# Patient Record
Sex: Female | Born: 1995 | Race: White | Hispanic: No | Marital: Married | Smoking: Never smoker
Health system: Southern US, Community
[De-identification: ages and names within clinical notes are randomized; demographics above are authoritative.]

## PROBLEM LIST (undated history)

## (undated) DIAGNOSIS — N189 Chronic kidney disease, unspecified: Secondary | ICD-10-CM

## (undated) DIAGNOSIS — D649 Anemia, unspecified: Secondary | ICD-10-CM

## (undated) HISTORY — DX: Chronic kidney disease, unspecified: N18.9

## (undated) HISTORY — PX: NO PAST SURGERIES: SHX2092

## (undated) HISTORY — DX: Anemia, unspecified: D64.9

---

## 2019-11-19 NOTE — L&D Delivery Note (Addendum)
OB/GYN Faculty Practice Delivery Note  Pamela Doyle is a 24 y.o. G1P1 s/p SVD @0045  at [redacted]w[redacted]d. She was admitted for PROM.   PROM: @2000  8/25 at patient home.  GBS Status: Negative Maximum Maternal Temperature: 98.65F  Labor Progress: Patient received labor augmentation with Pitocin. Progressed well. FHT with recurrent decels with pushing during contractions, but reassuringly rapid recovery between contractions with good variability.  Delivery Date/Time: 07/14/2020 at 0045 Delivery: Called to room and patient was complete and pushing. Head delivered ROA. No nuchal cord present. Shoulder and body delivered in usual fashion. Infant with spontaneous cry, placed on mother's abdomen, dried and stimulated. Cord clamped x 2 after 1-minute delay, and cut by FOB under my direct supervision. Cord blood drawn. Placenta delivered spontaneously with gentle cord traction. Fundus firm with massage and Pitocin. Labia, perineum, vagina, and cervix were inspected, no lacerations noted.   Placenta: Intact, 3-vessel cord Complications: None Lacerations: None EBL: 9/25 Analgesia: Epidural  Infant: boy  APGARs 7, 9  weight pending  07/16/2020 PGY-1 Family Medicine   Attestation of Supervision of Student:  I confirm that I have verified the information documented in the  resident's note and that I have also personally reperformed the history, physical exam and all medical decision making activities.  I have verified that all services and findings are accurately documented in this student's note; and I agree with management and plan as outlined in the documentation. I have also made any necessary editorial changes.  , MD Center for [facility], [facility] 07/14/2020 1:32 AM

## 2019-12-22 ENCOUNTER — Encounter: Payer: Self-pay | Admitting: Advanced Practice Midwife

## 2019-12-22 ENCOUNTER — Ambulatory Visit (INDEPENDENT_AMBULATORY_CARE_PROVIDER_SITE_OTHER): Payer: Medicaid Other | Admitting: Advanced Practice Midwife

## 2019-12-22 ENCOUNTER — Other Ambulatory Visit (HOSPITAL_COMMUNITY)
Admission: RE | Admit: 2019-12-22 | Discharge: 2019-12-22 | Disposition: A | Discharge status: Home / Self Care | Payer: Medicaid Other | Source: Ambulatory Visit | Attending: Advanced Practice Midwife | Admitting: Advanced Practice Midwife

## 2019-12-22 ENCOUNTER — Other Ambulatory Visit: Payer: Self-pay

## 2019-12-22 VITALS — BP 122/81 | HR 80 | Ht 68.0 in | Wt 195.0 lb

## 2019-12-22 DIAGNOSIS — Z3400 Encounter for supervision of normal first pregnancy, unspecified trimester: Secondary | ICD-10-CM

## 2019-12-22 DIAGNOSIS — Z3481 Encounter for supervision of other normal pregnancy, first trimester: Secondary | ICD-10-CM | POA: Diagnosis present

## 2019-12-22 DIAGNOSIS — Z0282 Encounter for adoption services: Secondary | ICD-10-CM

## 2019-12-22 DIAGNOSIS — Z3A11 11 weeks gestation of pregnancy: Secondary | ICD-10-CM

## 2019-12-22 DIAGNOSIS — Z3401 Encounter for supervision of normal first pregnancy, first trimester: Secondary | ICD-10-CM | POA: Diagnosis not present

## 2019-12-22 MED ORDER — BLOOD PRESSURE KIT: 1.0000 | PACK | 0 refills | Status: DC

## 2019-12-22 NOTE — Progress Notes (Signed)
History:   Allysen Lazo is a 24 y.o. G1P0 at [redacted]w[redacted]d by LMP c/w ultrasound in clinic today. She is being seen today for her first obstetrical visit.  No significant obstetric or gyn history. Patient does intend to breast feed. Pregnancy history fully reviewed.  Patient reports fatigue. 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 She is a non-smoker, husband is supportive, first pregnancy for both of them. Patient is adopted. Denies SI, HI, IPV   HISTORY: OB History  Gravida Para Term Preterm AB Living  1 0 0 0 0 0  SAB TAB Ectopic Multiple Live Births  0 0 0 0 0    # Outcome Date GA Lbr Len/2nd Weight Sex Delivery Anes PTL Lv  1 Current             Last pap smear was done 3 years ago and was normal  Past Medical History:  Diagnosis Date  . Chronic kidney disease    mal rotated kidneys   Past Surgical History:  Procedure Laterality Date  . NO PAST SURGERIES     Family History  Adopted: Yes   Social History   Tobacco Use  . Smoking status: Never Smoker  . Smokeless tobacco: Never Used  Substance Use Topics  . Alcohol use: Not Currently  . Drug use: Not Currently   No Known Allergies Current Outpatient Medications on File Prior to Visit  Medication Sig Dispense Refill  . Multiple Vitamins-Minerals (MULTIVITAMIN WITH MINERALS) tablet Take 1 tablet by mouth daily.    . Prenatal Vit-Fe Fumarate-FA (MULTIVITAMIN-PRENATAL) 27-0.8 MG TABS tablet Take 1 tablet by mouth daily at 12 noon.     No current facility-administered medications on file prior to visit.    Review of Systems Pertinent items noted in HPI and remainder of comprehensive ROS otherwise negative. Physical Exam:   Vitals:   12/22/19 1040 12/22/19 1041  BP: 122/81   Pulse: 80   Weight: 195 lb (88.5 kg)   Height:  5\' 8"  (1.727 m)   Fetal Heart Rate (bpm): 162 Uterus:     Pelvic Exam: Perineum: no hemorrhoids, normal perineum   Vulva: normal external genitalia, no lesions    Vagina:  normal mucosa, normal discharge   Cervix: no lesions and normal, pap smear done.    Adnexa: normal adnexa and no mass, fullness, tenderness   Bony Pelvis: average  System: General: well-developed, well-nourished female in no acute distress   Breasts:  normal appearance, no masses or tenderness bilaterally   Skin: normal coloration and turgor, no rashes   Neurologic: oriented, normal, negative, normal mood   Extremities: normal strength, tone, and muscle mass, ROM of all joints is normal   HEENT PERRLA, extraocular movement intact and sclera clear, anicteric   Mouth/Teeth mucous membranes moist, pharynx normal without lesions and dental hygiene good   Neck supple and no masses   Cardiovascular: regular rate and rhythm   Respiratory:  no respiratory distress, normal breath sounds   Abdomen: soft, non-tender; bowel sounds normal; no masses,  no organomegaly  Bedside Ultrasound for FHR check: Patient informed that the ultrasound is considered a limited obstetric ultrasound and is not intended to be a complete ultrasound exam.  Patient also informed that the ultrasound is not being completed with the intent of assessing for fetal or placental anomalies or any pelvic abnormalities.  Explained that the purpose of today's ultrasound is to assess for fetal heart rate.  Patient acknowledges the purpose  of the exam and the limitations of the study.     Assessment:    Pregnancy: G1P0 There are no problems to display for this patient.    Plan:    1. Encounter for supervision of other normal pregnancy in first trimester - Routine care - Obstetric Panel, Including HIV - Culture, OB Urine - Cytology - PAP - [place] MFM OB COMP + 14 WK; Future - Enroll Patient in Babyscripts - Babyscripts Schedule Optimization  2. Adopted - Genetic Screening  Initial labs drawn. Continue prenatal vitamins. Genetic Screening discussed, First trimester screen, Quad screen and NIPS: ordered. Ultrasound  discussed; fetal anatomic survey: ordered. Problem list reviewed and updated. The nature of [place] with multiple MDs and other Advanced Practice Providers was explained to patient; also emphasized that residents, students are part of our team. Routine obstetric precautions reviewed. Return in about 4 weeks (around 01/19/2020).     Mallie Snooks, MSN, CNM Certified Nurse Midwife, [facility] for [employer], [place] [facility] 12/22/19 12:33 PM

## 2019-12-22 NOTE — Progress Notes (Signed)
DATING AND VIABILITY SONOGRAM   Karolynn Infantino is a 24 y.o. year old G1P0 with LMP Patient's last menstrual period was 10/05/2019. which would correlate to  [redacted]w[redacted]d weeks gestation.  She has regular menstrual cycles.   She is here today for a confirmatory initial sonogram.    GESTATION: SINGLETON yes     FETAL ACTIVITY:          Heart rate   162          The fetus is active.     GESTATIONAL AGE AND  BIOMETRICS:  Gestational criteria: Estimated Date of Delivery: 07/11/20 by LMP now at [redacted]w[redacted]d  Previous Scans:0  GESTATIONAL SAC            mm          weeks  CROWN RUMP LENGTH           4.66 mm         11.3 weeks                                                   AVERAGE EGA(BY THIS SCAN): 11.3 weeks  WORKING EDD( LMP ):  07/11/2020     TECHNICIAN COMMENTS:  Patient informed that the ultrasound is considered a limited obstetric ultrasound and is not intended to be a complete ultrasound exam. Patient also informed that the ultrasound is not being completed with the intent of assessing for fetal or placental anomalies or any pelvic abnormalities. Explained that the purpose of today's ultrasound is to assess for fetal heart rate. Patient acknowledges the purpose of the exam and the limitations of the study.       A copy of this report including all images has been saved and backed up to a second source for retrieval if needed. All measures and details of the anatomical scan, placentation, fluid volume and pelvic anatomy are contained in that report.  Scheryl Marten 12/22/2019 10:49 AM

## 2019-12-22 NOTE — Progress Notes (Signed)
Last Pap 3 years ago

## 2019-12-22 NOTE — Patient Instructions (Signed)
First Trimester of Pregnancy  The first trimester of pregnancy is from week 1 until the end of week 13 (months 1 through 3). During this time, your baby will begin to develop inside you. At 6-8 weeks, the eyes and face are formed, and the heartbeat can be seen on ultrasound. At the end of 12 weeks, all the baby's organs are formed. Prenatal care is all the medical care you receive before the birth of your baby. Make sure you get good prenatal care and follow all of your doctor's instructions. Follow these instructions at home: Medicines  Take over-the-counter and prescription medicines only as told by your doctor. Some medicines are safe and some medicines are not safe during pregnancy.  Take a prenatal vitamin that contains at least 600 micrograms (mcg) of folic acid.  If you have trouble pooping (constipation), take medicine that will make your stool soft (stool softener) if your doctor approves. Eating and drinking   Eat regular, healthy meals.  Your doctor will tell you the amount of weight gain that is right for you.  Avoid raw meat and uncooked cheese.  If you feel sick to your stomach (nauseous) or throw up (vomit): ? Eat 4 or 5 small meals a day instead of 3 large meals. ? Try eating a few soda crackers. ? Drink liquids between meals instead of during meals.  To prevent constipation: ? Eat foods that are high in fiber, like fresh fruits and vegetables, whole grains, and beans. ? Drink enough fluids to keep your pee (urine) clear or pale yellow. Activity  Exercise only as told by your doctor. Stop exercising if you have cramps or pain in your lower belly (abdomen) or low back.  Do not exercise if it is too hot, too humid, or if you are in a place of great height (high altitude).  Try to avoid standing for long periods of time. Move your legs often if you must stand in one place for a long time.  Avoid heavy lifting.  Wear low-heeled shoes. Sit and stand up  straight.  You can have sex unless your doctor tells you not to. Relieving pain and discomfort  Wear a good support bra if your breasts are sore.  Take warm water baths (sitz baths) to soothe pain or discomfort caused by hemorrhoids. Use hemorrhoid cream if your doctor says it is okay.  Rest with your legs raised if you have leg cramps or low back pain.  If you have puffy, bulging veins (varicose veins) in your legs: ? Wear support hose or compression stockings as told by your doctor. ? Raise (elevate) your feet for 15 minutes, 3-4 times a day. ? Limit salt in your food. Prenatal care  Schedule your prenatal visits by the twelfth week of pregnancy.  Write down your questions. Take them to your prenatal visits.  Keep all your prenatal visits as told by your doctor. This is important. Safety  Wear your seat belt at all times when driving.  Make a list of emergency phone numbers. The list should include numbers for family, friends, the hospital, and police and fire departments. General instructions  Ask your doctor for a referral to a local prenatal class. Begin classes no later than at the start of month 6 of your pregnancy.  Ask for help if you need counseling or if you need help with nutrition. Your doctor can give you advice or tell you where to go for help.  Do not use hot tubs, steam   rooms, or saunas.  Do not douche or use tampons or scented sanitary pads.  Do not cross your legs for long periods of time.  Avoid all herbs and alcohol. Avoid drugs that are not approved by your doctor.  Do not use any tobacco products, including cigarettes, chewing tobacco, and electronic cigarettes. If you need help quitting, ask your doctor. You may get counseling or other support to help you quit.  Avoid cat litter boxes and soil used by cats. These carry germs that can cause birth defects in the baby and can cause a loss of your baby (miscarriage) or stillbirth.  Visit your dentist.  At home, brush your teeth with a soft toothbrush. Be gentle when you floss. Contact a doctor if:  You are dizzy.  You have mild cramps or pressure in your lower belly.  You have a nagging pain in your belly area.  You continue to feel sick to your stomach, you throw up, or you have watery poop (diarrhea).  You have a bad smelling fluid coming from your vagina.  You have pain when you pee (urinate).  You have increased puffiness (swelling) in your face, hands, legs, or ankles. Get help right away if:  You have a fever.  You are leaking fluid from your vagina.  You have spotting or bleeding from your vagina.  You have very bad belly cramping or pain.  You gain or lose weight rapidly.  You throw up blood. It may look like coffee grounds.  You are around people who have [place] measles, fifth disease, or chickenpox.  You have a very bad headache.  You have shortness of breath.  You have any kind of trauma, such as from a fall or a car accident. Summary  The first trimester of pregnancy is from week 1 until the end of week 13 (months 1 through 3).  To take care of yourself and your unborn baby, you will need to eat healthy meals, take medicines only if your doctor tells you to do so, and do activities that are safe for you and your baby.  Keep all follow-up visits as told by your doctor. This is important as your doctor will have to ensure that your baby is healthy and growing well. This information is not intended to replace advice given to you by your health care provider. Make sure you discuss any questions you have with your health care provider. Document Revised: 02/25/2019 Document Reviewed: 11/12/2016 Elsevier Patient Education  2020 [employer].   Second Trimester of Pregnancy  The second trimester is from week 14 through week 27 (month 4 through 6). This is often the time in pregnancy that you feel your best. Often times, morning sickness has lessened or quit.  You may have more energy, and you may get hungry more often. Your unborn baby is growing rapidly. At the end of the sixth month, he or she is about 9 inches long and weighs about 1 pounds. You will likely feel the baby move between 18 and 20 weeks of pregnancy. Follow these instructions at home: Medicines  Take over-the-counter and prescription medicines only as told by your doctor. Some medicines are safe and some medicines are not safe during pregnancy.  Take a prenatal vitamin that contains at least 600 micrograms (mcg) of folic acid.  If you have trouble pooping (constipation), take medicine that will make your stool soft (stool softener) if your doctor approves. Eating and drinking   Eat regular, healthy meals.  Avoid  raw meat and uncooked cheese.  If you get low calcium from the food you eat, talk to your doctor about taking a daily calcium supplement.  Avoid foods that are high in fat and sugars, such as fried and sweet foods.  If you feel sick to your stomach (nauseous) or throw up (vomit): ? Eat 4 or 5 small meals a day instead of 3 large meals. ? Try eating a few soda crackers. ? Drink liquids between meals instead of during meals.  To prevent constipation: ? Eat foods that are high in fiber, like fresh fruits and vegetables, whole grains, and beans. ? Drink enough fluids to keep your pee (urine) clear or pale yellow. Activity  Exercise only as told by your doctor. Stop exercising if you start to have cramps.  Do not exercise if it is too hot, too humid, or if you are in a place of great height (high altitude).  Avoid heavy lifting.  Wear low-heeled shoes. Sit and stand up straight.  You can continue to have sex unless your doctor tells you not to. Relieving pain and discomfort  Wear a good support bra if your breasts are tender.  Take warm water baths (sitz baths) to soothe pain or discomfort caused by hemorrhoids. Use hemorrhoid cream if your doctor  approves.  Rest with your legs raised if you have leg cramps or low back pain.  If you develop puffy, bulging veins (varicose veins) in your legs: ? Wear support hose or compression stockings as told by your doctor. ? Raise (elevate) your feet for 15 minutes, 3-4 times a day. ? Limit salt in your food. Prenatal care  Write down your questions. Take them to your prenatal visits.  Keep all your prenatal visits as told by your doctor. This is important. Safety  Wear your seat belt when driving.  Make a list of emergency phone numbers, including numbers for family, friends, the hospital, and police and fire departments. General instructions  Ask your doctor about the right foods to eat or for help finding a counselor, if you need these services.  Ask your doctor about local prenatal classes. Begin classes before month 6 of your pregnancy.  Do not use hot tubs, steam rooms, or saunas.  Do not douche or use tampons or scented sanitary pads.  Do not cross your legs for long periods of time.  Visit your dentist if you have not done so. Use a soft toothbrush to brush your teeth. Floss gently.  Avoid all smoking, herbs, and alcohol. Avoid drugs that are not approved by your doctor.  Do not use any products that contain nicotine or tobacco, such as cigarettes and e-cigarettes. If you need help quitting, ask your doctor.  Avoid cat litter boxes and soil used by cats. These carry germs that can cause birth defects in the baby and can cause a loss of your baby (miscarriage) or stillbirth. Contact a doctor if:  You have mild cramps or pressure in your lower belly.  You have pain when you pee (urinate).  You have bad smelling fluid coming from your vagina.  You continue to feel sick to your stomach (nauseous), throw up (vomit), or have watery poop (diarrhea).  You have a nagging pain in your belly area.  You feel dizzy. Get help right away if:  You have a fever.  You are leaking  fluid from your vagina.  You have spotting or bleeding from your vagina.  You have severe belly cramping or pain.  You lose or gain weight rapidly.  You have trouble catching your breath and have chest pain.  You notice sudden or extreme puffiness (swelling) of your face, hands, ankles, feet, or legs.  You have not felt the baby move in over an hour.  You have severe headaches that do not go away when you take medicine.  You have trouble seeing. Summary  The second trimester is from week 14 through week 27 (months 4 through 6). This is often the time in pregnancy that you feel your best.  To take care of yourself and your unborn baby, you will need to eat healthy meals, take medicines only if your doctor tells you to do so, and do activities that are safe for you and your baby.  Call your doctor if you get sick or if you notice anything unusual about your pregnancy. Also, call your doctor if you need help with the right food to eat, or if you want to know what activities are safe for you. This information is not intended to replace advice given to you by your health care provider. Make sure you discuss any questions you have with your health care provider. Document Revised: 02/26/2019 Document Reviewed: 12/10/2016 Elsevier Patient Education  [place] Medications in Pregnancy   Acne: Benzoyl Peroxide Salicylic Acid  Backache/Headache: Tylenol: 2 regular strength every 4 hours OR              2 Extra strength every 6 hours  Colds/Coughs/Allergies: Benadryl (alcohol free) 25 mg every 6 hours as needed Breath right strips Claritin Cepacol throat lozenges Chloraseptic throat spray Cold-Eeze- up to three times per day Cough drops, alcohol free Flonase (by prescription only) Guaifenesin Mucinex Robitussin DM (plain only, alcohol free) Saline nasal spray/drops Sudafed (pseudoephedrine) & Actifed ** use only after [redacted] weeks gestation and if you do not have  high blood pressure Tylenol Vicks Vaporub Zinc lozenges Zyrtec   Constipation: Colace Ducolax suppositories Fleet enema Glycerin suppositories Metamucil Milk of magnesia Miralax Senokot Smooth move tea  Diarrhea: Kaopectate Imodium A-D  *NO pepto Bismol  Hemorrhoids: Anusol Anusol HC Preparation H Tucks  Indigestion: Tums Maalox Mylanta Zantac  Pepcid  Insomnia: Benadryl (alcohol free) 25mg  every 6 hours as needed Tylenol PM Unisom, no Gelcaps  Leg Cramps: Tums MagGel  Nausea/Vomiting:  Bonine Dramamine Emetrol Ginger extract Sea bands Meclizine  Nausea medication to take during pregnancy:  Unisom (doxylamine succinate 25 mg tablets) Take one tablet daily at bedtime. If symptoms are not adequately controlled, the dose can be increased to a maximum recommended dose of two tablets daily (1/2 tablet in the morning, 1/2 tablet mid-afternoon and one at bedtime). Vitamin B6 100mg  tablets. Take one tablet twice a day (up to 200 mg per day).  Skin Rashes: Aveeno products Benadryl cream or 25mg  every 6 hours as needed Calamine Lotion 1% cortisone cream  Yeast infection: Gyne-lotrimin 7 Monistat 7   **If taking multiple medications, please check labels to avoid duplicating the same active ingredients **take medication as directed on the label ** Do not exceed 4000 mg of tylenol in 24 hours **Do not take medications that contain aspirin or ibuprofen

## 2019-12-23 ENCOUNTER — Encounter: Payer: Self-pay | Admitting: Advanced Practice Midwife

## 2019-12-23 DIAGNOSIS — Z3400 Encounter for supervision of normal first pregnancy, unspecified trimester: Secondary | ICD-10-CM | POA: Insufficient documentation

## 2019-12-23 DIAGNOSIS — R768 Other specified abnormal immunological findings in serum: Secondary | ICD-10-CM

## 2019-12-23 HISTORY — DX: Other specified abnormal immunological findings in serum: R76.8

## 2019-12-23 LAB — OBSTETRIC PANEL, INCLUDING HIV
Antibody Screen: NEGATIVE
Basophils Absolute: 0 10*3/uL (ref 0.0–0.2)
Basos: 1 %
EOS (ABSOLUTE): 0.1 10*3/uL (ref 0.0–0.4)
Eos: 1 %
HIV Screen 4th Generation wRfx: NONREACTIVE
Hematocrit: 37.9 % (ref 34.0–46.6)
Hemoglobin: 13.4 g/dL (ref 11.1–15.9)
Hepatitis B Surface Ag: NEGATIVE
Immature Grans (Abs): 0 10*3/uL (ref 0.0–0.1)
Immature Granulocytes: 0 %
Lymphocytes Absolute: 1.3 10*3/uL (ref 0.7–3.1)
Lymphs: 17 %
MCH: 31.8 pg (ref 26.6–33.0)
MCHC: 35.4 g/dL (ref 31.5–35.7)
MCV: 90 fL (ref 79–97)
Monocytes Absolute: 0.5 10*3/uL (ref 0.1–0.9)
Monocytes: 6 %
Neutrophils Absolute: 5.8 10*3/uL (ref 1.4–7.0)
Neutrophils: 75 %
Platelets: 148 10*3/uL — ABNORMAL LOW (ref 150–450)
RBC: 4.22 x10E6/uL (ref 3.77–5.28)
RDW: 12.1 % (ref 11.7–15.4)
RPR Ser Ql: REACTIVE — AB
Rh Factor: POSITIVE
Rubella Antibodies, IGG: 1.04 index (ref >0.99)
WBC: 7.7 10*3/uL (ref 3.4–10.8)

## 2019-12-23 LAB — CYTOLOGY - PAP
Chlamydia: NEGATIVE
Comment: NEGATIVE
Comment: NORMAL
Diagnosis: NEGATIVE
Neisseria Gonorrhea: NEGATIVE

## 2019-12-23 LAB — RPR, QUANT+TP ABS (REFLEX)
Rapid Plasma Reagin, Quant: 1:1 {titer} — ABNORMAL HIGH
T Pallidum Abs: NONREACTIVE

## 2019-12-26 LAB — URINE CULTURE, OB REFLEX

## 2019-12-26 LAB — CULTURE, OB URINE

## 2020-01-03 ENCOUNTER — Telehealth: Payer: Self-pay | Admitting: Radiology

## 2020-01-03 ENCOUNTER — Encounter: Payer: Self-pay | Admitting: Radiology

## 2020-01-03 NOTE — Telephone Encounter (Signed)
Patient was informed of panorama results 

## 2020-01-17 ENCOUNTER — Telehealth: Payer: Self-pay

## 2020-01-17 ENCOUNTER — Telehealth: Payer: Self-pay | Admitting: *Deleted

## 2020-01-17 ENCOUNTER — Encounter: Payer: Self-pay | Admitting: *Deleted

## 2020-01-17 DIAGNOSIS — Z3481 Encounter for supervision of other normal pregnancy, first trimester: Secondary | ICD-10-CM

## 2020-01-17 NOTE — Telephone Encounter (Signed)
Pt informed of carrier screen results, order placed for partner to get tested and pt okay with referral to genetic counselor.

## 2020-01-17 NOTE — Telephone Encounter (Signed)
Left message to call back concerning horizon

## 2020-01-19 ENCOUNTER — Ambulatory Visit (INDEPENDENT_AMBULATORY_CARE_PROVIDER_SITE_OTHER): Payer: Medicaid Other | Admitting: Advanced Practice Midwife

## 2020-01-19 ENCOUNTER — Other Ambulatory Visit: Payer: Self-pay

## 2020-01-19 VITALS — BP 121/78 | HR 72 | Wt 191.2 lb

## 2020-01-19 DIAGNOSIS — O285 Abnormal chromosomal and genetic finding on antenatal screening of mother: Secondary | ICD-10-CM

## 2020-01-19 DIAGNOSIS — Z3400 Encounter for supervision of normal first pregnancy, unspecified trimester: Secondary | ICD-10-CM

## 2020-01-19 NOTE — Progress Notes (Signed)
   PRENATAL VISIT NOTE  Subjective:  Pamela Doyle is a 24 y.o. G1P0 at [redacted]w[redacted]d being seen today for ongoing prenatal care.  She is currently monitored for the following issues for this low-risk pregnancy and has Supervision of normal first pregnancy, antepartum; Biological false positive RPR test; and Abnormal genetic test during pregnancy on their problem list.  Patient reports no complaints.  Contractions: Not present.  .  Movement: Absent. Denies leaking of fluid. Patient has questions about her weight fluctuations. She is monitoring her weight from multiple scales and one of those scales indicates thirteen pound weight loss. She denies nausea, vomiting, food intolerance, change in PO intake.  The following portions of the patient's history were reviewed and updated as appropriate: allergies, current medications, past family history, past medical history, past social history, past surgical history and problem list. Problem list updated.  Objective:   Vitals:   01/19/20 0915  BP: 121/78  Pulse: 72  Weight: 86.7 kg    Fetal Status: Fetal Heart Rate (bpm): 154   Movement: Absent     General:  Alert, oriented and cooperative. Patient is in no acute distress.  Skin: Skin is warm and dry. No rash noted.   Cardiovascular: Normal heart rate noted  Respiratory: Normal respiratory effort, no problems with respiration noted  Abdomen: Soft, gravid, appropriate for gestational age.  Pain/Pressure: Absent     Pelvic: Cervical exam deferred        Extremities: Normal range of motion.     Mental Status: Normal mood and affect. Normal behavior. Normal judgment and thought content.   Assessment and Plan:  Pregnancy: G1P0 at [redacted]w[redacted]d  1. Supervision of normal first pregnancy, antepartum - Routine care - 2kg loss from previous visit  2. Abnormal genetic test during pregnancy - Referred to genetic counselor - Reassured Pamela Doyle will assist with concern about out of pocket cost for partner carrier  screening  Preterm labor symptoms and general obstetric precautions including but not limited to vaginal bleeding, contractions, leaking of fluid and fetal movement were reviewed in detail with the patient. Please refer to After Visit Summary for other counseling recommendations.  Return in about 4 weeks (around 02/16/2020) for Virtual.  Future Appointments  Date Time Provider Department Center  02/15/2020  9:30 AM [facility] [place] 1 [facility]  02/16/2020  9:30 AM Calvert Cantor, CNM [facility]    Calvert Cantor, [place]

## 2020-01-19 NOTE — Patient Instructions (Signed)

## 2020-02-15 ENCOUNTER — Other Ambulatory Visit: Payer: Self-pay

## 2020-02-15 ENCOUNTER — Other Ambulatory Visit: Payer: Self-pay | Admitting: Advanced Practice Midwife

## 2020-02-15 ENCOUNTER — Ambulatory Visit (HOSPITAL_COMMUNITY)
Admission: RE | Admit: 2020-02-15 | Discharge: 2020-02-15 | Disposition: A | Discharge status: Home / Self Care | Payer: Medicaid Other | Source: Ambulatory Visit | Attending: Obstetrics and Gynecology | Admitting: Obstetrics and Gynecology

## 2020-02-15 ENCOUNTER — Other Ambulatory Visit (HOSPITAL_COMMUNITY): Payer: Self-pay | Admitting: *Deleted

## 2020-02-15 DIAGNOSIS — Z3481 Encounter for supervision of other normal pregnancy, first trimester: Secondary | ICD-10-CM

## 2020-02-15 DIAGNOSIS — Z141 Cystic fibrosis carrier: Secondary | ICD-10-CM

## 2020-02-15 DIAGNOSIS — Z3A19 19 weeks gestation of pregnancy: Secondary | ICD-10-CM

## 2020-02-15 DIAGNOSIS — Z363 Encounter for antenatal screening for malformations: Secondary | ICD-10-CM

## 2020-02-16 ENCOUNTER — Telehealth (INDEPENDENT_AMBULATORY_CARE_PROVIDER_SITE_OTHER): Payer: Medicaid Other | Admitting: Advanced Practice Midwife

## 2020-02-16 VITALS — BP 123/72

## 2020-02-16 DIAGNOSIS — Z3A19 19 weeks gestation of pregnancy: Secondary | ICD-10-CM

## 2020-02-16 DIAGNOSIS — O285 Abnormal chromosomal and genetic finding on antenatal screening of mother: Secondary | ICD-10-CM

## 2020-02-16 DIAGNOSIS — O99612 Diseases of the digestive system complicating pregnancy, second trimester: Secondary | ICD-10-CM

## 2020-02-16 DIAGNOSIS — Z3400 Encounter for supervision of normal first pregnancy, unspecified trimester: Secondary | ICD-10-CM

## 2020-02-16 DIAGNOSIS — K649 Unspecified hemorrhoids: Secondary | ICD-10-CM

## 2020-02-16 MED ORDER — WITCH HAZEL-GLYCERIN EX PADS: 1.0000 "application " | MEDICATED_PAD | CUTANEOUS | 12 refills | Status: DC | PRN

## 2020-02-16 MED ORDER — POLYETHYLENE GLYCOL 3350 17 G PO PACK: 17.0000 g | PACK | Freq: Every day | ORAL | 0 refills | Status: DC

## 2020-02-16 MED ORDER — DOCUSATE SODIUM 100 MG PO CAPS: 100.0000 mg | ORAL_CAPSULE | Freq: Two times a day (BID) | ORAL | 2 refills | Status: DC | PRN

## 2020-02-16 NOTE — Progress Notes (Signed)
I connected with  Pamela Doyle on 02/16/20 at  9:30 AM EDT by telephone and verified that I am speaking with the correct person using two identifiers.   I discussed the limitations, risks, security and privacy concerns of performing an evaluation and management service by telephone and the availability of in person appointments. I also discussed with the patient that there may be a patient responsible charge related to this service. The patient expressed understanding and agreed to proceed.  Scheryl Marten, RN 02/16/2020  9:28 AM

## 2020-02-16 NOTE — Progress Notes (Signed)
OBSTETRICS PRENATAL VIRTUAL VISIT ENCOUNTER NOTE  Provider location: [facility]   I connected with Pamela Doyle on 02/16/20 at  9:30 AM EDT by MyChart Video Encounter at home and verified that I am speaking with the correct person using two identifiers.   I discussed the limitations, risks, security and privacy concerns of performing an evaluation and management service virtually and the availability of in person appointments. I also discussed with the patient that there may be a patient responsible charge related to this service. The patient expressed understanding and agreed to proceed. Subjective:  Pamela Doyle is a 24 y.o. G1P0 at [redacted]w[redacted]d being seen today for ongoing prenatal care.  She is currently monitored for the following issues for this low-risk pregnancy and has Supervision of normal first pregnancy, antepartum; Biological false positive RPR test; and Abnormal genetic test during pregnancy on their problem list.  Patient reports new onset hemorrhoids with one episode of bleeding while wiping after a bowel movement..  Contractions: Not present. Vag. Bleeding: None. Denies any leaking of fluid.   The following portions of the patient's history were reviewed and updated as appropriate: allergies, current medications, past family history, past medical history, past social history, past surgical history and problem list.   Objective:   Vitals:   02/16/20 0924  BP: 123/72    General:  Alert, oriented and cooperative. Patient is in no acute distress.  Respiratory: Normal respiratory effort, no problems with respiration noted  Mental Status: Normal mood and affect. Normal behavior. Normal judgment and thought content.  Rest of physical exam deferred due to type of encounter  Imaging: [place] MFM OB DETAIL +14 WK  Result Date: 02/15/2020 ----------------------------------------------------------------------  OBSTETRICS REPORT                        (Signed Final 02/15/2020 01:00 pm) ---------------------------------------------------------------------- Patient Info  ID #:       [id number]                          D.O.B.:  [birth year] (24 yrs)  Name:       Pamela Doyle                 Visit Date: 02/15/2020 09:37 am ---------------------------------------------------------------------- Performed By  Performed By:     Tommie Raymond BS,       Ref. Address:     [address]                    [address]                                                             [address]  Attending:        Noralee Space MD        [facility]  Referred By:      [facility] ---------------------------------------------------------------------- Orders   #  Description                          Code         Ordered By   1  US MFM OB DETAIL +14 WK              [id number]     Clayton BiblesSAMANTHA                                                        Nickolette Espinola  ----------------------------------------------------------------------   #  Order #                    Accession #                 Episode #   1  [id number]                  [id number]                  [id number]  ---------------------------------------------------------------------- Indications   [redacted] weeks gestation of pregnancy                Z3A.19   Encounter for antenatal screening for          Z36.3   malformations   Cystic Fibrosis (CF) Carrier, second trimester O09.892  ---------------------------------------------------------------------- Fetal Evaluation  Num Of Fetuses:         1  Fetal Heart Rate(bpm):  151  Cardiac Activity:       Observed  Presentation:           Cephalic  Placenta:               Anterior  P. Cord Insertion:      Visualized, central  Amniotic Fluid  AFI FV:      Within normal limits                              Largest Pocket(cm)                              7.3  ---------------------------------------------------------------------- Biometry  BPD:      44.8  mm     G. Age:  19w 4d         74  %    CI:        78.27   %    70 - 86                                                          FL/HC:      17.4   %    16.1 - 18.3  HC:      160.2  mm     G. Age:  18w 6d         35  %    HC/AC:      1.06  1.09 - 1.39  AC:      150.9  mm     G. Age:  20w 2d         85  %    FL/BPD:     62.1   %  FL:       27.8  mm     G. Age:  18w 4d         26  %    FL/AC:      18.4   %    20 - 24  HUM:      28.3  mm     G. Age:  19w 1d         53  %  CER:      19.5  mm     G. Age:  18w 5d         43  %  NFT:       3.7  mm  LV:        7.8  mm  CM:        5.8  mm  Est. FW:     292  gm    0 lb 10 oz      71  % ---------------------------------------------------------------------- OB History  Blood Type:    B+  Gravidity:    1         Term:   0        Prem:   0        SAB:   0  TOP:          0       Ectopic:  0        Living: 0 ---------------------------------------------------------------------- Gestational Age  LMP:           19w 0d        Date:  10/05/19                 EDD:   07/11/20  U/S Today:     19w 2d                                        EDD:   07/09/20  Best:          19w 0d     Det. By:  LMP  (10/05/19)          EDD:   07/11/20 ---------------------------------------------------------------------- Anatomy  Cranium:               Appears normal         LVOT:                   Appears normal  Cavum:                 Appears normal         Aortic Arch:            Not well visualized  Ventricles:            Appears normal         Ductal Arch:            Not well visualized  Choroid Plexus:        Appears normal         Diaphragm:              Appears normal  Cerebellum:  Appears normal         Stomach:                Appears normal, left                                                                        sided  Posterior Fossa:       Appears normal         Abdomen:                 Appears normal  Nuchal Fold:           Appears normal         Abdominal Wall:         Appears nml (cord                                                                        insert, abd wall)  Face:                  Appears normal         Cord Vessels:           Appears normal (3                         (orbits and profile)                           vessel cord)  Lips:                  Appears normal         Kidneys:                Appear normal  Palate:                Not well visualized    Bladder:                Appears normal  Thoracic:              Appears normal         Spine:                  Not well visualized  Heart:                 Appears normal         Upper Extremities:      Appears normal                         (4CH, axis, and                         situs)  RVOT:                  Not well visualized    Lower Extremities:  Appears normal  Other:  Female gender. Heels and Left 5th digit visualized. Technically difficult          due to fetal position. ---------------------------------------------------------------------- Cervix Uterus Adnexa  Cervix  Length:           3.44  cm.  Normal appearance by transabdominal scan.  Uterus  No abnormality visualized.  Left Ovary  Within normal limits. No adnexal mass visualized.  Right Ovary  Within normal limits. No adnexal mass visualized.  Cul De Sac  No free fluid seen.  Adnexa  No abnormality visualized. ---------------------------------------------------------------------- Impression  Ms. Blackwell, G1, P0, is here for fetal anatomy scan.  On cell  free fetal DNA screening, the risks of fetal aneuploidies are  not increased.  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  Her partner has not  been screened yet.  Patient had spoken with the thyroid  genetic counselor and is aware of the cost involved for  partner screening.  We performed fetal anatomy scan. No makers of  aneuploidies or fetal structural defects  are seen. Fetal  biometry is consistent with her previously-established dates.  Amniotic fluid is normal and good fetal activity is seen.  Patient understands the limitations of ultrasound in detecting  fetal anomalies.  We reassured the patient of ultrasound findings.  I briefly discussed the significance of carrier screening results  and encouraged her to meet with our genetic counselor. ---------------------------------------------------------------------- Recommendations  -Genetic counseling appointment next week.  -An appointment was made for her to return in 4 weeks for  completion of fetal anatomy. ----------------------------------------------------------------------                  Noralee Space, MD Electronically Signed Final Report   02/15/2020 01:00 pm ----------------------------------------------------------------------   Assessment and Plan:  Pregnancy: G1P0 at [redacted]w[redacted]d 1. Supervision of normal first pregnancy, antepartum - Routine care  2. Hemorrhoids, unspecified hemorrhoid type - docusate sodium (COLACE) 100 MG capsule; Take 1 capsule (100 mg total) by mouth 2 (two) times daily as needed.  Dispense: 30 capsule; Refill: 2 - polyethylene glycol (MIRALAX) 17 g packet; Take 17 g by mouth daily.  Dispense: 14 each; Refill: 0 - witch hazel-glycerin (TUCKS) pad; Apply 1 application topically as needed for itching or hemorrhoids.  Dispense: 40 each; Refill: 12  3. Abnormal genetic test during pregnancy - MFM appointment for genetic counseling 02/23/2020 - FOB has found lower cost option for genetic screening and is pursuing  Preterm labor symptoms and general obstetric precautions including but not limited to vaginal bleeding, contractions, leaking of fluid and fetal movement were reviewed in detail with the patient. I discussed the assessment and treatment plan with the patient. The patient was provided an opportunity to ask questions and all were answered. The patient agreed with the plan  and demonstrated an understanding of the instructions. The patient was advised to call back or seek an in-person office evaluation/go to [facility] at [facility] for any urgent or concerning symptoms. Please refer to After Visit Summary for other counseling recommendations.   I provided ten minutes of face-to-face time during this encounter.   Future Appointments  Date Time Provider Department Center  02/23/2020  1:00 PM [facility] GENETIC COUNSELING RM [facility]-US  03/14/2020 10:00 AM [facility]FC [place] 3 [facility]-MFCUS MFC-US  03/14/2020  2:00 PM Reva Bores, MD [facility]    Calvert Cantor, CNM [facility] for [employer], [facility]

## 2020-02-23 ENCOUNTER — Other Ambulatory Visit: Payer: Self-pay

## 2020-02-23 ENCOUNTER — Encounter: Payer: Self-pay | Admitting: *Deleted

## 2020-02-23 ENCOUNTER — Ambulatory Visit (HOSPITAL_COMMUNITY): Payer: Medicaid Other | Attending: Obstetrics and Gynecology | Admitting: Genetic Counselor

## 2020-02-23 ENCOUNTER — Ambulatory Visit (HOSPITAL_COMMUNITY): Payer: Self-pay | Admitting: Advanced Practice Midwife

## 2020-02-23 DIAGNOSIS — Z315 Encounter for genetic counseling: Secondary | ICD-10-CM | POA: Diagnosis not present

## 2020-02-23 DIAGNOSIS — Z3A20 20 weeks gestation of pregnancy: Secondary | ICD-10-CM | POA: Diagnosis not present

## 2020-02-23 DIAGNOSIS — Z141 Cystic fibrosis carrier: Secondary | ICD-10-CM | POA: Diagnosis not present

## 2020-02-23 NOTE — Progress Notes (Signed)
02/23/2020  Pamela Doyle November 04, 1996 MRN: [id number] DOV: 02/23/2020  Pamela Doyle presented to the [facility] for a genetics consultation regarding her carrier status for cystic fibrosis. Pamela Doyle came to her appointment alone due to COVID-19 visitor restrictions.   Indication for genetic counseling - Carrier for cystic fibrosis  Prenatal history  Pamela Doyle is a G43P0, 24 y.o. female. Her current pregnancy has completed [redacted]w[redacted]d(Estimated Date of Delivery: 07/11/20).  Ms. BPhilipp Ovensdenied exposure to environmental toxins or chemical agents. She denied the use of alcohol, tobacco or street drugs. She reported taking prenatal vitamins. She denied significant viral illnesses, fevers, and bleeding during the course of her pregnancy. Her medical history was noncontributory.  Family History  A three generation pedigree was drafted and reviewed. The family history is remarkable for the following:  - Ms. BPhilipp Ovenswas adopted by her biological maternal uncle. She has several biological maternal half siblings who are all healthy. She has limited information about her biological father; thus, risk assessment is limited.  - Ms. BPhilipp Ovensreported that her partner's mother has "respiratory problems". Ms. BPhilipp Ovenshad limited information about this history, and records are not available. For this reason, risk assessment is limited.  The remaining family histories were reviewed and found to be noncontributory for birth defects, intellectual disability, recurrent pregnancy loss, and known genetic conditions.    The patient's [place] is [place] The father of the pregnancy's ethnicity is [place] Ashkenazi Jewish ancestry and consanguinity were denied. Pedigree will be scanned under Media.  Discussion  Ms. 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   Cystic fibrosis (CF) is a condition typically characterized by the buildup of thick, sticky mucus that can damage the body's organs. Mucus is a slippery substance that lubricates and protects the linings of the airways, digestive system, reproductive system, and other organs and tissues. Individuals with CF have abnormally sticky mucus that can clog the airways and digestive system, leading to progressive damage to the respiratory system and chronic digestive system problems. The most common features of CF include respiratory difficulties, bacterial infections in the lungs, the formation of scar tissue (fibrosis) and cysts in the lungs, pancreatic insufficiency, CF-related diabetes mellitus, diarrhea, malnutrition, poor growth, and weight loss. Most men with CF have congenital bilateral absence of the vas deferens (CBAVD) which causes female infertility. CF is variably expressed, meaning features of the condition and their severity vary among affected individuals. Expression and severity of CF depends upon the specific mutations present in an affected individual.   CF-related disorders are caused by mutations in the CFTR gene. This gene provides instructions for a channel that transports chloride ions into and out of cells. The flow of chloride ions helps control the movement of water in the body's tissues, which is necessary for the production of thin, freely flowing mucus. Pathogenic variants in the CFTR gene disrupt the function of the chloride channels, preventing them from regulating the flow of chloride ions and water across cell membranes. CF-related disorders are inherited in an autosomal recessive fashion. This means that the current fetus is only at risk for CF-related disorders if Pamela Doyle's partner is also a carrier for the condition. Based on the carrier frequency for CF in the Caucasian population, Pamela Doyle has a 1 in 25 chance of  being a carrier for CF. Thus,  the couple currently has a 1 in 100 (1%) chance of having a child with CF. If Pamela Doyle's partner is also identified to be a carrier, the risk for CF or a CF-related disorder in the current fetus would be 1 in 4 (25%).   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 R117H is a specific variant in the CFTR gene that is known to produce a CFTR protein with reduced chloride transport. It is generally considered to be a mild CF variant. When combined with another CF-causing variant, it is associated with a broad phenotype, ranging from CF with suppurative lung disease to no clinical disease. A polythymidine (poly T) tract, comprising 5 (5T), 7 (7T), or 9 (9T) repeats in intron 8 of the CFTR gene influences the amount of aberrant CFTR protein that is produced. Fewer poly T repeats generally result in the production of a greater amount of aberrant CFTR protein. For this reason, sequencing the number of poly T repeats is recommended when an R117H variant is detected in an individual.   Generally, in order for the R117H variant to function as a classic CF-causing variant, it must occur in cis (on the same chromosome) with a 5T variant, which produces the highest proportion of non-functional CFTR protein. When R117H-5T occurs on one chromosome and is coupled with a classic CF mutation such as ?F508 on the other chromosome, a child may have an elevated or borderline sweat test, mild to moderate lung disease, pancreatitis, and female infertility (i.e., pancreatic sufficient CF). By contrast, when R117H is in trans (on the opposite chromosome) with 5T, or in cis or trans with 7T or 9T, it acts as a mild CF variant. Pamela Doyle carries a 7T variant in both cis and trans with her R117H variant (7T/7T). 7T/7T is the most frequent genotype associated with the R117H variant in the [employer] population.   If Pamela Doyle's partner were to carry a  classic CF-causing variant, a variable phenotype would be possible in their children who inherited both parental CFTR variants, including normal or borderline sweat tests, late-onset/pancreatic sufficient CF, CBAVD alone, or no disease at all. If her partner were to carry a mild CFTR variant, a variable phenotype ranging from mild CF symptoms to no disease would be possible for their children who inherited both parental CFTR variants. To clarify possible risks to the current fetus, carrier testing for CF is recommended for Pamela Doyle's partner. Pamela Doyle indicated that she is interested in pursuing partner carrier screening.   We also discussed that newborn screening performed for all infants in [place] assesses for CF after birth. CFTR-related metabolic syndrome (CRMS) describes an inconclusive CF diagnosis following newborn screening, and may be possible based on Pamela Doyle's carrier screening result. Children with CRMS may later be conclusively diagnosed with CF or conclusively diagnosed as not having CF. If a diagnosis cannot be resolved, a child may be considered to have a CF-related disorder. Individuals with a CF-related disorder may have certain features of CF, but do not fulfill diagnostic criteria for CF.  Pamela Doyle carrier screening was negative for the other 13 conditions screened. Thus, her risk to be a carrier for these additional conditions (listed separately in the laboratory report) has been reduced but not eliminated. This also significantly reduces her risk of having a child affected by one of these conditions.   We discussed the recommendationthatMs. Blackwellinform herbiological siblings about her CF carrier status. If Pamela Doyle  inherited her CFTR variant from her biological mother, each of Pamela Doyle's maternal half siblings would have a 50% chance of being a carrier for CF themselves. Although they are still young, Pamela Doyle siblings and their  reproductive partners may consider carrier screening for CF either during the preconception period or during pregnanciesin the future to refine their chance of having a child affected by CF.  We also reviewed that Pamela Doyle had Panorama NIPS through the laboratory [facility] that was low-risk for fetal aneuploidies. We reviewed that these results showed a less than 1 in 10,000 risk for trisomies 21, 18 and 13, and monosomy X (Turner syndrome).  In addition, the risk for triploidy and sex chromosome trisomies (47,XXX and 47,XXY) was also low. Pamela Doyle elected to have cfDNA analysis for 22q11.2 deletion syndrome, which was also low risk (1 in 2900). We reviewed that while this testing identifies 94-99% of pregnancies with trisomy 35, trisomy 53, trisomy 49, sex chromosome aneuploidies, and triploidy, it is NOT diagnostic. A positive test result requires confirmation by CVS or amniocentesis, and a negative test result does not rule out a fetal chromosome abnormality. She also understands that this testing does not identify all genetic conditions.  Pamela Doyle was also counseled regarding diagnostic testing via amniocentesis. We discussed the technical aspects of the procedure and quoted up to a 1 in 500 (0.2%) risk for spontaneous pregnancy loss or other adverse pregnancy outcomes as a result of amniocentesis. Cultured cells from an amniocentesis sample allow for the visualization of a fetal karyotype, which can detect >99% of chromosomal aberrations. Chromosomal microarray can also be performed to identify smaller deletions or duplications of fetal chromosomal material. Amniocentesis could also be performed to assess whether the baby is affected by CF. After careful consideration, Pamela Doyle declined amniocentesis at this time. She understands that amniocentesis is available at any point after 16 weeks of pregnancy and that she may opt to undergo the procedure at a later date should she change her  mind.  Lastly, screening for open neural tube defects (ONTDs) via MS-AFP in the second trimester in addition to level II ultrasound examination is recommended. Pamela Doyle level II ultrasound did not detect any ONTDs. Level II ultrasound is able to detect ONTDs with 90-95% sensitivity. However, normal results from level II ultrasound and MS-AFP screening do not guarantee a normal baby, as 3-5% of newborns have some type of birth defect, many of which are not prenatally diagnosable.  Pamela Doyle desired cystic fibrosis carrier screening for her partner, Pamela Doyle; however, he does not have health insurance. Mr.Brownqualifies for free testing through [employer]'s Patient Assistance Program. IprovidedMs. Blackwellwitha saliva kit and a partially completed Patient Assistance Program application form for Mr.Giammona. We made a plan for her to have him sign the form, thenemail me aphoto of the signed form and a copy of their tax returnsso that I can provide the required documentation for free testingto the laboratory. Testingresults will take 2-3 weeks to become available from the time thelaboratoryreceives Mr.Weiland's sample. I will call the couple when results become available.  I counseled Pamela Doyle regarding the above risks and available options. The approximate face-to-face time with the genetic counselor was 40 minutes.  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 It is also possible that they may have no features of  CF at all  Desires partner carrier screening. Gave patient saliva kit. She will email me photos of signed Patient Assistance Application and tax forms. We will follow results  Reviewed low-risk NIPS results  Reduction in risk for Down syndrome,trisomy  18,trisomy 7, sex chromosome aneuploidies, and 22q11.2deletionsyndrome  Offered additional testing and screening  Declined amniocentesis  Recommend MS-AFP screening  Reviewed family history concerns   Buelah Manis, MS, Counselling psychologist

## 2020-03-03 ENCOUNTER — Telehealth (HOSPITAL_COMMUNITY): Payer: Self-pay | Admitting: Genetic Counselor

## 2020-03-03 NOTE — Telephone Encounter (Signed)
I attempted to call Pamela Doyle to follow-up regarding her partner [name]'s cystic fibrosis carrier screening. [name] submitted a saliva sample as Pamela Doyle and I had discussed during her Genetic Counseling appointment on 02/23/20. However, the laboratory [facility]) informed me that [name] sample tube was not labeled with any unique identifiers such as his name and date of birth, so the sample could not be processed. I left a voicemail for Pamela Doyle indicating that there was a problem with her partner's sample that did not allow for analysis to be completed and requested a call back to discuss the option of collecting a new sample to complete testing.   Gershon Crane, MS, [facility] Genetic Counselor

## 2020-03-11 NOTE — Progress Notes (Signed)
Opened in error

## 2020-03-12 ENCOUNTER — Encounter: Payer: Self-pay | Admitting: Advanced Practice Midwife

## 2020-03-12 DIAGNOSIS — Z141 Cystic fibrosis carrier: Secondary | ICD-10-CM

## 2020-03-12 HISTORY — DX: Cystic fibrosis carrier: Z14.1

## 2020-03-14 ENCOUNTER — Ambulatory Visit (HOSPITAL_COMMUNITY)
Admission: RE | Admit: 2020-03-14 | Discharge: 2020-03-14 | Disposition: A | Discharge status: Home / Self Care | Payer: Medicaid Other | Source: Ambulatory Visit | Attending: Obstetrics and Gynecology | Admitting: Obstetrics and Gynecology

## 2020-03-14 ENCOUNTER — Encounter (HOSPITAL_COMMUNITY): Payer: Self-pay

## 2020-03-14 ENCOUNTER — Other Ambulatory Visit: Payer: Self-pay

## 2020-03-14 ENCOUNTER — Telehealth (INDEPENDENT_AMBULATORY_CARE_PROVIDER_SITE_OTHER): Payer: Medicaid Other | Admitting: Family Medicine

## 2020-03-14 ENCOUNTER — Ambulatory Visit (HOSPITAL_COMMUNITY): Payer: Medicaid Other | Admitting: *Deleted

## 2020-03-14 VITALS — BP 116/84

## 2020-03-14 VITALS — BP 116/68 | HR 68 | Temp 97.5°F

## 2020-03-14 DIAGNOSIS — Z3A23 23 weeks gestation of pregnancy: Secondary | ICD-10-CM | POA: Diagnosis not present

## 2020-03-14 DIAGNOSIS — O09892 Supervision of other high risk pregnancies, second trimester: Secondary | ICD-10-CM | POA: Diagnosis not present

## 2020-03-14 DIAGNOSIS — Z362 Encounter for other antenatal screening follow-up: Secondary | ICD-10-CM | POA: Diagnosis not present

## 2020-03-14 DIAGNOSIS — Z363 Encounter for antenatal screening for malformations: Secondary | ICD-10-CM

## 2020-03-14 DIAGNOSIS — O99830 Other infection carrier state complicating pregnancy: Secondary | ICD-10-CM

## 2020-03-14 DIAGNOSIS — Z141 Cystic fibrosis carrier: Secondary | ICD-10-CM | POA: Diagnosis not present

## 2020-03-14 DIAGNOSIS — Z3400 Encounter for supervision of normal first pregnancy, unspecified trimester: Secondary | ICD-10-CM

## 2020-03-14 DIAGNOSIS — O0990 Supervision of high risk pregnancy, unspecified, unspecified trimester: Secondary | ICD-10-CM | POA: Diagnosis present

## 2020-03-14 NOTE — Patient Instructions (Signed)

## 2020-03-14 NOTE — Progress Notes (Signed)
116/84  

## 2020-03-15 ENCOUNTER — Encounter: Payer: Self-pay | Admitting: Family Medicine

## 2020-03-15 NOTE — Progress Notes (Signed)
OBSTETRICS PRENATAL VIRTUAL VISIT ENCOUNTER NOTE  Provider location: Center for [facility] at [facility]   I connected with Pamela Doyle on 03/15/20 at  2:00 PM EDT by MyChart Video Encounter at home and verified that I am speaking with the correct person using two identifiers.   I discussed the limitations, risks, security and privacy concerns of performing an evaluation and management service virtually and the availability of in person appointments. I also discussed with the patient that there may be a patient responsible charge related to this service. The patient expressed understanding and agreed to proceed. Subjective:  Pamela Doyle is a 24 y.o. G1P0 at [redacted]w[redacted]d being seen today for ongoing prenatal care.  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  Patient reports no complaints.   .  .   . Denies any leaking of fluid.   The following portions of the patient's history were reviewed and updated as appropriate: allergies, current medications, past family history, past medical history, past social history, past surgical history and problem list.   Objective:   Vitals:   03/14/20 1357  BP: 116/84    Fetal Status:           General:  Alert, oriented and cooperative. Patient is in no acute distress.  Respiratory: Normal respiratory effort, no problems with respiration noted  Mental Status: Normal mood and affect. Normal behavior. Normal judgment and thought content.  Rest of physical exam deferred due to type of encounter  Imaging: [place] MFM OB DETAIL +14 WK  Result Date: 02/15/2020 ----------------------------------------------------------------------  OBSTETRICS REPORT                       (Signed Final 02/15/2020 01:00 pm)  ---------------------------------------------------------------------- Patient Info  ID #:       [id number]                          D.O.B.:  07-02-1996 (24 yrs)  Name:       Pamela Doyle                 Visit Date: 02/15/2020 09:37 am ---------------------------------------------------------------------- Performed By  Performed By:     Tommie Raymond BS,       Ref. Address:     [address]                    [address]                                                             [address]  Attending:        Noralee Space MD        [facility]  Referred By:      [facility] ---------------------------------------------------------------------- Orders   #  Description                          Code         Ordered By   1  [place] MFM OB DETAIL +14 [place]              65465.03     Mallie Snooks  ----------------------------------------------------------------------   #  Order #                    Accession #                 Episode #   1  [id number]                  [id number]                  [id number]  ---------------------------------------------------------------------- Indications   [redacted] weeks gestation of pregnancy                Z3A.19   Encounter for antenatal screening for          Z36.3   malformations   Cystic Fibrosis (CF) Carrier, second trimester O09.892  ---------------------------------------------------------------------- Fetal Evaluation  Num Of Fetuses:         1  Fetal Heart Rate(bpm):  151  Cardiac Activity:       Observed  Presentation:           Cephalic  Placenta:               Anterior  P. Cord Insertion:      Visualized, central  Amniotic Fluid  AFI FV:      Within normal limits                              Largest Pocket(cm)                              7.3 ---------------------------------------------------------------------- Biometry  BPD:      44.8   mm     G. Age:  19w 4d         74  %    CI:        78.27   %    70 - 86                                                          FL/HC:      17.4   %    16.1 - 18.3  HC:      160.2  mm     G. Age:  18w 6d         35  %    HC/AC:      1.06  1.09 - 1.39  AC:      150.9  mm     G. Age:  20w 2d         85  %    FL/BPD:     62.1   %  FL:       27.8  mm     G. Age:  18w 4d         26  %    FL/AC:      18.4   %    20 - 24  HUM:      28.3  mm     G. Age:  19w 1d         53  %  CER:      19.5  mm     G. Age:  18w 5d         43  %  NFT:       3.7  mm  LV:        7.8  mm  CM:        5.8  mm  Est. FW:     292  gm    0 lb 10 oz      71  % ---------------------------------------------------------------------- OB History  Blood Type:    B+  Gravidity:    1         Term:   0        Prem:   0        SAB:   0  TOP:          0       Ectopic:  0        Living: 0 ---------------------------------------------------------------------- Gestational Age  LMP:           19w 0d        Date:  10/05/19                 EDD:   07/11/20  U/S Today:     19w 2d                                        EDD:   07/09/20  Best:          19w 0d     Det. By:  LMP  (10/05/19)          EDD:   07/11/20 ---------------------------------------------------------------------- Anatomy  Cranium:               Appears normal         LVOT:                   Appears normal  Cavum:                 Appears normal         Aortic Arch:            Not well visualized  Ventricles:            Appears normal         Ductal Arch:            Not well visualized  Choroid Plexus:        Appears normal         Diaphragm:              Appears normal  Cerebellum:  Appears normal         Stomach:                Appears normal, left                                                                        sided  Posterior Fossa:       Appears normal         Abdomen:                Appears normal  Nuchal Fold:           Appears normal         Abdominal Wall:         Appears  nml (cord                                                                        insert, abd wall)  Face:                  Appears normal         Cord Vessels:           Appears normal (3                         (orbits and profile)                           vessel cord)  Lips:                  Appears normal         Kidneys:                Appear normal  Palate:                Not well visualized    Bladder:                Appears normal  Thoracic:              Appears normal         Spine:                  Not well visualized  Heart:                 Appears normal         Upper Extremities:      Appears normal                         (4CH, axis, and                         situs)  RVOT:                  Not well visualized    Lower Extremities:  Appears normal  Other:  Female gender. Heels and Left 5th digit visualized. Technically difficult          due to fetal position. ---------------------------------------------------------------------- Cervix Uterus Adnexa  Cervix  Length:           3.44  cm.  Normal appearance by transabdominal scan.  Uterus  No abnormality visualized.  Left Ovary  Within normal limits. No adnexal mass visualized.  Right Ovary  Within normal limits. No adnexal mass visualized.  Cul De Sac  No free fluid seen.  Adnexa  No abnormality visualized. ---------------------------------------------------------------------- Impression  Ms. Blackwell, G1, P0, is here for fetal anatomy scan.  On cell  free fetal DNA screening, the risks of fetal aneuploidies are  not increased.  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  Her partner has not  been screened yet.  Patient had spoken with the thyroid  genetic counselor and is aware of the cost involved for  partner screening.  We performed fetal anatomy scan. No makers of  aneuploidies or fetal structural defects are seen. Fetal  biometry is consistent with her previously-established dates.  Amniotic fluid is  normal and good fetal activity is seen.  Patient understands the limitations of ultrasound in detecting  fetal anomalies.  We reassured the patient of ultrasound findings.  I briefly discussed the significance of carrier screening results  and encouraged her to meet with our genetic counselor. ---------------------------------------------------------------------- Recommendations  -Genetic counseling appointment next week.  -An appointment was made for her to return in 4 weeks for  completion of fetal anatomy. ----------------------------------------------------------------------                  Noralee Space, MD Electronically Signed Final Report   02/15/2020 01:00 pm ----------------------------------------------------------------------  [place] MFM OB FOLLOW UP  Result Date: 03/14/2020 ----------------------------------------------------------------------  OBSTETRICS REPORT                       (Signed Final 03/14/2020 10:08 pm) ---------------------------------------------------------------------- Patient Info  ID #:       [id number]                          D.O.B.:  December 16, 1995 (24 yrs)  Name:       Pamela Doyle                 Visit Date: 03/14/2020 10:35 am ---------------------------------------------------------------------- Performed By  Performed By:     Birdena Crandall        Ref. Address:      [address]                    [address]                                                              [address]  Attending:        Lin Landsman      [facility]  Referred By:      [facility] ---------------------------------------------------------------------- Orders   #  Description                          Code         Ordered By   1  [place] MFM OB FOLLOW UP                  [phone]     Noralee Space  ----------------------------------------------------------------------   #  Order #                     Accession #                 Episode #   1  [id number]                  [id number]                  [id number]  ---------------------------------------------------------------------- Indications   Antenatal follow-up for nonvisualized fetal    Z36.2   anatomy   Encounter for antenatal screening for          Z36.3   malformations   Cystic Fibrosis (CF) Carrier, second trimester O09.892   [redacted] weeks gestation of pregnancy                Z3A.23  ---------------------------------------------------------------------- Fetal Evaluation  Num Of Fetuses:          1  Fetal Heart Rate(bpm):   146  Cardiac Activity:        Observed  Presentation:            Variable  Placenta:                Anterior  P. Cord Insertion:       Visualized, central  Amniotic Fluid  AFI FV:      Within normal limits                              Largest Pocket(cm)                              7.7 ---------------------------------------------------------------------- Biometry  BPD:      58.8  mm     G. Age:  24w 0d         82  %    CI:        73.51   %    70 - 86                                                          FL/HC:       18.2  %    19.2 - 20.8  HC:      217.9  mm     G. Age:  23w 6d         69  %    HC/AC:       1.10       1.05 - 1.21  AC:      198.8  mm     G. Age:  24w 4d  86  %    FL/BPD:      67.5  %    71 - 87  FL:       39.7  mm     G. Age:  22w 6d         32  %    FL/AC:       20.0  %    20 - 24  HUM:      38.3  mm     G. Age:  23w 4d         53  %  Est. FW:     630   gm     1 lb 6 oz     81  % ---------------------------------------------------------------------- OB History  Blood Type:    B+  Gravidity:    1         Term:   0        Prem:   0        SAB:   0  TOP:          0       Ectopic:  0        Living: 0 ---------------------------------------------------------------------- Gestational Age  LMP:           23w 0d        Date:  10/05/19                 EDD:   07/11/20  U/S Today:     23w 6d                                         EDD:   07/05/20  Best:          23w 0d     Det. By:  LMP  (10/05/19)          EDD:   07/11/20 ---------------------------------------------------------------------- Anatomy  Cranium:               Appears normal         LVOT:                   Previously seen  Cavum:                 Appears normal         Aortic Arch:            Appears normal  Ventricles:            Appears normal         Ductal Arch:            Appears normal  Choroid Plexus:        Appears normal         Diaphragm:              Appears normal  Cerebellum:            Appears normal         Stomach:                Appears normal, left  sided  Posterior Fossa:       Appears normal         Abdomen:                Appears normal  Nuchal Fold:           Previously seen        Abdominal Wall:         Appears nml (cord                                                                        insert, abd wall)  Face:                  Orbits and profile     Cord Vessels:           Appears normal (3                         previously seen                                vessel cord)  Lips:                  Previously seen        Kidneys:                Appear normal  Palate:                Not well visualized    Bladder:                Appears normal  Thoracic:              Appears normal         Spine:                  Appears normal  Heart:                 Appears normal         Upper Extremities:      Previously seen                         (4CH, axis, and                         situs)  RVOT:                  Appears normal         Lower Extremities:      Previously seen  Other:  Female gender. Heels and Left 5th digit previously visualized.          Technically difficult due to fetal position. ---------------------------------------------------------------------- Cervix Uterus Adnexa  Cervix  Normal appearance by transabdominal scan.  Uterus  No abnormality visualized.  Cul De Sac  No  free fluid seen.  Adnexa  No abnormality visualized. ---------------------------------------------------------------------- Impression  Follow up anatomy performed  Normal interval growth, anatomy completed today  Good fetal movement and amniotic fluid ---------------------------------------------------------------------- Recommendations  Follow up as clinically indicated. ----------------------------------------------------------------------  Lin Landsman, MD Electronically Signed Final Report   03/14/2020 10:08 pm ----------------------------------------------------------------------   Assessment and Plan:  Pregnancy: G1P0 at [redacted]w[redacted]d 1. Cystic fibrosis carrier Has sent off her partner's test this week  2. Supervision of normal first pregnancy, antepartum Continue routine prenatal care.   Preterm labor symptoms and general obstetric precautions including but not limited to vaginal bleeding, contractions, leaking of fluid and fetal movement were reviewed in detail with the patient. I discussed the assessment and treatment plan with the patient. The patient was provided an opportunity to ask questions and all were answered. The patient agreed with the plan and demonstrated an understanding of the instructions. The patient was advised to call back or seek an in-person office evaluation/go to MAU at [facility] for any urgent or concerning symptoms. Please refer to After Visit Summary for other counseling recommendations.   I provided 11 minutes of face-to-face time during this encounter.  Return in 4 weeks (on 04/11/2020) for in person, 28 wk labs--needs to be fasting.  Future Appointments  Date Time Provider Department Center  04/12/2020  8:10 AM Calvert Cantor, CNM [facility]    Reva Bores, MD Center for [facility], [facility]

## 2020-04-08 ENCOUNTER — Ambulatory Visit (INDEPENDENT_AMBULATORY_CARE_PROVIDER_SITE_OTHER)
Admission: RE | Admit: 2020-04-08 | Discharge: 2020-04-08 | Disposition: A | Discharge status: Home / Self Care | Payer: Medicaid Other | Source: Ambulatory Visit

## 2020-04-08 DIAGNOSIS — R109 Unspecified abdominal pain: Secondary | ICD-10-CM

## 2020-04-08 DIAGNOSIS — Z3A26 26 weeks gestation of pregnancy: Secondary | ICD-10-CM

## 2020-04-08 DIAGNOSIS — W19XXXA Unspecified fall, initial encounter: Secondary | ICD-10-CM

## 2020-04-08 NOTE — Discharge Instructions (Addendum)
Recommend you go to [facility] hospital for further evaluation.

## 2020-04-08 NOTE — ED Provider Notes (Signed)
Virtual Visit via Video Note:  Pamela Doyle  initiated request for Telemedicine visit with [facility] Urgent Care team. I connected with Pamela Doyle  on 04/08/2020 at 2:39 PM  for a synchronized telemedicine visit using a video enabled HIPPA compliant telemedicine application. I verified that I am speaking with Pamela Doyle  using two identifiers. [name], PA-C  was physically located in a [facility] Urgent care site and Pamela Doyle was located at a different location.   The limitations of evaluation and management by telemedicine as well as the availability of in-person appointments were discussed. Patient was informed that she  may incur a bill ( including co-pay) for this virtual visit encounter. Pamela Doyle  expressed understanding and gave verbal consent to proceed with virtual visit.     History of Present Illness:Pamela Doyle  is a 24 y.o. female [redacted] weeks gestation presents with right-sided abdominal tightness/pain.  States this occurred after fall 1.5 hours ago.  States she fell down 4-5 stone steps.  Denies head trauma, LOC.  Does endorse left knee abrasion, right wrist pain.  No swelling, deformity.  Patient states she feels baby moving: Unsure if as active as baseline.  Denies nausea, vomiting, headache, chest pain, difficulty breathing.  No pelvic pain, vaginal discharge or bleeding.  Unsure if she should seek further evaluation: Has not taken anything for pain yet.    ROI as per HPI  Past Medical History:  Diagnosis Date  . Chronic kidney disease    mal rotated kidneys    No Known Allergies      Observations/Objective: 24 y.o. female Sitting in no acute distress.  Patient is able to speak in full sentences without coughing, sneezing, wheezing.  Assessment and Plan: Discussed limited utility of virtual visit, urgent care visit: Recommended patient go to [facility] for further evaluation +/-fetal monitoring.  Follow Up  Instructions: Patient to go to [facility] for further evaluation.   I discussed the assessment and treatment plan with the patient. The patient was provided an opportunity to ask questions and all were answered. The patient agreed with the plan and demonstrated an understanding of the instructions.   The patient was advised to call back or seek an in-person evaluation if the symptoms worsen or if the condition fails to improve as anticipated.  I provided 10 minutes of non-face-to-face time during this encounter.    [name], PA-C  04/08/2020 2:39 PM        Hall-Potvin, [place], [place] 04/08/20 1439

## 2020-04-12 ENCOUNTER — Other Ambulatory Visit: Payer: Self-pay

## 2020-04-12 ENCOUNTER — Ambulatory Visit (INDEPENDENT_AMBULATORY_CARE_PROVIDER_SITE_OTHER): Payer: Medicaid Other | Admitting: Advanced Practice Midwife

## 2020-04-12 VITALS — BP 108/76 | HR 84 | Wt 212.2 lb

## 2020-04-12 DIAGNOSIS — O99210 Obesity complicating pregnancy, unspecified trimester: Secondary | ICD-10-CM | POA: Insufficient documentation

## 2020-04-12 DIAGNOSIS — E669 Obesity, unspecified: Secondary | ICD-10-CM | POA: Diagnosis not present

## 2020-04-12 DIAGNOSIS — O99212 Obesity complicating pregnancy, second trimester: Secondary | ICD-10-CM

## 2020-04-12 DIAGNOSIS — Z23 Encounter for immunization: Secondary | ICD-10-CM | POA: Diagnosis not present

## 2020-04-12 DIAGNOSIS — Z3400 Encounter for supervision of normal first pregnancy, unspecified trimester: Secondary | ICD-10-CM

## 2020-04-12 DIAGNOSIS — Z3A27 27 weeks gestation of pregnancy: Secondary | ICD-10-CM

## 2020-04-12 LAB — CBC
Hematocrit: 34.7 % (ref 34.0–46.6)
Hemoglobin: 11.8 g/dL (ref 11.1–15.9)
MCH: 30.9 pg (ref 26.6–33.0)
MCHC: 34 g/dL (ref 31.5–35.7)
MCV: 91 fL (ref 79–97)
Platelets: 135 10*3/uL — ABNORMAL LOW (ref 150–450)
RBC: 3.82 x10E6/uL (ref 3.77–5.28)
RDW: 12.4 % (ref 11.7–15.4)
WBC: 9.4 10*3/uL (ref 3.4–10.8)

## 2020-04-12 NOTE — Progress Notes (Signed)
   PRENATAL VISIT NOTE  Subjective:  Pamela Doyle is a 24 y.o. G1P0 at [redacted]w[redacted]d being seen today for ongoing prenatal care.  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  Patient reports no complaints. She fell 05/22, was not evaluated in MAU. Denies abdominal trauma or concerns.  Patient originally planned to transition out of [facility] and into separate midwifery care for a waterbirth at home. She has since changed her mind and now would like to continue with [facility] and have a waterbirth at [facility].  Contractions: Not present. Vag. Bleeding: None.  Movement: Present. Denies leaking of fluid.   Patient states her MyChart app consistently malfunctions and she is unable to send the blood pressures she obtains at home.   The following portions of the patient's history were reviewed and updated as appropriate: allergies, current medications, past family history, past medical history, past social history, past surgical history and problem list. Problem list updated.  Objective:   Vitals:   04/12/20 0824  BP: 108/76  Pulse: 84  Weight: 212 lb 3.2 oz (96.3 kg)    Fetal Status: Fetal Heart Rate (bpm): 153 Fundal Height: 29 cm Movement: Present     General:  Alert, oriented and cooperative. Patient is in no acute distress.  Skin: Skin is warm and dry. No rash noted.   Cardiovascular: Normal heart rate noted  Respiratory: Normal respiratory effort, no problems with respiration noted  Abdomen: Soft, gravid, appropriate for gestational age.  Pain/Pressure: Absent     Pelvic: Cervical exam performed        Extremities: Normal range of motion.  Edema: None  Mental Status: Normal mood and affect. Normal behavior. Normal judgment and thought content.   Assessment and Plan:    Pregnancy: G1P0 at [redacted]w[redacted]d  1. Supervision of normal first pregnancy, antepartum - Please report to MAU for evaluation in event of fall, even if no abdominal trauma sustained - Reviewed availability, requirements and contraindications for waterbirth at [facility] - Encouraged patient to consider doula - Referred to [facility] Healthy Baby website - Glucose Tolerance, 2 Hours w/1 Hour - RPR - CBC - HIV Antibody (routine testing w rflx)  2. Obesity in pregnancy - Pregravid BMI 29.7 - TWG 17 lbs  Preterm labor symptoms and general obstetric precautions including but not limited to vaginal bleeding, contractions, leaking of fluid and fetal movement were reviewed in detail with the patient. Please refer to After Visit Summary for other counseling recommendations.  Return in about 2 weeks (around 04/26/2020) for Virtual.  Future Appointments  Date Time Provider Department Center  04/25/2020  1:30 PM Reva Bores, MD [facility]    Calvert Cantor, CNM

## 2020-04-12 NOTE — Patient Instructions (Signed)

## 2020-04-13 ENCOUNTER — Ambulatory Visit (INDEPENDENT_AMBULATORY_CARE_PROVIDER_SITE_OTHER): Payer: Medicaid Other

## 2020-04-13 ENCOUNTER — Other Ambulatory Visit: Payer: Self-pay

## 2020-04-13 ENCOUNTER — Other Ambulatory Visit (HOSPITAL_COMMUNITY)
Admission: RE | Admit: 2020-04-13 | Discharge: 2020-04-13 | Disposition: A | Discharge status: Home / Self Care | Payer: Medicaid Other | Source: Ambulatory Visit | Attending: Obstetrics and Gynecology | Admitting: Obstetrics and Gynecology

## 2020-04-13 VITALS — BP 121/77 | HR 82

## 2020-04-13 DIAGNOSIS — O26892 Other specified pregnancy related conditions, second trimester: Secondary | ICD-10-CM | POA: Insufficient documentation

## 2020-04-13 DIAGNOSIS — Z3A27 27 weeks gestation of pregnancy: Secondary | ICD-10-CM

## 2020-04-13 DIAGNOSIS — N898 Other specified noninflammatory disorders of vagina: Secondary | ICD-10-CM | POA: Diagnosis present

## 2020-04-13 LAB — GLUCOSE TOLERANCE, 2 HOURS W/ 1HR
Glucose, 1 hour: 109 mg/dL (ref 65–179)
Glucose, 2 hour: 73 mg/dL (ref 65–152)
Glucose, Fasting: 88 mg/dL (ref 65–91)

## 2020-04-13 LAB — HIV ANTIBODY (ROUTINE TESTING W REFLEX): HIV Screen 4th Generation wRfx: NONREACTIVE

## 2020-04-13 LAB — RPR: RPR Ser Ql: NONREACTIVE

## 2020-04-13 NOTE — Progress Notes (Signed)
SUBJECTIVE:  24 y.o. female complains of vag discharge for a couple of days. Denies abnormal vaginal bleeding or significant pelvic pain or fever. No UTI symptoms. Denies history of known exposure to STD.  Patient's last menstrual period was 10/05/2019 (exact date).  OBJECTIVE:  She appears well, afebrile. Urine dipstick: not done   ASSESSMENT:  Vaginal Discharge: small amount  Vaginal Odor: none at this time   PLAN:  GC, chlamydia, trichomonas, BVAG, CVAG probe sent to lab. Treatment: To be determined once lab results are received ROV prn if symptoms persist or worsen.

## 2020-04-15 ENCOUNTER — Encounter: Payer: Self-pay | Admitting: Advanced Practice Midwife

## 2020-04-15 DIAGNOSIS — D696 Thrombocytopenia, unspecified: Secondary | ICD-10-CM | POA: Insufficient documentation

## 2020-04-19 LAB — CERVICOVAGINAL ANCILLARY ONLY
Bacterial Vaginitis (gardnerella): POSITIVE — AB
Candida Glabrata: NEGATIVE
Candida Vaginitis: POSITIVE — AB
Chlamydia: NEGATIVE
Comment: NEGATIVE
Comment: NEGATIVE
Comment: NEGATIVE
Comment: NEGATIVE
Comment: NEGATIVE
Comment: NORMAL
Neisseria Gonorrhea: NEGATIVE
Trichomonas: NEGATIVE

## 2020-04-20 ENCOUNTER — Other Ambulatory Visit: Payer: Self-pay | Admitting: Obstetrics and Gynecology

## 2020-04-20 MED ORDER — METRONIDAZOLE 500 MG PO TABS: 500.0000 mg | ORAL_TABLET | Freq: Two times a day (BID) | ORAL | 0 refills | Status: DC

## 2020-04-20 MED ORDER — TERCONAZOLE 0.8 % VA CREA
1.0000 | TOPICAL_CREAM | Freq: Every day | VAGINAL | 0 refills | Status: DC
Start: 2020-04-20 — End: 2020-04-21

## 2020-04-21 ENCOUNTER — Other Ambulatory Visit: Payer: Self-pay

## 2020-04-21 ENCOUNTER — Telehealth: Payer: Self-pay

## 2020-04-21 MED ORDER — METRONIDAZOLE 500 MG PO TABS
500.0000 mg | ORAL_TABLET | Freq: Two times a day (BID) | ORAL | 0 refills | Status: DC
Start: 2020-04-21 — End: 2020-05-11

## 2020-04-21 MED ORDER — TERCONAZOLE 0.8 % VA CREA
1.0000 | TOPICAL_CREAM | Freq: Every day | VAGINAL | 0 refills | Status: DC
Start: 2020-04-21 — End: 2020-05-22

## 2020-04-21 NOTE — Telephone Encounter (Signed)
-----   Message from Heather B Walker, NT sent at 04/21/2020  8:04 AM EDT ----- °Regarding: change pharmacy °Patient has 2 rx that was [facility] [place] and needs rx sent [facility] [place] where she lives.  ° ° °Thanks ° °

## 2020-04-21 NOTE — Telephone Encounter (Signed)
-----   Message from Rozann Lesches, NT sent at 04/21/2020  8:04 AM EDT ----- Regarding: change pharmacy Patient has 2 rx that was [facility] [place] and needs rx sent [facility] where she lives.    Thanks

## 2020-04-21 NOTE — Telephone Encounter (Signed)
[facility] [place]

## 2020-04-22 ENCOUNTER — Ambulatory Visit: Payer: Self-pay

## 2020-04-24 ENCOUNTER — Telehealth: Payer: Self-pay | Admitting: Genetic Counselor

## 2020-04-24 NOTE — Telephone Encounter (Signed)
I called Pamela Doyle to discuss her partner's negative carrier screening results for cystic fibrosis (CF). Pamela Doyle partner, Pamela Doyle, had submitted another sample to the laboratory on 5/14. These test results were negative, or normal. Based on this negative result and his ethnic background, Pamela Doyle has a 1 in 2700 residual risk of being a carrier for CF. Thus, the couple's chances of having a child with CF has significantly decreased to 1 in 10,800. I asked Pamela Doyle if she would like me to call her partner to discuss these results directly with him, but she indicated that he was right next to her and was listening in on the phone call.  Pamela Doyle informed me that she is still looking for their tax forms from last year as they recently moved and the forms were packed away. She will email me a copy of these as soon as she finds them to ensure that Pamela Doyle testing will be free through [employer]'s Patient Assistance Program. The couple confirmed that they had no further questions at this time.   Pamela Crane, MS, [facility] Genetic Counselor

## 2020-04-25 ENCOUNTER — Ambulatory Visit (INDEPENDENT_AMBULATORY_CARE_PROVIDER_SITE_OTHER): Payer: Medicaid Other | Admitting: Family Medicine

## 2020-04-25 ENCOUNTER — Other Ambulatory Visit: Payer: Self-pay

## 2020-04-25 DIAGNOSIS — Z3400 Encounter for supervision of normal first pregnancy, unspecified trimester: Secondary | ICD-10-CM

## 2020-04-25 DIAGNOSIS — Z3403 Encounter for supervision of normal first pregnancy, third trimester: Secondary | ICD-10-CM

## 2020-04-25 DIAGNOSIS — Z3A29 29 weeks gestation of pregnancy: Secondary | ICD-10-CM

## 2020-04-25 NOTE — Patient Instructions (Signed)

## 2020-04-26 NOTE — Progress Notes (Signed)
   PRENATAL VISIT NOTE  Subjective:  Pamela Doyle is a 24 y.o. G1P0 at [redacted]w[redacted]d being seen today for ongoing prenatal care.  She is currently monitored for the following issues for this low-risk pregnancy and has Supervision of normal first pregnancy, antepartum; Biological false positive RPR test; Abnormal genetic test during pregnancy; Cystic fibrosis carrier; Obesity in pregnancy; and Gestational thrombocytopenia, second trimester (HCC) on their problem list.  Patient reports no complaints.  Contractions: Not present. Vag. Bleeding: None.  Movement: Present. Denies leaking of fluid.   The following portions of the patient's history were reviewed and updated as appropriate: allergies, current medications, past family history, past medical history, past social history, past surgical history and problem list.   Objective:   Vitals:   04/25/20 1333  BP: 120/74  Pulse: 80  Weight: 219 lb (99.3 kg)    Fetal Status: Fetal Heart Rate (bpm): 153 Fundal Height: 29 cm Movement: Present     General:  Alert, oriented and cooperative. Patient is in no acute distress.  Skin: Skin is warm and dry. No rash noted.   Cardiovascular: Normal heart rate noted  Respiratory: Normal respiratory effort, no problems with respiration noted  Abdomen: Soft, gravid, appropriate for gestational age.  Pain/Pressure: Present     Pelvic: Cervical exam deferred        Extremities: Normal range of motion.  Edema: Trace  Mental Status: Normal mood and affect. Normal behavior. Normal judgment and thought content.   Assessment and Plan:  Pregnancy: G1P0 at [redacted]w[redacted]d 1. Supervision of normal first pregnancy, antepartum Continue routine prenatal care.   Preterm labor symptoms and general obstetric precautions including but not limited to vaginal bleeding, contractions, leaking of fluid and fetal movement were reviewed in detail with the patient. Please refer to After Visit Summary for other counseling recommendations.    Return in 2 weeks (on 05/09/2020).  Future Appointments  Date Time Provider Department Center  05/11/2020  9:45 AM Victoria Vera Bing, MD [facility]    Reva Bores, MD

## 2020-05-11 ENCOUNTER — Telehealth (INDEPENDENT_AMBULATORY_CARE_PROVIDER_SITE_OTHER): Payer: Medicaid Other | Admitting: Obstetrics and Gynecology

## 2020-05-11 ENCOUNTER — Other Ambulatory Visit: Payer: Self-pay

## 2020-05-11 VITALS — BP 123/77 | HR 72 | Wt 219.0 lb

## 2020-05-11 DIAGNOSIS — E669 Obesity, unspecified: Secondary | ICD-10-CM

## 2020-05-11 DIAGNOSIS — O99213 Obesity complicating pregnancy, third trimester: Secondary | ICD-10-CM

## 2020-05-11 DIAGNOSIS — O99112 Other diseases of the blood and blood-forming organs and certain disorders involving the immune mechanism complicating pregnancy, second trimester: Secondary | ICD-10-CM

## 2020-05-11 DIAGNOSIS — Z3A31 31 weeks gestation of pregnancy: Secondary | ICD-10-CM

## 2020-05-11 DIAGNOSIS — O99113 Other diseases of the blood and blood-forming organs and certain disorders involving the immune mechanism complicating pregnancy, third trimester: Secondary | ICD-10-CM

## 2020-05-11 DIAGNOSIS — Z3400 Encounter for supervision of normal first pregnancy, unspecified trimester: Secondary | ICD-10-CM

## 2020-05-11 DIAGNOSIS — D696 Thrombocytopenia, unspecified: Secondary | ICD-10-CM

## 2020-05-11 DIAGNOSIS — R109 Unspecified abdominal pain: Secondary | ICD-10-CM

## 2020-05-11 DIAGNOSIS — O26893 Other specified pregnancy related conditions, third trimester: Secondary | ICD-10-CM

## 2020-05-11 LAB — POCT URINALYSIS DIPSTICK
Blood, UA: NEGATIVE
Glucose, UA: NEGATIVE
Leukocytes, UA: NEGATIVE
Spec Grav, UA: 1.015 (ref 1.010–1.025)
Urobilinogen, UA: 0.2 E.U./dL
pH, UA: 5 (ref 5.0–8.0)

## 2020-05-11 LAB — CBC
Hematocrit: 34.8 % (ref 34.0–46.6)
Hemoglobin: 11.9 g/dL (ref 11.1–15.9)
MCH: 30.3 pg (ref 26.6–33.0)
MCHC: 34.2 g/dL (ref 31.5–35.7)
MCV: 89 fL (ref 79–97)
Platelets: 132 x10E3/uL — ABNORMAL LOW (ref 150–450)
RBC: 3.93 x10E6/uL (ref 3.77–5.28)
RDW: 12.2 % (ref 11.7–15.4)
WBC: 12.1 x10E3/uL — ABNORMAL HIGH (ref 3.4–10.8)

## 2020-05-11 NOTE — Progress Notes (Signed)
° °  TELEHEALTH VIRTUAL OBSTETRICS VISIT ENCOUNTER NOTE  Clinic: [facility]  I connected with Pamela Doyle on 05/11/20 at  9:45 AM EDT by telephone at home and verified that I am speaking with the correct person using two identifiers.   I discussed the limitations, risks, security and privacy concerns of performing an evaluation and management service by telephone and the availability of in person appointments. I also discussed with the patient that there may be a patient responsible charge related to this service. The patient expressed understanding and agreed to proceed.  Subjective:  Pamela Doyle is a 24 y.o. G1P0 at [redacted]w[redacted]d being followed for ongoing prenatal care.  She is currently monitored for the following issues for this high-risk pregnancy and has Supervision of normal first pregnancy, antepartum; Biological false positive RPR test; Abnormal genetic test during pregnancy; Cystic fibrosis carrier; Obesity in pregnancy; and Gestational thrombocytopenia, second trimester (HCC) on their problem list.  Patient reports see below. Reports fetal movement. Denies any contractions, bleeding or leaking of fluid.   The following portions of the patient's history were reviewed and updated as appropriate: allergies, current medications, past family history, past medical history, past social history, past surgical history and problem list.   Objective:  There were no vitals filed for this visit.  Babyscripts Data Reviewed: not applicable  General:  Alert, oriented and cooperative.   Mental Status: Normal mood and affect perceived. Normal judgment and thought content.  Rest of physical exam deferred due to type of encounter  Assessment and Plan:  Pregnancy: G1P0 at [redacted]w[redacted]d 1. Gestational thrombocytopenia, second trimester [facility]) Will have pt come in for visit today for exam  2. Supervision of normal first pregnancy, antepartum  3. Abdominal discomfort  Preterm labor  symptoms and general obstetric precautions including but not limited to vaginal bleeding, contractions, leaking of fluid and fetal movement were reviewed in detail with the patient.  I discussed the assessment and treatment plan with the patient. The patient was provided an opportunity to ask questions and all were answered. The patient agreed with the plan and demonstrated an understanding of the instructions. The patient was advised to call back or seek an in-person office evaluation/go to [facility] at [facility] for any urgent or concerning symptoms. Please refer to After Visit Summary for other counseling recommendations.   I provided 10 minutes of non-face-to-face time during this encounter. The visit was conducted via MyChart-medicine  Return in about 1 week (around 05/18/2020) for 7-10d rob, in person.  Future Appointments  Date Time Provider Department Center  05/25/2020  3:30 PM Daggett Bing, MD [facility]    Braman Bing, MD [facility]   ADDENDUM NOTE FHR and FH normal EGBUS, vagina and cervix all normal. cx check cl/long/high  Routine care. CBC today Pamela Copa MD Attending [facility] [employer] (Faculty Practice) 05/11/2020 Time: [phone]

## 2020-05-11 NOTE — Progress Notes (Signed)
Increased pelvic pressure and discharge   I connected with  [name] on 05/11/20 at  9:45 AM EDT by telephone and verified that I am speaking with the correct person using two identifiers.   I discussed the limitations, risks, security and privacy concerns of performing an evaluation and management service by telephone and the availability of in person appointments. I also discussed with the patient that there may be a patient responsible charge related to this service. The patient expressed understanding and agreed to proceed.  Scheryl Marten, RN 05/11/2020  10:01 AM

## 2020-05-22 ENCOUNTER — Telehealth: Payer: Medicaid Other

## 2020-05-22 ENCOUNTER — Other Ambulatory Visit: Payer: Self-pay

## 2020-05-22 ENCOUNTER — Inpatient Hospital Stay (HOSPITAL_COMMUNITY)
Admission: AD | Admit: 2020-05-22 | Discharge: 2020-05-22 | Disposition: A | Discharge status: Home / Self Care | Payer: Medicaid Other | Attending: Obstetrics & Gynecology | Admitting: Obstetrics & Gynecology

## 2020-05-22 ENCOUNTER — Emergency Department (INDEPENDENT_AMBULATORY_CARE_PROVIDER_SITE_OTHER)
Admission: EM | Admit: 2020-05-22 | Discharge: 2020-05-22 | Disposition: A | Discharge status: Home / Self Care | Payer: Medicaid Other | Source: Home / Self Care

## 2020-05-22 ENCOUNTER — Encounter (HOSPITAL_COMMUNITY): Payer: Self-pay | Admitting: Obstetrics & Gynecology

## 2020-05-22 DIAGNOSIS — Z79899 Other long term (current) drug therapy: Secondary | ICD-10-CM | POA: Diagnosis not present

## 2020-05-22 DIAGNOSIS — R030 Elevated blood-pressure reading, without diagnosis of hypertension: Secondary | ICD-10-CM | POA: Diagnosis not present

## 2020-05-22 DIAGNOSIS — O26893 Other specified pregnancy related conditions, third trimester: Secondary | ICD-10-CM | POA: Diagnosis present

## 2020-05-22 DIAGNOSIS — O99113 Other diseases of the blood and blood-forming organs and certain disorders involving the immune mechanism complicating pregnancy, third trimester: Secondary | ICD-10-CM | POA: Diagnosis not present

## 2020-05-22 DIAGNOSIS — O1203 Gestational edema, third trimester: Secondary | ICD-10-CM | POA: Diagnosis not present

## 2020-05-22 DIAGNOSIS — N189 Chronic kidney disease, unspecified: Secondary | ICD-10-CM | POA: Diagnosis not present

## 2020-05-22 DIAGNOSIS — Z3A32 32 weeks gestation of pregnancy: Secondary | ICD-10-CM | POA: Diagnosis not present

## 2020-05-22 DIAGNOSIS — R519 Headache, unspecified: Secondary | ICD-10-CM | POA: Diagnosis not present

## 2020-05-22 DIAGNOSIS — D6959 Other secondary thrombocytopenia: Secondary | ICD-10-CM | POA: Diagnosis not present

## 2020-05-22 DIAGNOSIS — Z3689 Encounter for other specified antenatal screening: Secondary | ICD-10-CM

## 2020-05-22 DIAGNOSIS — M7989 Other specified soft tissue disorders: Secondary | ICD-10-CM | POA: Diagnosis not present

## 2020-05-22 DIAGNOSIS — O26833 Pregnancy related renal disease, third trimester: Secondary | ICD-10-CM | POA: Insufficient documentation

## 2020-05-22 LAB — COMPREHENSIVE METABOLIC PANEL
ALT: 10 U/L (ref 0–44)
AST: 13 U/L — ABNORMAL LOW (ref 15–41)
Albumin: 2.5 g/dL — ABNORMAL LOW (ref 3.5–5.0)
Alkaline Phosphatase: 103 U/L (ref 38–126)
Anion gap: 8 (ref 5–15)
BUN: 6 mg/dL (ref 6–20)
CO2: 20 mmol/L — ABNORMAL LOW (ref 22–32)
Calcium: 8.7 mg/dL — ABNORMAL LOW (ref 8.9–10.3)
Chloride: 108 mmol/L (ref 98–111)
Creatinine, Ser: 0.58 mg/dL (ref 0.44–1.00)
GFR calc Af Amer: >60 mL/min (ref >60)
GFR calc non Af Amer: >60 mL/min (ref >60)
Glucose, Bld: 93 mg/dL (ref 70–99)
Potassium: 3.6 mmol/L (ref 3.5–5.1)
Sodium: 136 mmol/L (ref 135–145)
Total Bilirubin: 0.4 mg/dL (ref 0.3–1.2)
Total Protein: 5.8 g/dL — ABNORMAL LOW (ref 6.5–8.1)

## 2020-05-22 LAB — PROTEIN / CREATININE RATIO, URINE
Creatinine, Urine: 145.31 mg/dL
Protein Creatinine Ratio: 0.09 mg/mg{Cre} (ref 0.00–0.15)
Total Protein, Urine: 13 mg/dL

## 2020-05-22 LAB — URINALYSIS, ROUTINE W REFLEX MICROSCOPIC
Bilirubin Urine: NEGATIVE
Glucose, UA: NEGATIVE mg/dL
Hgb urine dipstick: NEGATIVE
Ketones, ur: NEGATIVE mg/dL
Leukocytes,Ua: NEGATIVE
Nitrite: NEGATIVE
Protein, ur: NEGATIVE mg/dL
Specific Gravity, Urine: 1.018 (ref 1.005–1.030)
pH: 7 (ref 5.0–8.0)

## 2020-05-22 LAB — CBC
HCT: 34.5 % — ABNORMAL LOW (ref 36.0–46.0)
Hemoglobin: 11.4 g/dL — ABNORMAL LOW (ref 12.0–15.0)
MCH: 29.8 pg (ref 26.0–34.0)
MCHC: 33 g/dL (ref 30.0–36.0)
MCV: 90.1 fL (ref 80.0–100.0)
Platelets: 136 10*3/uL — ABNORMAL LOW (ref 150–400)
RBC: 3.83 MIL/uL — ABNORMAL LOW (ref 3.87–5.11)
RDW: 12.9 % (ref 11.5–15.5)
WBC: 11.8 10*3/uL — ABNORMAL HIGH (ref 4.0–10.5)
nRBC: 0 % (ref 0.0–0.2)

## 2020-05-22 NOTE — ED Triage Notes (Signed)
Patient presents to Urgent Care with complaints of bilateral feet swelling since the past few days. Patient reports she was seen by her doctor at the end of last month and was told her platelet count was low, unsure if that is related. Pt is [redacted] weeks pregnant.

## 2020-05-22 NOTE — ED Provider Notes (Signed)
[name]    CSN: [id number] Arrival date & time: 05/22/20  1343      History   Chief Complaint Chief Complaint  Patient presents with  . Foot Swelling    HPI Pamela Doyle is a 25 y.o. female.   HPI  Pamela Doyle is a 24 y.o. female presenting to UC with c/o of bilateral foot swelling. Pt is [redacted] weeks gestation, hx of thrombocytopenia dx in second trimester of pregnancy.  Mild intermittent HA. Feet swelling has improved since yesterday but pt reports it being severe while at work standing on her feet last night.  Denies chest pain or SOB. Denies abdominal pain. Pt able to feel good fetal movement. No vaginal bleeding or abnormal discharge. Pt called the on-call OB, had to leave a voice message but when she was called back, she was advised to go to "any" urgent care.    Past Medical History:  Diagnosis Date  . Chronic kidney disease    mal rotated kidneys    Patient Active Problem List   Diagnosis Date Noted  . Gestational thrombocytopenia, second trimester ([place]) 04/15/2020  . Obesity in pregnancy 04/12/2020  . 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 Abnormal genetic test during pregnancy 01/19/2020  . Supervision of normal first pregnancy, antepartum 12/23/2019  . Biological false positive RPR test 12/23/2019    Past Surgical History:  Procedure Laterality Date  . NO PAST SURGERIES      OB History    Gravida  1   Para      Term      Preterm      AB      Living        SAB      TAB      Ectopic      Multiple      Live Births               Home Medications    Prior to Admission medications   Medication Sig Start Date End Date Taking? Authorizing Provider  Blood Pressure KIT 1 Device by Does not apply route once a week. To be monitored weekly from home 12/22/19  Yes Mallie Snooks C, CNM  docusate sodium (COLACE) 100 MG capsule Take 1 capsule (100 mg total) by mouth 2 (two) times daily as needed. 02/16/20  Yes Weinhold,  Aldona Bar C, CNM  Multiple Vitamins-Minerals (MULTIVITAMIN WITH MINERALS) tablet Take 1 tablet by mouth daily.   Yes [provider]  polyethylene glycol (MIRALAX) 17 g packet Take 17 g by mouth daily. 02/16/20  Yes Weinhold, Aldona Bar C, CNM  Prenatal Vit-Fe Fumarate-FA (MULTIVITAMIN-PRENATAL) 27-0.8 MG TABS tablet Take 1 tablet by mouth daily at 12 noon.   Yes [provider]  witch hazel-glycerin (TUCKS) pad Apply 1 application topically as needed for itching or hemorrhoids. 02/16/20  Yes Mallie Snooks C, CNM  terconazole (TERAZOL 3) 0.8 % vaginal cream Place 1 applicator vaginally at bedtime. Apply nightly for three nights. Patient not taking: Reported on 04/25/2020 04/21/20   Constant, Peggy, MD    Family History Family History  Adopted: Yes  Family history unknown: Yes    Social History Social History   Tobacco Use  . Smoking status: Never Smoker  . Smokeless tobacco: Never Used  Vaping Use  . Vaping Use: Never used  Substance Use Topics  . Alcohol use: Not Currently  . Drug use: Not Currently     Allergies   Patient has no known allergies.  Review of Systems Review of Systems  Cardiovascular: Positive for leg swelling. Negative for chest pain and palpitations.  Gastrointestinal: Negative for abdominal pain, diarrhea, nausea and vomiting.  Neurological: Positive for headaches. Negative for dizziness and light-headedness.     Physical Exam Triage Vital Signs ED Triage Vitals  Enc Vitals Group     BP 05/22/20 1357 135/84     Pulse Rate 05/22/20 1357 93     Resp 05/22/20 1357 18     Temp 05/22/20 1357 98.8 F (37.1 C)     Temp Source 05/22/20 1357 Oral     SpO2 05/22/20 1357 98 %     Weight --      Height --      Head Circumference --      Peak Flow --      Pain Score 05/22/20 1354 7     Pain Loc --      Pain Edu? --      Excl. in GC? --    No data found.  Updated Vital Signs BP 135/84 (BP Location: Left Arm)   Pulse 93   Temp 98.8  F (37.1 C) (Oral)   Resp 18   LMP 10/05/2019 (Exact Date)   SpO2 98%   Visual Acuity Right Eye Distance:   Left Eye Distance:   Bilateral Distance:    Right Eye Near:   Left Eye Near:    Bilateral Near:     Physical Exam Vitals and nursing note reviewed.  Constitutional:      General: She is not in acute distress.    Appearance: Normal appearance. She is well-developed. She is not ill-appearing, toxic-appearing or diaphoretic.  HENT:     Head: Normocephalic and atraumatic.     Nose: Nose normal.     Mouth/Throat:     Mouth: Mucous membranes are moist.     Pharynx: Oropharynx is clear.  Eyes:     Extraocular Movements: Extraocular movements intact.  Cardiovascular:     Rate and Rhythm: Normal rate and regular rhythm.  Pulmonary:     Effort: Pulmonary effort is normal. No respiratory distress.     Breath sounds: Normal breath sounds.  Abdominal:     Palpations: Abdomen is soft.     Tenderness: There is no abdominal tenderness.     Comments: Pregnant abdomen c/w gestational age [redacted] weeks  Musculoskeletal:        General: No swelling. Normal range of motion.     Cervical back: Normal range of motion.  Skin:    General: Skin is warm and dry.  Neurological:     General: No focal deficit present.     Mental Status: She is alert and oriented to person, place, and time.  Psychiatric:        Behavior: Behavior normal.      UC Treatments / Results  Labs (all labs ordered are listed, but only abnormal results are displayed) Labs Reviewed - No data to display  EKG   Radiology No results found.  Procedures Procedures (including critical care time)  Medications Ordered in UC Medications - No data to display  Initial Impression / Assessment and Plan / UC Course  I have reviewed the triage vital signs and the nursing notes.  Pertinent labs & imaging results that were available during my care of the patient were reviewed by me and considered in my medical decision  making (see chart for details).     Pt appears well, NAD Vitals: BP   135/84, otherwise, WNL Consulted with Dr. Beese, agrees pt is high risk this late in her pregnancy, and with hx of thrombocytopenia, [facility] Pt understanding and agreeable with plan. Pt feels safe driving herself to the hospital. Pt discharged in good condition.   Final Clinical Impressions(s) / UC Diagnoses   Final diagnoses:  Leg swelling in pregnancy in third trimester     Discharge Instructions      Because you are in your last trimester of pregnancy, close evaluation by an OB/GYN today is recommended.   Please proceed to the [facility] [place].      ED Prescriptions    None     PDMP not reviewed this encounter.   Phelps, Erin O, PA-C 05/22/20 1418  

## 2020-05-22 NOTE — Discharge Instructions (Signed)
How to Take Your Blood Pressure Blood pressure is a measurement of how strongly your blood is pressing against the walls of your arteries. Arteries are blood vessels that carry blood from your heart throughout your body. Your health care provider takes your blood pressure at each office visit. You can also take your own blood pressure at home with a blood pressure machine. You may need to take your own blood pressure:  To confirm a diagnosis of high blood pressure (hypertension).  To monitor your blood pressure over time.  To make sure your blood pressure medicine is working. Supplies needed: To take your blood pressure, you will need a blood pressure machine. You can buy a blood pressure machine, or blood pressure monitor, at most drugstores or online. There are several types of home blood pressure monitors. When choosing one, consider the following:  Choose a monitor that has an arm cuff.  Choose a cuff that wraps snugly around your upper arm. You should be able to fit only one finger between your arm and the cuff.  Do not choose a monitor that measures your blood pressure from your wrist or finger. Your health care provider can suggest a reliable monitor that will meet your needs. How to prepare To get the most accurate reading, avoid the following for 30 minutes before you check your blood pressure:  Drinking caffeine.  Drinking alcohol.  Eating.  Smoking.  Exercising. Five minutes before you check your blood pressure:  Empty your bladder.  Sit quietly without talking in a dining chair, rather than in a soft couch or armchair. How to take your blood pressure To check your blood pressure, follow the instructions in the manual that came with your blood pressure monitor. If you have a digital blood pressure monitor, the instructions may be as follows: 1. Sit up straight. 2. Place your feet on the floor. Do not cross your ankles or legs. 3. Rest your left arm at the level of  your heart on a table or desk or on the arm of a chair. 4. Pull up your shirt sleeve. 5. Wrap the blood pressure cuff around the upper part of your left arm, 1 inch (2.5 cm) above your elbow. It is best to wrap the cuff around bare skin. 6. Fit the cuff snugly around your arm. You should be able to place only one finger between the cuff and your arm. 7. Position the cord inside the groove of your elbow. 8. Press the power button. 9. Sit quietly while the cuff inflates and deflates. 10. Read the digital reading on the monitor screen and write it down (record it). 11. Wait 2-3 minutes, then repeat the steps, starting at step 1. What does my blood pressure reading mean? A blood pressure reading consists of a higher number over a lower number. Ideally, your blood pressure should be below 120/80. The first ("top") number is called the systolic pressure. It is a measure of the pressure in your arteries as your heart beats. The second ("bottom") number is called the diastolic pressure. It is a measure of the pressure in your arteries as the heart relaxes. Blood pressure is classified into four stages. The following are the stages for adults who do not have a short-term serious illness or a chronic condition. Systolic pressure and diastolic pressure are measured in a unit called mm Hg. Normal  Systolic pressure: below 120.  Diastolic pressure: below 80. Elevated  Systolic pressure: 120-129.  Diastolic pressure: below 80. Hypertension stage   1  Systolic pressure: 130-139.  Diastolic pressure: 80-89. Hypertension stage 2  Systolic pressure: 140 or above.  Diastolic pressure: 90 or above. You can have prehypertension or hypertension even if only the systolic or only the diastolic number in your reading is higher than normal. Follow these instructions at home:  Check your blood pressure as often as recommended by your health care provider.  Take your monitor to the next appointment with your  health care provider to make sure: ? That you are using it correctly. ? That it provides accurate readings.  Be sure you understand what your goal blood pressure numbers are.  Tell your health care provider if you are having any side effects from blood pressure medicine. Contact a health care provider if:  Your blood pressure is consistently high. Get help right away if:  Your systolic blood pressure is higher than 180.  Your diastolic blood pressure is higher than 110. This information is not intended to replace advice given to you by your health care provider. Make sure you discuss any questions you have with your health care provider. Document Revised: 10/17/2017 Document Reviewed: 04/12/2016 Elsevier Patient Education  2020 [employer]  

## 2020-05-22 NOTE — Discharge Instructions (Addendum)
  Because you are in your last trimester of pregnancy, close evaluation by an OB/GYN today is recommended.   Please proceed to the [facility].

## 2020-05-22 NOTE — MAU Note (Signed)
Presents with c/o H/A & swelling.  Reports H/A's are relieved with Tylenol, but H/A return.  Also reports increased swelling in feet, feet are throbbing secondary swelling. Took BP @ 0200 145/88 & @ 0700 140/83.  Endorses +FM.  Denies VB or LOF.

## 2020-05-22 NOTE — MAU Provider Note (Signed)
History     CSN: [id number]  Arrival date and time: 05/22/20 1448   First Provider Initiated Contact with Patient 05/22/20 1554      Chief Complaint  Patient presents with  . Headache  . Swelling   Pamela Doyle is a 24 y.o. G1P0 at 23w6dwho receives care at [facility]  She presents today for Elevated BP.  Patient reports that she took her blood pressure at 0200 after waking up with her feet "throbbing and swollen."  Patient states that it was 140/89 or 88.  She denies HA or visual disturbances at that time.  Patient reports that she retook it at 0700 and it was 145/83 with no new symptoms.  Patient endorses fetal movement and denies vaginal concerns including abnormal discharge, bleeding, or leaking.  Patient reports that she has had a headache "on and off for the past couple of days" that has been relieved with tylenol.  However, patient has no current HA.  Patient SO reports swelling has improved significantly since yesterday and patient agreeable.     OB History    Gravida  1   Para      Term      Preterm      AB      Living        SAB      TAB      Ectopic      Multiple      Live Births              Past Medical History:  Diagnosis Date  . Chronic kidney disease    mal rotated kidneys    Past Surgical History:  Procedure Laterality Date  . NO PAST SURGERIES      Family History  Adopted: Yes  Family history unknown: Yes    Social History   Tobacco Use  . Smoking status: Never Smoker  . Smokeless tobacco: Never Used  Vaping Use  . Vaping Use: Never used  Substance Use Topics  . Alcohol use: Not Currently    Comment: Socially  . Drug use: Not Currently    Allergies: No Known Allergies  Medications Prior to Admission  Medication Sig Dispense Refill Last Dose  . acetaminophen (TYLENOL) 325 MG tablet Take 650 mg by mouth every 6 (six) hours as needed for mild pain or headache.   05/21/2020 at Unknown time  . Multiple Vitamins-Minerals  (MULTIVITAMIN WITH MINERALS) tablet Take 1 tablet by mouth daily.   05/22/2020 at Unknown time  . Prenatal Vit-Fe Fumarate-FA (MULTIVITAMIN-PRENATAL) 27-0.8 MG TABS tablet Take 1 tablet by mouth daily at 12 noon.   05/22/2020 at Unknown time  . Blood Pressure KIT 1 Device by Does not apply route once a week. To be monitored weekly from home 1 kit 0   . docusate sodium (COLACE) 100 MG capsule Take 1 capsule (100 mg total) by mouth 2 (two) times daily as needed. 30 capsule 2   . polyethylene glycol (MIRALAX) 17 g packet Take 17 g by mouth daily. (Patient not taking: Reported on 05/22/2020) 14 each 0 Not Taking at Unknown time  . terconazole (TERAZOL 3) 0.8 % vaginal cream Place 1 applicator vaginally at bedtime. Apply nightly for three nights. (Patient not taking: Reported on 04/25/2020) 20 g 0 Not Taking at Unknown time  . witch hazel-glycerin (TUCKS) pad Apply 1 application topically as needed for itching or hemorrhoids. (Patient not taking: Reported on 05/22/2020) 40 each 12 Not Taking at Unknown time  Review of Systems  Constitutional: Negative for chills and fever.  Eyes: Negative for visual disturbance.  Respiratory: Negative for cough and shortness of breath.   Gastrointestinal: Negative for abdominal pain, nausea and vomiting.  Genitourinary: Negative for difficulty urinating, dysuria, pelvic pain, vaginal bleeding and vaginal discharge.  Musculoskeletal: Negative for back pain.  Neurological: Negative for dizziness, light-headedness and headaches.   Physical Exam   Blood pressure 118/72, pulse 89, temperature 98.2 F (36.8 C), temperature source Oral, resp. rate 18, height 5' 8" (1.727 m), weight 110.9 kg, last menstrual period 10/05/2019, SpO2 99 %.   Vitals:   05/22/20 1630 05/22/20 1645 05/22/20 1700 05/22/20 1705  BP: (!) 146/88 124/80 126/70   Pulse: 99 96 86   Resp:   20   Temp:   98.2 F (36.8 C)   TempSrc:   Oral   SpO2: 99% 99% 99% 99%  Weight:      Height:          Physical Exam Cardiovascular:     Rate and Rhythm: Regular rhythm.     Heart sounds: Normal heart sounds.  Pulmonary:     Breath sounds: Normal breath sounds.  Neurological:     Mental Status: She is oriented to person, place, and time.     Fetal Assessment 150 bpm, Mod Var, -Decels, +Accels Toco: No ctx graphed  MAU Course   Results for orders placed or performed during the hospital encounter of 05/22/20 (from the past 24 hour(s))  Urinalysis, Routine w reflex microscopic     Status: None   Collection Time: 05/22/20  3:30 PM  Result Value Ref Range   Color, Urine YELLOW YELLOW   APPearance CLEAR CLEAR   Specific Gravity, Urine 1.018 1.005 - 1.030   pH 7.0 5.0 - 8.0   Glucose, UA NEGATIVE NEGATIVE mg/dL   Hgb urine dipstick NEGATIVE NEGATIVE   Bilirubin Urine NEGATIVE NEGATIVE   Ketones, ur NEGATIVE NEGATIVE mg/dL   Protein, ur NEGATIVE NEGATIVE mg/dL   Nitrite NEGATIVE NEGATIVE   Leukocytes,Ua NEGATIVE NEGATIVE  Protein / creatinine ratio, urine     Status: None   Collection Time: 05/22/20  3:30 PM  Result Value Ref Range   Creatinine, Urine 145.31 mg/dL   Total Protein, Urine 13 mg/dL   Protein Creatinine Ratio 0.09 0.00 - 0.15 mg/mg[Cre]  CBC     Status: Abnormal   Collection Time: 05/22/20  4:11 PM  Result Value Ref Range   WBC 11.8 (H) 4.0 - 10.5 K/uL   RBC 3.83 (L) 3.87 - 5.11 MIL/uL   Hemoglobin 11.4 (L) 12.0 - 15.0 g/dL   HCT 34.5 (L) 36 - 46 %   MCV 90.1 80.0 - 100.0 fL   MCH 29.8 26.0 - 34.0 pg   MCHC 33.0 30.0 - 36.0 g/dL   RDW 12.9 11.5 - 15.5 %   Platelets 136 (L) 150 - 400 K/uL   nRBC 0.0 0.0 - 0.2 %  Comprehensive metabolic panel     Status: Abnormal   Collection Time: 05/22/20  4:11 PM  Result Value Ref Range   Sodium 136 135 - 145 mmol/L   Potassium 3.6 3.5 - 5.1 mmol/L   Chloride 108 98 - 111 mmol/L   CO2 20 (L) 22 - 32 mmol/L   Glucose, Bld 93 70 - 99 mg/dL   BUN 6 6 - 20 mg/dL   Creatinine, Ser 0.58 0.44 - 1.00 mg/dL   Calcium  8.7 (L) 8.9 - 10.3 mg/dL  Total Protein 5.8 (L) 6.5 - 8.1 g/dL   Albumin 2.5 (L) 3.5 - 5.0 g/dL   AST 13 (L) 15 - 41 U/L   ALT 10 0 - 44 U/L   Alkaline Phosphatase 103 38 - 126 U/L   Total Bilirubin 0.4 0.3 - 1.2 mg/dL   GFR calc non Af Amer >60 >60 mL/min   GFR calc Af Amer >60 >60 mL/min   Anion gap 8 5 - 15   No results found.  MDM Physical Exam Labs: CBC, CMP, PC Ratio Measure BPQ15 min EFM Assessment and Plan  24 year old G1P0  SIUP at 32.6weeks Cat I FT Edema Elevated BP at home  -POC reviewed. -Exam performed and findings discussed. -Questions addressed regarding risk/concern for PreEclampsia. -Reassured that current blood pressures remain normotensive. -Informed that review of labs shows gestational thrombocytopenia and labs repeated for this concern more than concern for current PreE.  -Patient and SO verbalizes understanding and without further questions or concerns.  -Labs ordered. -NST reactive. -Will monitor and await results.   Maryann Conners MSN, CNM 05/22/2020, 3:54 PM   Reassessment (4:59 PM)  -Labs return-Plts stable at [place]. -Provider to bedside and results discussed. -Patient with one elevated blood pressure, but otherwise normotensive. -Precautions given. -Instructed to keep appt as scheduled for Thursday. -Encouraged to call or return to MAU if symptoms worsen or with the onset of new symptoms. -Discharged to home in stable condition.  Maryann Conners MSN, CNM Advanced Practice Provider, [facility]

## 2020-05-25 ENCOUNTER — Ambulatory Visit (INDEPENDENT_AMBULATORY_CARE_PROVIDER_SITE_OTHER): Payer: Medicaid Other | Admitting: Obstetrics and Gynecology

## 2020-05-25 ENCOUNTER — Other Ambulatory Visit: Payer: Self-pay

## 2020-05-25 VITALS — BP 130/83 | HR 97 | Wt 226.0 lb

## 2020-05-25 DIAGNOSIS — R030 Elevated blood-pressure reading, without diagnosis of hypertension: Secondary | ICD-10-CM

## 2020-05-25 DIAGNOSIS — Z3A33 33 weeks gestation of pregnancy: Secondary | ICD-10-CM

## 2020-05-25 DIAGNOSIS — E669 Obesity, unspecified: Secondary | ICD-10-CM

## 2020-05-25 DIAGNOSIS — O99213 Obesity complicating pregnancy, third trimester: Secondary | ICD-10-CM

## 2020-05-25 NOTE — Progress Notes (Signed)
Prenatal Visit Note Date: 7//2021 Clinic: [facility]  Subjective:  Pamela Doyle is a 24 y.o. G1P0 at [redacted]w[redacted]d being seen today for ongoing prenatal care.  She is currently monitored for the following issues for this high-risk pregnancy and has Supervision of normal first pregnancy, antepartum; Biological false positive RPR test; Abnormal genetic test during pregnancy; Cystic fibrosis carrier; Obesity in pregnancy; and Gestational thrombocytopenia, second trimester (HCC) on their problem list.  Patient seen in MAU for HA and swelling and had mild range BP at home. Had one mild range BP in MAU but rest were negative and negative blood work except for stable gestational thrombocytopenia.  Patient states she's doing well and is w/o complaint today  Contractions: Irritability. Vag. Bleeding: None.  Movement: Present. Denies leaking of fluid.   The following portions of the patient's history were reviewed and updated as appropriate: allergies, current medications, past family history, past medical history, past social history, past surgical history and problem list. Problem list updated.  Objective:   Vitals:   05/25/20 1531  BP: 130/83  Pulse: 97  Weight: 226 lb (102.5 kg)    Fetal Status: Fetal Heart Rate (bpm): 144 Fundal Height: 33 cm Movement: Present  Presentation: Vertex  General:  Alert, oriented and cooperative. Patient is in no acute distress.  Skin: Skin is warm and dry. No rash noted.   Cardiovascular: Normal heart rate noted  Respiratory: Normal respiratory effort, no problems with respiration noted  Abdomen: Soft, gravid, appropriate for gestational age. Pain/Pressure: Present     Pelvic:  Cervical exam deferred        Extremities: Normal range of motion.  Edema: Trace  Mental Status: Normal mood and affect. Normal behavior. Normal judgment and thought content.   Urinalysis:      Assessment and Plan:  Pregnancy: G1P0 at [redacted]w[redacted]d  *Pregnancy: routine  care. Pt lives in [place] and would like to be seen there so will set up future visits there *Heme: rpt platetes q2wks *Transient hypertension: I recommend qwk BP visits (RN and then ROB) as pt feels more comfortable with this than home BP monitoring. Will set up at new site  CBC Latest Ref Rng & Units 05/22/2020 05/11/2020 04/12/2020  WBC 4.0 - 10.5 K/uL 11.8(H) 12.1(H) 9.4  Hemoglobin 12.0 - 15.0 g/dL 11.4(L) 11.9 11.8  Hematocrit 36 - 46 % 34.5(L) 34.8 34.7  Platelets 150 - 400 K/uL 136(L) 132(L) 135(L)    Preterm labor symptoms and general obstetric precautions including but not limited to vaginal bleeding, contractions, leaking of fluid and fetal movement were reviewed in detail with the patient. Please refer to After Visit Summary for other counseling recommendations.  Return in about 1 week (around 06/01/2020) for rn bp check. 2wk rob in person.   [place] Bing, MD

## 2020-05-26 DIAGNOSIS — R030 Elevated blood-pressure reading, without diagnosis of hypertension: Secondary | ICD-10-CM | POA: Insufficient documentation

## 2020-06-01 ENCOUNTER — Ambulatory Visit (INDEPENDENT_AMBULATORY_CARE_PROVIDER_SITE_OTHER): Payer: Medicaid Other

## 2020-06-01 ENCOUNTER — Other Ambulatory Visit: Payer: Self-pay

## 2020-06-01 VITALS — BP 118/71 | HR 98 | Wt 221.0 lb

## 2020-06-01 DIAGNOSIS — Z0130 Encounter for examination of blood pressure without abnormal findings: Secondary | ICD-10-CM

## 2020-06-01 NOTE — Progress Notes (Signed)
Pt here for BP check. BP 118/71. Pt has no complaints. Pt has ROB appt scheduled for 06/08/20.

## 2020-06-08 ENCOUNTER — Other Ambulatory Visit: Payer: Self-pay

## 2020-06-08 ENCOUNTER — Encounter: Payer: Self-pay | Admitting: Obstetrics and Gynecology

## 2020-06-08 ENCOUNTER — Ambulatory Visit (INDEPENDENT_AMBULATORY_CARE_PROVIDER_SITE_OTHER): Payer: Medicaid Other | Admitting: Obstetrics and Gynecology

## 2020-06-08 VITALS — BP 123/73 | HR 83 | Wt 227.0 lb

## 2020-06-08 DIAGNOSIS — D696 Thrombocytopenia, unspecified: Secondary | ICD-10-CM | POA: Diagnosis not present

## 2020-06-08 DIAGNOSIS — Z148 Genetic carrier of other disease: Secondary | ICD-10-CM

## 2020-06-08 DIAGNOSIS — Z3400 Encounter for supervision of normal first pregnancy, unspecified trimester: Secondary | ICD-10-CM

## 2020-06-08 DIAGNOSIS — Z3A35 35 weeks gestation of pregnancy: Secondary | ICD-10-CM

## 2020-06-08 DIAGNOSIS — E669 Obesity, unspecified: Secondary | ICD-10-CM

## 2020-06-08 DIAGNOSIS — R768 Other specified abnormal immunological findings in serum: Secondary | ICD-10-CM

## 2020-06-08 DIAGNOSIS — R030 Elevated blood-pressure reading, without diagnosis of hypertension: Secondary | ICD-10-CM

## 2020-06-08 DIAGNOSIS — O99213 Obesity complicating pregnancy, third trimester: Secondary | ICD-10-CM | POA: Diagnosis not present

## 2020-06-08 DIAGNOSIS — O99113 Other diseases of the blood and blood-forming organs and certain disorders involving the immune mechanism complicating pregnancy, third trimester: Secondary | ICD-10-CM | POA: Diagnosis not present

## 2020-06-08 NOTE — Progress Notes (Signed)
   PRENATAL VISIT NOTE  Subjective:  Pamela Doyle is a 24 y.o. G1P0 at [redacted]w[redacted]d being seen today for ongoing prenatal care.  She is currently monitored for the following issues for this high-risk pregnancy and has Supervision of normal first pregnancy, antepartum; Biological false positive RPR test; Abnormal genetic test during pregnancy; Cystic fibrosis carrier; Obesity in pregnancy; Gestational thrombocytopenia, second trimester (HCC); and Transient hypertension on their problem list.  Patient reports pelvic pressure.  Contractions: Irritability. Vag. Bleeding: None.  Movement: Present. Denies leaking of fluid.   The following portions of the patient's history were reviewed and updated as appropriate: allergies, current medications, past family history, past medical history, past social history, past surgical history and problem list.   Objective:   Vitals:   06/08/20 1512  BP: 123/73  Pulse: 83  Weight: (!) 227 lb (103 kg)    Fetal Status: Fetal Heart Rate (bpm): 138   Movement: Present     General:  Alert, oriented and cooperative. Patient is in no acute distress.  Skin: Skin is warm and dry. No rash noted.   Cardiovascular: Normal heart rate noted  Respiratory: Normal respiratory effort, no problems with respiration noted  Abdomen: Soft, gravid, appropriate for gestational age.  Pain/Pressure: Present     Pelvic: Cervical exam deferred        Extremities: Normal range of motion.  Edema: Trace  Mental Status: Normal mood and affect. Normal behavior. Normal judgment and thought content.   Assessment and Plan:  Pregnancy: G1P0 at [redacted]w[redacted]d  1. Supervision of normal first pregnancy, antepartum  2. Biological false positive RPR test Neg T pal  3. Transient hypertension Normotensive today  4. Gestational thrombocytopenia, second trimester (HCC) Stable, last 136  Preterm labor symptoms and general obstetric precautions including but not limited to vaginal bleeding, contractions,  leaking of fluid and fetal movement were reviewed in detail with the patient. Please refer to After Visit Summary for other counseling recommendations.   Return in about 1 week (around 06/15/2020) for in person, 36 week swabs, low OB.  Future Appointments  Date Time Provider Department Center  06/15/2020  2:30 PM Sharyon Cable, CNM [facility]    Conan Bowens, MD

## 2020-06-09 LAB — CBC
HCT: 38.5 % (ref 35.0–45.0)
Hemoglobin: 12.9 g/dL (ref 11.7–15.5)
MCH: 29.9 pg (ref 27.0–33.0)
MCHC: 33.5 g/dL (ref 32.0–36.0)
MCV: 89.3 fL (ref 80.0–100.0)
MPV: 12 fL (ref 7.5–12.5)
Platelets: 147 10*3/uL (ref 140–400)
RBC: 4.31 10*6/uL (ref 3.80–5.10)
RDW: 12.7 % (ref 11.0–15.0)
WBC: 13.4 10*3/uL — ABNORMAL HIGH (ref 3.8–10.8)

## 2020-06-15 ENCOUNTER — Other Ambulatory Visit (HOSPITAL_COMMUNITY)
Admission: RE | Admit: 2020-06-15 | Discharge: 2020-06-15 | Disposition: A | Discharge status: Home / Self Care | Payer: Medicaid Other | Source: Ambulatory Visit | Attending: Certified Nurse Midwife | Admitting: Certified Nurse Midwife

## 2020-06-15 ENCOUNTER — Other Ambulatory Visit: Payer: Self-pay

## 2020-06-15 ENCOUNTER — Ambulatory Visit (INDEPENDENT_AMBULATORY_CARE_PROVIDER_SITE_OTHER): Payer: Medicaid Other | Admitting: Certified Nurse Midwife

## 2020-06-15 ENCOUNTER — Encounter: Payer: Self-pay | Admitting: Certified Nurse Midwife

## 2020-06-15 VITALS — BP 133/82 | HR 88 | Wt 230.0 lb

## 2020-06-15 DIAGNOSIS — Z3A36 36 weeks gestation of pregnancy: Secondary | ICD-10-CM

## 2020-06-15 DIAGNOSIS — Z3480 Encounter for supervision of other normal pregnancy, unspecified trimester: Secondary | ICD-10-CM

## 2020-06-15 DIAGNOSIS — Z3403 Encounter for supervision of normal first pregnancy, third trimester: Secondary | ICD-10-CM

## 2020-06-15 DIAGNOSIS — D696 Thrombocytopenia, unspecified: Secondary | ICD-10-CM

## 2020-06-15 DIAGNOSIS — O99112 Other diseases of the blood and blood-forming organs and certain disorders involving the immune mechanism complicating pregnancy, second trimester: Secondary | ICD-10-CM

## 2020-06-15 LAB — OB RESULTS CONSOLE GBS: GBS: NEGATIVE

## 2020-06-15 NOTE — Progress Notes (Signed)
   PRENATAL VISIT NOTE  Subjective:  Pamela Doyle is a 24 y.o. G1P0 at [redacted]w[redacted]d being seen today for ongoing prenatal care.  She is currently monitored for the following issues for this low-risk pregnancy and has Supervision of normal first pregnancy, antepartum; Biological false positive RPR test; Abnormal genetic test during pregnancy; Cystic fibrosis carrier; Obesity in pregnancy; Gestational thrombocytopenia, second trimester (HCC); and Transient hypertension on their problem list.  Patient reports no complaints.  Contractions: Irritability. Vag. Bleeding: None.  Movement: Present. Denies leaking of fluid.   The following portions of the patient's history were reviewed and updated as appropriate: allergies, current medications, past family history, past medical history, past social history, past surgical history and problem list.   Objective:   Vitals:   06/15/20 1435  BP: (!) 133/82  Pulse: 88  Weight: (!) 230 lb (104.3 kg)    Fetal Status: Fetal Heart Rate (bpm): 141 Fundal Height: 37 cm Movement: Present  Presentation: Vertex  General:  Alert, oriented and cooperative. Patient is in no acute distress.  Skin: Skin is warm and dry. No rash noted.   Cardiovascular: Normal heart rate noted  Respiratory: Normal respiratory effort, no problems with respiration noted  Abdomen: Soft, gravid, appropriate for gestational age.  Pain/Pressure: Present     Pelvic: Cervical exam performed in the presence of a chaperone Dilation: 1.5 Effacement (%): 50 Station: Ballotable  Extremities: Normal range of motion.  Edema: Trace  Mental Status: Normal mood and affect. Normal behavior. Normal judgment and thought content.   Assessment and Plan:  Pregnancy: G1P0 at [redacted]w[redacted]d 1. Supervision of other normal pregnancy, antepartum - Patient doing well, no complaints  - patient reports that she is counting down until delivery  - patient is planning waterbirth, discussed exclusions for waterbirth, consent  signed today but discussed with patient that changes can occur where she would not be able to have waterbirth if BP increases, etc  - Routine prenatal care - Anticipatory guidance on upcoming appointments with next appointment in office for repeat CBC  - Educated and discussed use of EPO and RRT to stimulate continued cervical ripening at home  - continue to monitor BP closely  - Cervicovaginal ancillary only( [place]) - Culture, beta strep (group b only)  2. Gestational thrombocytopenia, second trimester (HCC) - Recent CBC on 7/22, PLT 147 - Discussed with patient that PLT is within normal range, will obtain one more CBC to assess   Preterm labor symptoms and general obstetric precautions including but not limited to vaginal bleeding, contractions, leaking of fluid and fetal movement were reviewed in detail with the patient. Please refer to After Visit Summary for other counseling recommendations.   Return in about 1 week (around 06/22/2020) for ROB in person, repeat CBC .  Future Appointments  Date Time Provider Department Center  06/21/2020 11:30 AM Lesly Dukes, MD [facility]    Sharyon Cable, CNM

## 2020-06-15 NOTE — Patient Instructions (Addendum)
  Guide for patients at [facility] Why consider waterbirth? . Gentle birth for babies  . Less pain medicine used in labor  . May allow for passive descent/less pushing  . May reduce perineal tears  . More mobility and instinctive maternal position changes  . Increased maternal relaxation  . Reduced blood pressure in labor   Is waterbirth safe? What are the risks of infection, drowning or other complications? . Infection:  Marland Kitchen Very low risk (3.7 % for tub vs 4.8% for bed)  . 7 in 8000 waterbirths with documented infection  . Poorly cleaned equipment most common cause  . Slightly lower group B strep transmission rate  . Drowning  . Maternal:  . Very low risk  . Related to seizures or fainting  . Newborn:  Marland Kitchen Very low risk. No evidence of increased risk of respiratory problems in multiple large studies  . Physiological protection from breathing under water  . Avoid underwater birth if there are any fetal complications  . Once baby's head is out of the water, keep it out.  . Birth complication  . Some reports of cord trauma, but risk decreased by bringing baby to surface gradually  . No evidence of increased risk of shoulder dystocia. Mothers can usually change positions faster in water than in a bed, possibly aiding the maneuvers to free the shoulder.  ? Things that would prevent you from having a waterbirth: . Unknown or Positive COVID-19 diagnosis upon admission to hospital  . Premature, <37wks  . Previous cesarean birth  . Presence of thick meconium-stained fluid  . Multiple gestation (Twins, triplets, etc.)  . Uncontrolled diabetes or gestational diabetes requiring medication  . Hypertension requiring medication or diagnosis of pre-eclampsia  . Heavy vaginal bleeding  . Non-reassuring fetal heart rate  . Active infection (MRSA, etc.). Group B Strep is NOT a contraindication for waterbirth.  . If your labor has to be induced and induction method requires  continuous monitoring of the baby's heart rate  . Other risks/issues identified by your obstetrical provider  Please remember that birth is unpredictable. Under certain unforeseeable circumstances your provider may advise against giving birth in the tub. These decisions will be made on a case-by-case basis and with the safety of you and your baby as our highest priority.  **Please remember that in order to have a waterbirth, you must test Negative to COVID-19 upon admission to the hospital.**   Cervical Ripening (to get your cervix ready for labor) : May try one or all:  Red Raspberry Leaf capsules:  two 300mg  or 400mg  tablets with each meal, 2-3 times a day  Potential Side Effects Of Raspberry Leaf:  Most women do not experience any side effects from drinking raspberry leaf tea. However, nausea and loose stools are possible   Evening Primrose Oil capsules: may take 1 to 3 capsules daily. May also prick one to release the oil and insert it into your vagina at night.  Some of the potential side effects:  Upset stomach  Loose stools or diarrhea  Headaches  Nausea  4 Dates a day (may taste better if warmed in microwave until soft). Found where raisins are in the grocery store

## 2020-06-16 LAB — CERVICOVAGINAL ANCILLARY ONLY
Chlamydia: NEGATIVE
Comment: NEGATIVE
Comment: NORMAL
Neisseria Gonorrhea: NEGATIVE

## 2020-06-18 LAB — CULTURE, BETA STREP (GROUP B ONLY)
MICRO NUMBER:: 10767979
SPECIMEN QUALITY:: ADEQUATE

## 2020-06-21 ENCOUNTER — Ambulatory Visit (INDEPENDENT_AMBULATORY_CARE_PROVIDER_SITE_OTHER): Payer: Medicaid Other | Admitting: Obstetrics & Gynecology

## 2020-06-21 ENCOUNTER — Other Ambulatory Visit: Payer: Self-pay

## 2020-06-21 VITALS — BP 114/73 | HR 84 | Wt 233.0 lb

## 2020-06-21 DIAGNOSIS — Z3400 Encounter for supervision of normal first pregnancy, unspecified trimester: Secondary | ICD-10-CM

## 2020-06-21 DIAGNOSIS — Z3A37 37 weeks gestation of pregnancy: Secondary | ICD-10-CM

## 2020-06-21 DIAGNOSIS — Z3403 Encounter for supervision of normal first pregnancy, third trimester: Secondary | ICD-10-CM

## 2020-06-22 LAB — CBC
HCT: 37.5 % (ref 35.0–45.0)
Hemoglobin: 12.6 g/dL (ref 11.7–15.5)
MCH: 29.9 pg (ref 27.0–33.0)
MCHC: 33.6 g/dL (ref 32.0–36.0)
MCV: 88.9 fL (ref 80.0–100.0)
MPV: 12 fL (ref 7.5–12.5)
Platelets: 150 10*3/uL (ref 140–400)
RBC: 4.22 10*6/uL (ref 3.80–5.10)
RDW: 13 % (ref 11.0–15.0)
WBC: 10.2 10*3/uL (ref 3.8–10.8)

## 2020-06-22 NOTE — Progress Notes (Signed)
   PRENATAL VISIT NOTE  Subjective:  Pamela Doyle is a 24 y.o. G1P0 at [redacted]w[redacted]d being seen today for ongoing prenatal care.  She is currently monitored for the following issues for this low-risk pregnancy and has Supervision of normal first pregnancy, antepartum; Biological false positive RPR test; Abnormal genetic test during pregnancy; Cystic fibrosis carrier; Obesity in pregnancy; Gestational thrombocytopenia, second trimester (HCC); and Transient hypertension on their problem list.  Patient reports no complaints.  Contractions: Irritability. Vag. Bleeding: None.  Movement: Present. Denies leaking of fluid.   The following portions of the patient's history were reviewed and updated as appropriate: allergies, current medications, past family history, past medical history, past social history, past surgical history and problem list.   Objective:   Vitals:   06/21/20 1129  BP: 114/73  Pulse: 84  Weight: 233 lb (105.7 kg)    Fetal Status: Fetal Heart Rate (bpm): 138   Movement: Present     General:  Alert, oriented and cooperative. Patient is in no acute distress.  Skin: Skin is warm and dry. No rash noted.   Cardiovascular: Normal heart rate noted  Respiratory: Normal respiratory effort, no problems with respiration noted  Abdomen: Soft, gravid, appropriate for gestational age.  Pain/Pressure: Present     Pelvic: Vertex  Extremities: Normal range of motion.  Edema: Trace  Mental Status: Normal mood and affect. Normal behavior. Normal judgment and thought content.   Assessment and Plan:  Pregnancy: G1P0 at [redacted]w[redacted]d 1. Supervision of normal first pregnancy, antepartum; Hx gestational thrombocytopenia - CBC today - planning waterbirth  Term labor symptoms and general obstetric precautions including but not limited to vaginal bleeding, contractions, leaking of fluid and fetal movement were reviewed in detail with the patient. Please refer to After Visit Summary for other counseling  recommendations.   Return in about 1 week (around 06/28/2020).  Future Appointments  Date Time Provider Department Center  06/30/2020 11:10 AM Donette Larry, CNM [facility]    Elsie Lincoln, MD

## 2020-06-30 ENCOUNTER — Other Ambulatory Visit (HOSPITAL_COMMUNITY)
Admission: RE | Admit: 2020-06-30 | Discharge: 2020-06-30 | Disposition: A | Discharge status: Home / Self Care | Payer: Medicaid Other | Source: Ambulatory Visit | Attending: Certified Nurse Midwife | Admitting: Certified Nurse Midwife

## 2020-06-30 ENCOUNTER — Ambulatory Visit (INDEPENDENT_AMBULATORY_CARE_PROVIDER_SITE_OTHER): Payer: Medicaid Other | Admitting: Certified Nurse Midwife

## 2020-06-30 ENCOUNTER — Other Ambulatory Visit: Payer: Self-pay

## 2020-06-30 VITALS — BP 135/82 | HR 82 | Wt 235.0 lb

## 2020-06-30 DIAGNOSIS — N898 Other specified noninflammatory disorders of vagina: Secondary | ICD-10-CM | POA: Diagnosis not present

## 2020-06-30 DIAGNOSIS — Z3A38 38 weeks gestation of pregnancy: Secondary | ICD-10-CM

## 2020-06-30 DIAGNOSIS — Z3400 Encounter for supervision of normal first pregnancy, unspecified trimester: Secondary | ICD-10-CM

## 2020-06-30 NOTE — Progress Notes (Signed)
Pt thinks she may have BV.  

## 2020-06-30 NOTE — Progress Notes (Signed)
Subjective:  Pamela Doyle is a 24 y.o. G1P0 at [redacted]w[redacted]d being seen today for ongoing prenatal care.  She is currently monitored for the following issues for this low-risk pregnancy and has Supervision of normal first pregnancy, antepartum; Biological false positive RPR test; Abnormal genetic test during pregnancy; Cystic fibrosis carrier; Obesity in pregnancy; Gestational thrombocytopenia, second trimester (HCC); and Transient hypertension on their problem list.  Patient reports vaginal odor no discharge or itching.  Contractions: Irritability. Vag. Bleeding: None.  Movement: Present. Denies leaking of fluid.   The following portions of the patient's history were reviewed and updated as appropriate: allergies, current medications, past family history, past medical history, past social history, past surgical history and problem list. Problem list updated.  Objective:   Vitals:   06/30/20 1102  BP: 135/82  Pulse: 82  Weight: 235 lb (106.6 kg)    Fetal Status: Fetal Heart Rate (bpm): 138 Fundal Height: 39 cm Movement: Present  Presentation: Vertex  General:  Alert, oriented and cooperative. Patient is in no acute distress.  Skin: Skin is warm and dry. No rash noted.   Cardiovascular: Normal heart rate noted  Respiratory: Normal respiratory effort, no problems with respiration noted  Abdomen: Soft, gravid, appropriate for gestational age. Pain/Pressure: Present     Pelvic: Vag. Bleeding: None Vag D/C Character: Thin   Cervical exam performed Dilation: 3 Effacement (%): 70 Station: -2  Extremities: Normal range of motion.  Edema: None  Mental Status: Normal mood and affect. Normal behavior. Normal judgment and thought content.   Urinalysis:      Assessment and Plan:  Pregnancy: G1P0 at [redacted]w[redacted]d  1. [redacted] weeks gestation of pregnancy  2. Vaginal discharge - Cervicovaginal ancillary only( [place])  3. Supervision of normal first pregnancy, antepartum  Term labor symptoms and general  obstetric precautions including but not limited to vaginal bleeding, contractions, leaking of fluid and fetal movement were reviewed in detail with the patient. Please refer to After Visit Summary for other counseling recommendations.  Return in about 1 week (around 07/07/2020).   Donette Larry, CNM

## 2020-06-30 NOTE — Patient Instructions (Signed)
Braxton Hicks Contractions °Contractions of the uterus can occur throughout pregnancy, but they are not always a sign that you are in labor. You may have practice contractions called Braxton Hicks contractions. These false labor contractions are sometimes confused with true labor. °What are Braxton Hicks contractions? °Braxton Hicks contractions are tightening movements that occur in the muscles of the uterus before labor. Unlike true labor contractions, these contractions do not result in opening (dilation) and thinning of the cervix. Toward the end of pregnancy (32-34 weeks), Braxton Hicks contractions can happen more often and may become stronger. These contractions are sometimes difficult to tell apart from true labor because they can be very uncomfortable. You should not feel embarrassed if you go to the hospital with false labor. °Sometimes, the only way to tell if you are in true labor is for your health care provider to look for changes in the cervix. The health care provider will do a physical exam and may monitor your contractions. If you are not in true labor, the exam should show that your cervix is not dilating and your water has not broken. °If there are no other health problems associated with your pregnancy, it is completely safe for you to be sent home with false labor. You may continue to have Braxton Hicks contractions until you go into true labor. °How to tell the difference between true labor and false labor °True labor °· Contractions last 30-70 seconds. °· Contractions become very regular. °· Discomfort is usually felt in the top of the uterus, and it spreads to the lower abdomen and low back. °· Contractions do not go away with walking. °· Contractions usually become more intense and increase in frequency. °· The cervix dilates and gets thinner. °False labor °· Contractions are usually shorter and not as strong as true labor contractions. °· Contractions are usually irregular. °· Contractions  are often felt in the front of the lower abdomen and in the groin. °· Contractions may go away when you walk around or change positions while lying down. °· Contractions get weaker and are shorter-lasting as time goes on. °· The cervix usually does not dilate or become thin. °Follow these instructions at home: ° °· Take over-the-counter and prescription medicines only as told by your health care provider. °· Keep up with your usual exercises and follow other instructions from your health care provider. °· Eat and drink lightly if you think you are going into labor. °· If Braxton Hicks contractions are making you uncomfortable: °? Change your position from lying down or resting to walking, or change from walking to resting. °? Sit and rest in a tub of warm water. °? Drink enough fluid to keep your urine pale yellow. Dehydration may cause these contractions. °? Do slow and deep breathing several times an hour. °· Keep all follow-up prenatal visits as told by your health care provider. This is important. °Contact a health care provider if: °· You have a fever. °· You have continuous pain in your abdomen. °Get help right away if: °· Your contractions become stronger, more regular, and closer together. °· You have fluid leaking or gushing from your vagina. °· You pass blood-tinged mucus (bloody show). °· You have bleeding from your vagina. °· You have low back pain that you never had before. °· You feel your baby’s head pushing down and causing pelvic pressure. °· Your baby is not moving inside you as much as it used to. °Summary °· Contractions that occur before labor are   called Braxton Hicks contractions, false labor, or practice contractions. °· Braxton Hicks contractions are usually shorter, weaker, farther apart, and less regular than true labor contractions. True labor contractions usually become progressively stronger and regular, and they become more frequent. °· Manage discomfort from Braxton Hicks contractions  by changing position, resting in a warm bath, drinking plenty of water, or practicing deep breathing. °This information is not intended to replace advice given to you by your health care provider. Make sure you discuss any questions you have with your health care provider. °Document Revised: 10/17/2017 Document Reviewed: 03/20/2017 °Elsevier Patient Education © 2020 [employer] ° °

## 2020-07-04 LAB — CERVICOVAGINAL ANCILLARY ONLY
Bacterial Vaginitis (gardnerella): NEGATIVE
Comment: NEGATIVE

## 2020-07-05 ENCOUNTER — Other Ambulatory Visit: Payer: Self-pay

## 2020-07-05 ENCOUNTER — Ambulatory Visit (INDEPENDENT_AMBULATORY_CARE_PROVIDER_SITE_OTHER): Payer: Medicaid Other | Admitting: Advanced Practice Midwife

## 2020-07-05 VITALS — BP 115/81 | HR 84 | Wt 238.0 lb

## 2020-07-05 DIAGNOSIS — Z3400 Encounter for supervision of normal first pregnancy, unspecified trimester: Secondary | ICD-10-CM

## 2020-07-05 DIAGNOSIS — R030 Elevated blood-pressure reading, without diagnosis of hypertension: Secondary | ICD-10-CM

## 2020-07-05 DIAGNOSIS — D696 Thrombocytopenia, unspecified: Secondary | ICD-10-CM

## 2020-07-05 DIAGNOSIS — O99210 Obesity complicating pregnancy, unspecified trimester: Secondary | ICD-10-CM

## 2020-07-05 DIAGNOSIS — O99112 Other diseases of the blood and blood-forming organs and certain disorders involving the immune mechanism complicating pregnancy, second trimester: Secondary | ICD-10-CM

## 2020-07-05 DIAGNOSIS — Z3A39 39 weeks gestation of pregnancy: Secondary | ICD-10-CM

## 2020-07-05 NOTE — Patient Instructions (Signed)

## 2020-07-05 NOTE — Progress Notes (Addendum)
   PRENATAL VISIT NOTE  Subjective:  Pamela Doyle is a 24 y.o. G1P0 at [redacted]w[redacted]d being seen today for ongoing prenatal care.  She is currently monitored for the following issues for this low-risk pregnancy and has Supervision of normal first pregnancy, antepartum; Biological false positive RPR test; Abnormal genetic test during pregnancy; Cystic fibrosis carrier; Obesity in pregnancy; Gestational thrombocytopenia, second trimester (HCC); and Transient hypertension on their problem list.  Patient reports occasional contractions.  Contractions: Irritability. Vag. Bleeding: None.  Movement: Present. Denies leaking of fluid.   The following portions of the patient's history were reviewed and updated as appropriate: allergies, current medications, past family history, past medical history, past social history, past surgical history and problem list. Problem list updated.  Objective:   Vitals:   07/05/20 0851  BP: 115/81  Pulse: 84  Weight: 238 lb (108 kg)    Fetal Status: Fetal Heart Rate (bpm): 130 Fundal Height: 39 cm Movement: Present  Presentation: Vertex  General:  Alert, oriented and cooperative. Patient is in no acute distress.  Skin: Skin is warm and dry. No rash noted.   Cardiovascular: Normal heart rate noted  Respiratory: Normal respiratory effort, no problems with respiration noted  Abdomen: Soft, gravid, appropriate for gestational age.  Pain/Pressure: Present     Pelvic: Cervical exam performed Dilation: 3 Effacement (%): 70 Station: -2  Extremities: Normal range of motion.  Edema: None  Mental Status: Normal mood and affect. Normal behavior. Normal judgment and thought content.   Assessment and Plan:  Pregnancy: G1P0 at [redacted]w[redacted]d  1. Supervision of normal first pregnancy, antepartum - Discussed membrane sweeping today. Reviewed Cochrane Review data on membrane sweeping at 39 wks and then at EDD. Reviewed risk of cramping, contractions, bleeding and ROM. Answered patient questions  and she agreed to proceed with procedure. - Continues to desire waterbirth. S/p consent, class and CNM visits - PDIOL requested for 40+3 or 40+4. Orders placed - Membrane sweep, NST next visit  2. Transient hypertension - Elevated BP x 1 = 146/88 in MAU 05/22/2020 - 130s/80s during prenatal visit x 2 - Significant time spent discussing waterbirth contraindications in setting of previous elevated BP. - Reviewed administrative email sent this week to [employer] regarding revised contraindications for waterbirth - Discussed methods for facilitating birth center-like experience even without waterbirth  - Patient counseled that repeat elevated BP would supercede PDIOL - Patient and husband verbalized understanding  3. Gestational thrombocytopenia, second trimester [facility]) - Platelets 150 06/21/2020  Term labor symptoms and general obstetric precautions including but not limited to vaginal bleeding, contractions, leaking of fluid and fetal movement were reviewed in detail with the patient. Please refer to After Visit Summary for other counseling recommendations.  Return in about 1 week (around 07/12/2020).  Total visit time 30 minutes  Future Appointments  Date Time Provider Department Center  07/11/2020  3:45 PM Clifton Bing, MD [facility]    [place], [place]

## 2020-07-06 ENCOUNTER — Other Ambulatory Visit (HOSPITAL_COMMUNITY): Payer: Self-pay | Admitting: Advanced Practice Midwife

## 2020-07-07 ENCOUNTER — Encounter: Payer: Medicaid Other | Admitting: Obstetrics and Gynecology

## 2020-07-10 ENCOUNTER — Telehealth: Payer: Self-pay | Admitting: *Deleted

## 2020-07-10 ENCOUNTER — Ambulatory Visit (INDEPENDENT_AMBULATORY_CARE_PROVIDER_SITE_OTHER): Payer: Medicaid Other | Admitting: Obstetrics and Gynecology

## 2020-07-10 ENCOUNTER — Other Ambulatory Visit: Payer: Self-pay

## 2020-07-10 ENCOUNTER — Encounter: Payer: Medicaid Other | Admitting: Obstetrics and Gynecology

## 2020-07-10 VITALS — BP 121/83 | HR 88 | Wt 240.0 lb

## 2020-07-10 DIAGNOSIS — O368130 Decreased fetal movements, third trimester, not applicable or unspecified: Secondary | ICD-10-CM

## 2020-07-10 DIAGNOSIS — O99213 Obesity complicating pregnancy, third trimester: Secondary | ICD-10-CM

## 2020-07-10 DIAGNOSIS — O368131 Decreased fetal movements, third trimester, fetus 1: Secondary | ICD-10-CM

## 2020-07-10 DIAGNOSIS — Z3403 Encounter for supervision of normal first pregnancy, third trimester: Secondary | ICD-10-CM

## 2020-07-10 DIAGNOSIS — O99210 Obesity complicating pregnancy, unspecified trimester: Secondary | ICD-10-CM

## 2020-07-10 DIAGNOSIS — D696 Thrombocytopenia, unspecified: Secondary | ICD-10-CM

## 2020-07-10 DIAGNOSIS — Z3400 Encounter for supervision of normal first pregnancy, unspecified trimester: Secondary | ICD-10-CM

## 2020-07-10 DIAGNOSIS — Z3A39 39 weeks gestation of pregnancy: Secondary | ICD-10-CM | POA: Diagnosis not present

## 2020-07-10 DIAGNOSIS — O99112 Other diseases of the blood and blood-forming organs and certain disorders involving the immune mechanism complicating pregnancy, second trimester: Secondary | ICD-10-CM

## 2020-07-10 NOTE — Telephone Encounter (Signed)
Pt called stating she had not felt her baby move since last night. She tried this AM laying down and drinking something cold and still no movement, told pt to come right over to the office and we will put her on the monitor and see what we need to do from there. Pt verbalizes and understands

## 2020-07-10 NOTE — Progress Notes (Signed)
Prenatal Visit Note Date: 07/10/2020 Clinic: [facility]  Subjective:  Pamela Doyle is a 24 y.o. G1P0 at [redacted]w[redacted]d being seen today for ongoing prenatal care.  She is currently monitored for the following issues for this low-risk pregnancy and has Supervision of normal first pregnancy, antepartum; Biological false positive RPR test; Abnormal genetic test during pregnancy; Cystic fibrosis carrier; Obesity in pregnancy; Gestational thrombocytopenia, second trimester (HCC); and Transient hypertension on their problem list.  Patient reports decreased FM but feeling baby move more during the nst.   Contractions: Irregular. Vag. Bleeding: None.  Movement: Present. Denies leaking of fluid.   The following portions of the patient's history were reviewed and updated as appropriate: allergies, current medications, past family history, past medical history, past social history, past surgical history and problem list. Problem list updated.  Objective:   Vitals:   07/10/20 1437  BP: 121/83  Pulse: 88  Weight: 240 lb (108.9 kg)    Fetal Status: Fetal Heart Rate (bpm): NST   Movement: Present  Presentation: Vertex  General:  Alert, oriented and cooperative. Patient is in no acute distress.  Skin: Skin is warm and dry. No rash noted.   Cardiovascular: Normal heart rate noted  Respiratory: Normal respiratory effort, no problems with respiration noted  Abdomen: Soft, gravid, appropriate for gestational age. Pain/Pressure: Present     Pelvic:  Cervical exam deferred        Extremities: Normal range of motion.  Edema: None  Mental Status: Normal mood and affect. Normal behavior. Normal judgment and thought content.   Urinalysis:      Assessment and Plan:  Pregnancy: G1P0 at [redacted]w[redacted]d  1. Decreased fetal movements in third trimester, single or unspecified fetus Reactive nst. 125 baseline, +accels, no decel, mod variability. One contraction in 20 minutes  2. Gestational thrombocytopenia,  second trimester (HCC) Normal at last check on 8/4  3. Supervision of normal first pregnancy, antepartum Already set up for post dates IOL on friday  Term labor symptoms and general obstetric precautions including but not limited to vaginal bleeding, contractions, leaking of fluid and fetal movement were reviewed in detail with the patient. Please refer to After Visit Summary for other counseling recommendations.   RTC: 19m pp visit   Bing, MD

## 2020-07-11 ENCOUNTER — Encounter: Payer: Medicaid Other | Admitting: Obstetrics and Gynecology

## 2020-07-11 ENCOUNTER — Telehealth (HOSPITAL_COMMUNITY): Payer: Self-pay | Admitting: *Deleted

## 2020-07-11 NOTE — Telephone Encounter (Signed)
Preadmission screen  

## 2020-07-12 ENCOUNTER — Inpatient Hospital Stay (HOSPITAL_COMMUNITY)
Admission: AD | Admit: 2020-07-12 | Discharge: 2020-07-15 | DRG: 806 | Disposition: A | Discharge status: Home / Self Care | Payer: Medicaid Other | Attending: Obstetrics & Gynecology | Admitting: Obstetrics & Gynecology

## 2020-07-12 ENCOUNTER — Encounter (HOSPITAL_COMMUNITY): Payer: Self-pay | Admitting: Obstetrics and Gynecology

## 2020-07-12 ENCOUNTER — Other Ambulatory Visit: Payer: Self-pay | Admitting: Advanced Practice Midwife

## 2020-07-12 ENCOUNTER — Other Ambulatory Visit (HOSPITAL_COMMUNITY)
Admission: RE | Admit: 2020-07-12 | Discharge: 2020-07-12 | Disposition: A | Discharge status: Home / Self Care | Payer: Medicaid Other | Source: Ambulatory Visit | Attending: Family Medicine | Admitting: Family Medicine

## 2020-07-12 ENCOUNTER — Other Ambulatory Visit: Payer: Self-pay

## 2020-07-12 DIAGNOSIS — O99214 Obesity complicating childbirth: Secondary | ICD-10-CM | POA: Diagnosis present

## 2020-07-12 DIAGNOSIS — O480 Post-term pregnancy: Secondary | ICD-10-CM | POA: Diagnosis present

## 2020-07-12 DIAGNOSIS — Z3A40 40 weeks gestation of pregnancy: Secondary | ICD-10-CM

## 2020-07-12 DIAGNOSIS — Z141 Cystic fibrosis carrier: Secondary | ICD-10-CM | POA: Diagnosis not present

## 2020-07-12 DIAGNOSIS — D696 Thrombocytopenia, unspecified: Secondary | ICD-10-CM | POA: Diagnosis present

## 2020-07-12 DIAGNOSIS — Z01812 Encounter for preprocedural laboratory examination: Secondary | ICD-10-CM | POA: Insufficient documentation

## 2020-07-12 DIAGNOSIS — O285 Abnormal chromosomal and genetic finding on antenatal screening of mother: Secondary | ICD-10-CM | POA: Diagnosis present

## 2020-07-12 DIAGNOSIS — O4292 Full-term premature rupture of membranes, unspecified as to length of time between rupture and onset of labor: Principal | ICD-10-CM | POA: Diagnosis present

## 2020-07-12 DIAGNOSIS — Z20822 Contact with and (suspected) exposure to covid-19: Secondary | ICD-10-CM | POA: Insufficient documentation

## 2020-07-12 DIAGNOSIS — O26893 Other specified pregnancy related conditions, third trimester: Secondary | ICD-10-CM | POA: Diagnosis present

## 2020-07-12 DIAGNOSIS — O99210 Obesity complicating pregnancy, unspecified trimester: Secondary | ICD-10-CM | POA: Diagnosis present

## 2020-07-12 DIAGNOSIS — O9912 Other diseases of the blood and blood-forming organs and certain disorders involving the immune mechanism complicating childbirth: Secondary | ICD-10-CM | POA: Diagnosis present

## 2020-07-12 DIAGNOSIS — E669 Obesity, unspecified: Secondary | ICD-10-CM | POA: Diagnosis present

## 2020-07-12 DIAGNOSIS — Z3400 Encounter for supervision of normal first pregnancy, unspecified trimester: Secondary | ICD-10-CM

## 2020-07-12 DIAGNOSIS — D6959 Other secondary thrombocytopenia: Secondary | ICD-10-CM | POA: Diagnosis present

## 2020-07-12 DIAGNOSIS — Z8759 Personal history of other complications of pregnancy, childbirth and the puerperium: Secondary | ICD-10-CM | POA: Diagnosis present

## 2020-07-12 DIAGNOSIS — R768 Other specified abnormal immunological findings in serum: Secondary | ICD-10-CM | POA: Diagnosis present

## 2020-07-12 LAB — SARS CORONAVIRUS 2 (TAT 6-24 HRS): SARS Coronavirus 2: NEGATIVE

## 2020-07-12 LAB — COMPREHENSIVE METABOLIC PANEL
ALT: 8 U/L (ref 0–44)
AST: 13 U/L — ABNORMAL LOW (ref 15–41)
Albumin: 2.7 g/dL — ABNORMAL LOW (ref 3.5–5.0)
Alkaline Phosphatase: 168 U/L — ABNORMAL HIGH (ref 38–126)
Anion gap: 9 (ref 5–15)
BUN: 10 mg/dL (ref 6–20)
CO2: 20 mmol/L — ABNORMAL LOW (ref 22–32)
Calcium: 8.3 mg/dL — ABNORMAL LOW (ref 8.9–10.3)
Chloride: 110 mmol/L (ref 98–111)
Creatinine, Ser: 0.68 mg/dL (ref 0.44–1.00)
GFR calc Af Amer: >60 mL/min (ref >60)
GFR calc non Af Amer: >60 mL/min (ref >60)
Glucose, Bld: 99 mg/dL (ref 70–99)
Potassium: 3.9 mmol/L (ref 3.5–5.1)
Sodium: 139 mmol/L (ref 135–145)
Total Bilirubin: 0.4 mg/dL (ref 0.3–1.2)
Total Protein: 6 g/dL — ABNORMAL LOW (ref 6.5–8.1)

## 2020-07-12 LAB — CBC
HCT: 34.6 % — ABNORMAL LOW (ref 36.0–46.0)
Hemoglobin: 11.1 g/dL — ABNORMAL LOW (ref 12.0–15.0)
MCH: 28.9 pg (ref 26.0–34.0)
MCHC: 32.1 g/dL (ref 30.0–36.0)
MCV: 90.1 fL (ref 80.0–100.0)
Platelets: 132 10*3/uL — ABNORMAL LOW (ref 150–400)
RBC: 3.84 MIL/uL — ABNORMAL LOW (ref 3.87–5.11)
RDW: 14 % (ref 11.5–15.5)
WBC: 12.5 10*3/uL — ABNORMAL HIGH (ref 4.0–10.5)
nRBC: 0 % (ref 0.0–0.2)

## 2020-07-12 LAB — POCT FERN TEST: POCT Fern Test: POSITIVE

## 2020-07-12 MED ORDER — LACTATED RINGERS IV SOLN: INTRAVENOUS | Status: DC

## 2020-07-12 MED ORDER — FLEET ENEMA 7-19 GM/118ML RE ENEM: 1.0000 | ENEMA | RECTAL | Status: DC | PRN

## 2020-07-12 MED ORDER — ACETAMINOPHEN 325 MG PO TABS: 650.0000 mg | ORAL_TABLET | ORAL | Status: DC | PRN

## 2020-07-12 MED ORDER — OXYCODONE-ACETAMINOPHEN 5-325 MG PO TABS: 2.0000 | ORAL_TABLET | ORAL | Status: DC | PRN

## 2020-07-12 MED ORDER — OXYTOCIN BOLUS FROM INFUSION
333.0000 mL | Freq: Once | INTRAVENOUS | Status: AC
  Administered 2020-07-14: 333 mL via INTRAVENOUS

## 2020-07-12 MED ORDER — ONDANSETRON HCL 4 MG/2ML IJ SOLN: 4.0000 mg | Freq: Four times a day (QID) | INTRAMUSCULAR | Status: DC | PRN

## 2020-07-12 MED ORDER — OXYTOCIN-SODIUM CHLORIDE 30-0.9 UT/500ML-% IV SOLN: 1.0000 m[IU]/min | INTRAVENOUS | Status: DC

## 2020-07-12 MED ORDER — TERBUTALINE SULFATE 1 MG/ML IJ SOLN: 0.2500 mg | Freq: Once | INTRAMUSCULAR | Status: DC | PRN

## 2020-07-12 MED ORDER — LIDOCAINE HCL (PF) 1 % IJ SOLN: 30.0000 mL | INTRAMUSCULAR | Status: DC | PRN

## 2020-07-12 MED ORDER — OXYCODONE-ACETAMINOPHEN 5-325 MG PO TABS: 1.0000 | ORAL_TABLET | ORAL | Status: DC | PRN

## 2020-07-12 MED ORDER — LACTATED RINGERS IV SOLN: 500.0000 mL | INTRAVENOUS | Status: DC | PRN

## 2020-07-12 MED ORDER — SOD CITRATE-CITRIC ACID 500-334 MG/5ML PO SOLN: 30.0000 mL | ORAL | Status: DC | PRN

## 2020-07-12 MED ORDER — OXYTOCIN-SODIUM CHLORIDE 30-0.9 UT/500ML-% IV SOLN: 2.5000 [IU]/h | INTRAVENOUS | Status: DC

## 2020-07-12 NOTE — MAU Note (Signed)
Pt reports gush of fluid at 2000.   Pt reports clear fluid.   Pt reports leaking now.   Pt reports ctx's every 5 minutes.   Denies vaginal bleeding.   Reports +FM

## 2020-07-12 NOTE — H&P (Addendum)
Pamela Doyle is a 24 y.o. female G1 at 58.1wks by LMP c/w early scan presenting for leaking fluid since 2000. Having irreg ctx; denies bleeding. She denies H/A, N/V or visual disturbances. Her preg has been followed by the [facility] office and has been remarkable for:  * biological false positive RPR (neg T. Pal and titer 1:1) * gest thrombocytopenia  * cystic fibrosis carrier * transient htn (BP in MAU 7/5 146/88, sandwiched b/t 1-teens/120s/70-80) * desires waterbirth- has fulfilled requirements  OB History    Gravida  1   Para      Term      Preterm      AB      Living        SAB      TAB      Ectopic      Multiple      Live Births             Past Medical History:  Diagnosis Date  . Chronic kidney disease    mal rotated kidneys   Past Surgical History:  Procedure Laterality Date  . NO PAST SURGERIES     Family History: She was adopted. Family history is unknown by patient. Social History:  reports that she has never smoked. She has never used smokeless tobacco. She reports previous alcohol use. She reports previous drug use.     Maternal Diabetes: No Genetic Screening: Normal Maternal Ultrasounds/Referrals: Normal Fetal Ultrasounds or other Referrals:  None Maternal Substance Abuse:  No Significant Maternal Medications:  None Significant Maternal Lab Results:  Group B Strep negative Other Comments:  None  Review of Systems History   Blood pressure 123/74, pulse 83, temperature 98.5 F (36.9 C), temperature source Oral, resp. rate 16, last menstrual period 10/05/2019, SpO2 99 %. Exam Physical Exam Constitutional:      Appearance: Normal appearance.  HENT:     Head: Normocephalic.     Mouth/Throat:     Mouth: Mucous membranes are moist.  Abdominal:     Comments: EFM 130s, +accels, no decels Ctx irreg 4-8 mins Vtx verifed by b/s ultrasound  Musculoskeletal:        General: Normal range of motion.     Cervical back: Normal range of  motion.  Skin:    General: Skin is warm and dry.  Neurological:     General: No focal deficit present.     Mental Status: She is alert and oriented to person, place, and time.  Psychiatric:        Mood and Affect: Mood normal.        Behavior: Behavior normal.     Prenatal labs: ABO, Rh: B+ (07/12/20) Antibody: neg (07/12/20) Rubella: 1.04 (02/03 1049) RPR: Non Reactive (05/26 0832)  HBsAg: Negative (02/03 1049)  HIV: Non Reactive (05/26 0830)  GBS:   negative (06/15/20)  Assessment/Plan: IUP@40 .1wks PROM GBS neg  Admit to Labor & Delivery Expectant management for now Watch BPs/platelets; may labor/deliver in water as long as she doesn't risk out Anticipate vag del   Arabella Merles CNM 07/12/2020, 11:59 PM

## 2020-07-13 ENCOUNTER — Inpatient Hospital Stay (HOSPITAL_COMMUNITY): Payer: Medicaid Other | Admitting: Anesthesiology

## 2020-07-13 ENCOUNTER — Inpatient Hospital Stay (HOSPITAL_COMMUNITY): Admission: RE | Admit: 2020-07-13 | Payer: Medicaid Other | Source: Home / Self Care | Admitting: Family Medicine

## 2020-07-13 ENCOUNTER — Encounter (HOSPITAL_COMMUNITY): Payer: Self-pay | Admitting: Obstetrics & Gynecology

## 2020-07-13 ENCOUNTER — Inpatient Hospital Stay (HOSPITAL_COMMUNITY): Payer: Medicaid Other

## 2020-07-13 LAB — CBC
HCT: 33.9 % — ABNORMAL LOW (ref 36.0–46.0)
HCT: 37.3 % (ref 36.0–46.0)
Hemoglobin: 11.1 g/dL — ABNORMAL LOW (ref 12.0–15.0)
Hemoglobin: 12.3 g/dL (ref 12.0–15.0)
MCH: 29.7 pg (ref 26.0–34.0)
MCH: 29.8 pg (ref 26.0–34.0)
MCHC: 32.7 g/dL (ref 30.0–36.0)
MCHC: 33 g/dL (ref 30.0–36.0)
MCV: 90.6 fL (ref 80.0–100.0)
MCV: 90.3 fL (ref 80.0–100.0)
Platelets: 133 10*3/uL — ABNORMAL LOW (ref 150–400)
Platelets: 122 10*3/uL — ABNORMAL LOW (ref 150–400)
RBC: 3.74 MIL/uL — ABNORMAL LOW (ref 3.87–5.11)
RBC: 4.13 MIL/uL (ref 3.87–5.11)
RDW: 14 % (ref 11.5–15.5)
RDW: 13.9 % (ref 11.5–15.5)
WBC: 10.5 10*3/uL (ref 4.0–10.5)
WBC: 17.6 10*3/uL — ABNORMAL HIGH (ref 4.0–10.5)
nRBC: 0 % (ref 0.0–0.2)
nRBC: 0 % (ref 0.0–0.2)

## 2020-07-13 LAB — PROTEIN / CREATININE RATIO, URINE
Creatinine, Urine: 207.38 mg/dL
Protein Creatinine Ratio: 0.13 mg/mg{Cre} (ref 0.00–0.15)
Total Protein, Urine: 26 mg/dL

## 2020-07-13 LAB — COMPREHENSIVE METABOLIC PANEL
ALT: 10 U/L (ref 0–44)
AST: 13 U/L — ABNORMAL LOW (ref 15–41)
Albumin: 2.8 g/dL — ABNORMAL LOW (ref 3.5–5.0)
Alkaline Phosphatase: 198 U/L — ABNORMAL HIGH (ref 38–126)
Anion gap: 11 (ref 5–15)
BUN: 7 mg/dL (ref 6–20)
CO2: 17 mmol/L — ABNORMAL LOW (ref 22–32)
Calcium: 8.7 mg/dL — ABNORMAL LOW (ref 8.9–10.3)
Chloride: 109 mmol/L (ref 98–111)
Creatinine, Ser: 0.6 mg/dL (ref 0.44–1.00)
GFR calc Af Amer: >60 mL/min (ref >60)
GFR calc non Af Amer: >60 mL/min (ref >60)
Glucose, Bld: 90 mg/dL (ref 70–99)
Potassium: 3.8 mmol/L (ref 3.5–5.1)
Sodium: 137 mmol/L (ref 135–145)
Total Bilirubin: 0.2 mg/dL — ABNORMAL LOW (ref 0.3–1.2)
Total Protein: 6.4 g/dL — ABNORMAL LOW (ref 6.5–8.1)

## 2020-07-13 LAB — TYPE AND SCREEN
ABO/RH(D): B POS
Antibody Screen: NEGATIVE

## 2020-07-13 MED ORDER — EPHEDRINE 5 MG/ML INJ: 10.0000 mg | INTRAVENOUS | Status: DC | PRN

## 2020-07-13 MED ORDER — PHENYLEPHRINE 40 MCG/ML (10ML) SYRINGE FOR IV PUSH (FOR BLOOD PRESSURE SUPPORT)
80.0000 ug | PREFILLED_SYRINGE | INTRAVENOUS | Status: DC | PRN
  Filled 2020-07-13 (×2): qty 10

## 2020-07-13 MED ORDER — FENTANYL-BUPIVACAINE-NACL 0.5-0.125-0.9 MG/250ML-% EP SOLN
12.0000 mL/h | EPIDURAL | Status: DC | PRN
  Filled 2020-07-13 (×2): qty 250

## 2020-07-13 MED ORDER — LIDOCAINE HCL (PF) 1 % IJ SOLN
INTRAMUSCULAR | Status: DC | PRN
  Administered 2020-07-13 (×2): 4 mL via EPIDURAL

## 2020-07-13 MED ORDER — TERBUTALINE SULFATE 1 MG/ML IJ SOLN: 0.2500 mg | Freq: Once | INTRAMUSCULAR | Status: DC | PRN

## 2020-07-13 MED ORDER — FENTANYL CITRATE (PF) 100 MCG/2ML IJ SOLN
100.0000 ug | INTRAMUSCULAR | Status: DC | PRN
  Administered 2020-07-13: 100 ug via INTRAVENOUS
  Filled 2020-07-13: qty 2

## 2020-07-13 MED ORDER — PHENYLEPHRINE 40 MCG/ML (10ML) SYRINGE FOR IV PUSH (FOR BLOOD PRESSURE SUPPORT): 80.0000 ug | PREFILLED_SYRINGE | INTRAVENOUS | Status: DC | PRN

## 2020-07-13 MED ORDER — SODIUM CHLORIDE (PF) 0.9 % IJ SOLN
INTRAMUSCULAR | Status: DC | PRN
Start: 2020-07-13 — End: 2020-07-14
  Administered 2020-07-13: 12 mL/h via EPIDURAL

## 2020-07-13 MED ORDER — OXYTOCIN-SODIUM CHLORIDE 30-0.9 UT/500ML-% IV SOLN
1.0000 m[IU]/min | INTRAVENOUS | Status: DC
  Administered 2020-07-13: 2 m[IU]/min via INTRAVENOUS
  Filled 2020-07-13: qty 500

## 2020-07-13 MED ORDER — LACTATED RINGERS IV SOLN: 500.0000 mL | Freq: Once | INTRAVENOUS | Status: DC

## 2020-07-13 MED ORDER — DIPHENHYDRAMINE HCL 50 MG/ML IJ SOLN: 12.5000 mg | INTRAMUSCULAR | Status: DC | PRN

## 2020-07-13 NOTE — Anesthesia Procedure Notes (Addendum)
Epidural Patient location during procedure: OB Start time: 07/13/2020 7:49 PM End time: 07/13/2020 7:57 PM  Staffing Anesthesiologist: Mal Amabile, MD Performed: anesthesiologist   Preanesthetic Checklist Completed: patient identified, IV checked, site marked, risks and benefits discussed, surgical consent, monitors and equipment checked, pre-op evaluation and timeout performed  Epidural Patient position: sitting Prep: DuraPrep and site prepped and draped Patient monitoring: continuous pulse ox and blood pressure Approach: midline Location: L3-L4 Injection technique: LOR air  Needle:  Needle type: Tuohy  Needle gauge: 17 G Needle length: 9 cm and 9 Needle insertion depth: 7 cm Catheter type: closed end flexible Catheter size: 19 Gauge Catheter at skin depth: 12 cm Test dose: negative and Other  Assessment Events: blood not aspirated, injection not painful, no injection resistance, no paresthesia and negative IV test  Additional Notes Patient identified. Risks and benefits discussed including failed block, incomplete  Pain control, post dural puncture headache, nerve damage, paralysis, blood pressure Changes, nausea, vomiting, reactions to medications-both toxic and allergic and post Partum back pain. All questions were answered. Patient expressed understanding and wished to proceed. Sterile technique was used throughout procedure. Epidural site was Dressed with sterile barrier dressing. No paresthesias, signs of intravascular injection Or signs of intrathecal spread were encountered. Attempt x 2 Patient was more comfortable after the epidural was dosed. Please see RN's note for documentation of vital signs and FHR which are stable. Reason for block:procedure for pain

## 2020-07-13 NOTE — Progress Notes (Signed)
Pamela Doyle is a 24 y.o. G1P0 at [redacted]w[redacted]d by LMP admitted for PROM  Subjective: Pt up to bathroom, requested to be checked - prefers to maintain continuity of exams. Has been changing positions, repositioned into throne. Feeling contractions more intensely, but still managing pain well. FOB at bedside.  Objective: BP 131/78   Pulse 67   Temp 98.4 F (36.9 C) (Oral)   Resp 18   LMP 10/05/2019 (Exact Date)   SpO2 99%  No intake/output data recorded. No intake/output data recorded.  FHT:  FHR: 130 bpm, variability: moderate,  accelerations:  Present,  decelerations:  Absent UC:   irregular, every 6-8 minutes SVE:   Dilation: 4 Effacement (%): 90 Station: -3 Exam by:: Henderson Newcomer, RN  Labs: Lab Results  Component Value Date   WBC 10.5 07/13/2020   HGB 11.1 (L) 07/13/2020   HCT 33.9 (L) 07/13/2020   MCV 90.6 07/13/2020   PLT 133 (L) 07/13/2020    Assessment / Plan: Latent labor with Pitocin titration  Labor: Latent labor, continue to titrate Pitocin for adequate contractions, may consider IUPC at next exam Preeclampsia:  no signs or symptoms of toxicity Fetal Wellbeing:  Category I Pain Control:  Labor support without medications I/D:  n/a Anticipated MOD:  NSVD  Edd Arbour, CNM, MSN, [facility] 07/13/20 3:31 PM

## 2020-07-13 NOTE — Anesthesia Preprocedure Evaluation (Signed)
Anesthesia Evaluation  Patient identified by MRN, date of birth, ID band Patient awake    Reviewed: Allergy & Precautions, Patient's Chart, lab work & pertinent test results  Airway Mallampati: II  TM Distance: >3 FB Neck ROM: Full    Dental no notable dental hx. (+) Teeth Intact   Pulmonary neg pulmonary ROS,    Pulmonary exam normal breath sounds clear to auscultation       Cardiovascular hypertension, Normal cardiovascular exam Rhythm:Regular Rate:Normal     Neuro/Psych negative neurological ROS  negative psych ROS   GI/Hepatic Neg liver ROS, GERD  ,  Endo/Other  Obesity  Renal/GU Renal diseaseHx/o malrotated kidneys  negative genitourinary   Musculoskeletal negative musculoskeletal ROS (+)   Abdominal (+) + obese,   Peds  Hematology  (+) anemia ,   Anesthesia Other Findings   Reproductive/Obstetrics (+) Pregnancy                             Anesthesia Physical Anesthesia Plan  ASA: II  Anesthesia Plan: Epidural   Post-op Pain Management:    Induction:   PONV Risk Score and Plan:   Airway Management Planned: Natural Airway  Additional Equipment:   Intra-op Plan:   Post-operative Plan:   Informed Consent: I have reviewed the patients History and Physical, chart, labs and discussed the procedure including the risks, benefits and alternatives for the proposed anesthesia with the patient or authorized representative who has indicated his/her understanding and acceptance.       Plan Discussed with: Anesthesiologist  Anesthesia Plan Comments:         Anesthesia Quick Evaluation

## 2020-07-13 NOTE — Progress Notes (Signed)
Patient ID: Pamela Doyle, female   DOB: 1996-09-18, 24 y.o.   MRN: [id number]  Feeling some ctx as stronger, but they are still pretty spaced out  BP 133/67, P 80 FHR 120-125, +accels, no decels, Cat 1 Ctx irreg 5-8 mins Cx deferred  IUP@40 .2wks PROM x 11+hrs Cx favorable GBS neg  Discussed options with pt and spouse, including a) continuing w/ expectant management b) using breast pump/hot shower to stimulate natural ctx c) starting Pitocin and potentially trying to stop it once in active labor to facilitate her being able to get in the tub.  Pt has decided she would like to eat breakfast and then start Pitocin.  Arabella Merles [facility] 07/13/2020 7:29 AM

## 2020-07-13 NOTE — Progress Notes (Signed)
Pamela Doyle is a 24 y.o. G1P0 at [redacted]w[redacted]d by LMP admitted for PROM  Subjective: Patient and FOB asleep, patient in left lateral with peanut ball.   Objective: BP 123/62   Pulse 64   Temp 98.1 F (36.7 C) (Oral)   Resp 20   LMP 10/05/2019 (Exact Date)   SpO2 99%  No intake/output data recorded. No intake/output data recorded.  FHT:  FHR: 125 bpm, variability: moderate,  accelerations:  Present,  decelerations:  Absent UC:   irregular, every 10-12 minutes SVE:   Dilation: 4 Effacement (%): 60 Station: -3 Exam by:: Henderson Newcomer, RN  Adjusted position ("flying cowgirl") to maximize fetal descent. Will begin pelvic tilts in an hour.  Labs: Lab Results  Component Value Date   WBC 10.5 07/13/2020   HGB 11.1 (L) 07/13/2020   HCT 33.9 (L) 07/13/2020   MCV 90.6 07/13/2020   PLT 133 (L) 07/13/2020    Assessment / Plan: SROM in latent labor with Pitocin titration and position changes  Labor: Latent Preeclampsia:  no signs or symptoms of toxicity Fetal Wellbeing:  Category I Pain Control:  Labor support without medications I/D:  n/a Anticipated MOD:  NSVD  Bernerd Limbo 07/13/2020, 9:56 AM

## 2020-07-13 NOTE — Progress Notes (Signed)
Pamela Doyle is a 24 y.o. G1P0 at [redacted]w[redacted]d by LMP admitted for PROM  Subjective: Pt and partner with questions regarding the delivery process; discussed with pt at bedside. No concerns at this time.  Objective: BP 134/73   Pulse 65   Temp 97.7 F (36.5 C) (Axillary)   Resp 17   LMP 10/05/2019 (Exact Date)   SpO2 99%  No intake/output data recorded. No intake/output data recorded.  FHT:  FHR: 125 bpm, variability: moderate,  accelerations:  Present,  decelerations:  Absent UC:  Contractions every 2 minutes SVE:   Dilation: 8 Effacement (%): 100 Station: Plus 1 Exam by:: Jonelle Sidle, RN  Labs: Lab Results  Component Value Date   WBC 17.6 (H) 07/13/2020   HGB 12.3 07/13/2020   HCT 37.3 07/13/2020   MCV 90.3 07/13/2020   PLT 122 (L) 07/13/2020    Assessment / Plan: Pt progressing well, now in active labor.  Labor: Pt now in active labor with up-titration of pitocin and good fetal tolerance. Will plan to recheck cervical exam in 2 hours or sooner as clinically indicated. Preeclampsia:  no signs or symptoms of toxicity; blood pressures normal to mild range. Fetal Wellbeing:  Category I strip Pain Control:  Epidural in place I/D:  GBS negative Anticipated MOD:  NSVD  Renn Stille, Skipper Cliche, MD OB Fellow, [facility] 07/13/2020 9:44 PM

## 2020-07-14 ENCOUNTER — Inpatient Hospital Stay (HOSPITAL_COMMUNITY): Payer: Medicaid Other

## 2020-07-14 ENCOUNTER — Encounter (HOSPITAL_COMMUNITY): Payer: Self-pay | Admitting: Obstetrics & Gynecology

## 2020-07-14 DIAGNOSIS — Z8759 Personal history of other complications of pregnancy, childbirth and the puerperium: Secondary | ICD-10-CM | POA: Diagnosis present

## 2020-07-14 DIAGNOSIS — Z3A40 40 weeks gestation of pregnancy: Secondary | ICD-10-CM

## 2020-07-14 MED ORDER — SIMETHICONE 80 MG PO CHEW: 80.0000 mg | CHEWABLE_TABLET | ORAL | Status: DC | PRN

## 2020-07-14 MED ORDER — ACETAMINOPHEN 325 MG PO TABS
650.0000 mg | ORAL_TABLET | Freq: Four times a day (QID) | ORAL | Status: DC
  Administered 2020-07-14 – 2020-07-15 (×6): 650 mg via ORAL
  Filled 2020-07-14 (×6): qty 2

## 2020-07-14 MED ORDER — ONDANSETRON HCL 4 MG/2ML IJ SOLN: 4.0000 mg | INTRAMUSCULAR | Status: DC | PRN

## 2020-07-14 MED ORDER — BENZOCAINE-MENTHOL 20-0.5 % EX AERO: 1.0000 "application " | INHALATION_SPRAY | CUTANEOUS | Status: DC | PRN

## 2020-07-14 MED ORDER — COCONUT OIL OIL: 1.0000 "application " | TOPICAL_OIL | Status: DC | PRN

## 2020-07-14 MED ORDER — DIPHENHYDRAMINE HCL 25 MG PO CAPS: 25.0000 mg | ORAL_CAPSULE | Freq: Four times a day (QID) | ORAL | Status: DC | PRN

## 2020-07-14 MED ORDER — SENNOSIDES-DOCUSATE SODIUM 8.6-50 MG PO TABS
2.0000 | ORAL_TABLET | ORAL | Status: DC
  Administered 2020-07-14: 2 via ORAL
  Filled 2020-07-14: qty 2

## 2020-07-14 MED ORDER — ZOLPIDEM TARTRATE 5 MG PO TABS: 5.0000 mg | ORAL_TABLET | Freq: Every evening | ORAL | Status: DC | PRN

## 2020-07-14 MED ORDER — WITCH HAZEL-GLYCERIN EX PADS: 1.0000 "application " | MEDICATED_PAD | CUTANEOUS | Status: DC | PRN

## 2020-07-14 MED ORDER — TETANUS-DIPHTH-ACELL PERTUSSIS 5-2.5-18.5 LF-MCG/0.5 IM SUSP: 0.5000 mL | Freq: Once | INTRAMUSCULAR | Status: DC

## 2020-07-14 MED ORDER — DIBUCAINE (PERIANAL) 1 % EX OINT: 1.0000 "application " | TOPICAL_OINTMENT | CUTANEOUS | Status: DC | PRN

## 2020-07-14 MED ORDER — IBUPROFEN 600 MG PO TABS
600.0000 mg | ORAL_TABLET | Freq: Three times a day (TID) | ORAL | Status: DC
  Administered 2020-07-14 – 2020-07-15 (×4): 600 mg via ORAL
  Filled 2020-07-14 (×4): qty 1

## 2020-07-14 MED ORDER — PRENATAL MULTIVITAMIN CH
1.0000 | ORAL_TABLET | Freq: Every day | ORAL | Status: DC
  Administered 2020-07-14 – 2020-07-15 (×2): 1 via ORAL
  Filled 2020-07-14 (×2): qty 1

## 2020-07-14 MED ORDER — ONDANSETRON HCL 4 MG PO TABS: 4.0000 mg | ORAL_TABLET | ORAL | Status: DC | PRN

## 2020-07-14 NOTE — Discharge Summary (Signed)
Postpartum Discharge Summary     Patient Name: Pamela Doyle DOB: 10/18/1996 MRN: [id number]  Date of admission: 07/12/2020 Delivery date:07/14/2020  Delivering provider: Randa Ngo  Date of discharge: 07/15/2020  Admitting diagnosis: Post term pregnancy [O48.0] Intrauterine pregnancy: [redacted]w[redacted]d     Secondary diagnosis:  Active Problems:   Supervision of normal first pregnancy, antepartum   Biological false positive RPR test   Abnormal genetic test during pregnancy   Cystic fibrosis carrier   Obesity in pregnancy   Gestational thrombocytopenia ([place])   Post term pregnancy   Vaginal delivery   Gestational hypertension  Additional problems: as noted above  Discharge diagnosis: Vaginal Delivery                                         Post partum procedures:none Augmentation: Pitocin Complications: None  Hospital course: Onset of Labor With Vaginal Delivery      24 y.o. G1P0 at [redacted]w[redacted]d was admitted in Latent Labor s/p PROM at home on 07/12/2020. Patient had an uncomplicated labor course as follows:  Membrane Rupture Time/Date: 2000 on 8/25 Delivery Method:Vaginal, Spontaneous  Episiotomy: None  Lacerations:  None  Patient had an uncomplicated postpartum course.  She is ambulating, tolerating a regular diet, passing flatus, and urinating well. Patient is discharged home in stable condition on 07/15/20.  Newborn Data: Birth date:07/14/2020  Birth time:12:45 AM  Gender:Female  Living status:Living  Apgars:8 ,9  Weight:3785 g   Magnesium Sulfate received: No BMZ received: No Rhophylac:N/A MMR:N/A T-DaP:Given prenatally Flu: No Transfusion:No  Physical exam  Vitals:   07/14/20 1237 07/14/20 1651 07/14/20 2030 07/15/20 0520  BP: 118/69 119/86 122/78 118/72  Pulse: 67 82 88 80  Resp: $Remo'14 16 17 18  'YuTFs$ Temp: 98.4 F (36.9 C) 98.6 F (37 C) 98 F (36.7 C) 97.8 F (36.6 C)  TempSrc: Oral Oral Oral Oral  SpO2: 98% 99% 99% 98%   General: alert, cooperative and no  distress Lochia: appropriate Uterine Fundus: firm Incision: N/A DVT Evaluation: No evidence of DVT seen on physical exam. Labs: Lab Results  Component Value Date   WBC 17.6 (H) 07/13/2020   HGB 12.3 07/13/2020   HCT 37.3 07/13/2020   MCV 90.3 07/13/2020   PLT 122 (L) 07/13/2020   CMP Latest Ref Rng & Units 07/13/2020  Glucose 70 - 99 mg/dL 90  BUN 6 - 20 mg/dL 7  Creatinine 0.44 - 1.00 mg/dL 0.60  Sodium 135 - 145 mmol/L 137  Potassium 3.5 - 5.1 mmol/L 3.8  Chloride 98 - 111 mmol/L 109  CO2 22 - 32 mmol/L 17(L)  Calcium 8.9 - 10.3 mg/dL 8.7(L)  Total Protein 6.5 - 8.1 g/dL 6.4(L)  Total Bilirubin 0.3 - 1.2 mg/dL 0.2(L)  Alkaline Phos 38 - 126 U/L 198(H)  AST 15 - 41 U/L 13(L)  ALT 0 - 44 U/L 10   Edinburgh Score: Edinburgh Postnatal Depression Scale Screening Tool 07/14/2020  I have been able to laugh and see the funny side of things. 0  I have looked forward with enjoyment to things. 0  I have blamed myself unnecessarily when things went wrong. 0  I have been anxious or worried for no good reason. 0  I have felt scared or panicky for no good reason. 0  Things have been getting on top of me. 0  I have been so unhappy that I have had  difficulty sleeping. 0  I have felt sad or miserable. 0  I have been so unhappy that I have been crying. 0  The thought of harming myself has occurred to me. 0  Edinburgh Postnatal Depression Scale Total 0     After visit meds:  Allergies as of 07/15/2020   No Known Allergies     Medication List    TAKE these medications   acetaminophen 325 MG tablet Commonly known as: Tylenol Take 2 tablets (650 mg total) by mouth every 6 (six) hours.   coconut oil Oil Apply 1 application topically as needed.   ibuprofen 600 MG tablet Commonly known as: ADVIL Take 1 tablet (600 mg total) by mouth every 8 (eight) hours.   prenatal multivitamin Tabs tablet Take 1 tablet by mouth daily at 12 noon.        Discharge home in stable  condition Infant Feeding: Breast Infant Disposition:home with mother Discharge instruction: per After Visit Summary and Postpartum booklet. Activity: Advance as tolerated. Pelvic rest for 6 weeks.  Diet: routine diet Future Appointments: Future Appointments  Date Time Provider [place]  07/21/2020 10:00 AM [facility] NURSE [facility]  08/10/2020 10:00 AM Donnamae Jude, MD [facility]   Follow up Visit:   Please schedule this patient for a In person postpartum visit in 4 weeks with the following provider: Any provider. Additional Postpartum F/U:BP check 1 week  Low risk pregnancy complicated by: gHTN (diagnosed on admission to L&D) Delivery mode:  Vaginal, Spontaneous  Anticipated Birth Control:  declines  06/06/9197 Arrie Senate, MD

## 2020-07-14 NOTE — Discharge Instructions (Signed)

## 2020-07-14 NOTE — Lactation Note (Addendum)
This note was copied from a baby's chart. Lactation Consultation Note  Patient Name: Pamela Doyle [id number] Date: 07/14/2020 Reason for consult: Initial assessment;1st time breastfeeding;Mother's request;Difficult latch;Primapara;Term   Mom states infant had some trouble sustaining latch, since the infant would nurse for a few minutes. RN assisted Mom with the use of the manual pump and they gave the infant 17ml with curve tip syringe and finger feeding. Mom states she was also given parts for the DEBP but did not start it at this time. Mom has a Lansinoh and Haka pump at home. Mom states she discussed using formula but also did not start it at the time of the [facility] visit.  Infant's last feeding 11 am with 5 ml of EBM. Infant had been asleep as per Mom since then.  Infant started cueing. LC assisted Mom by changing his diaper but no urine or stool noted. He had just a streak of fecal material but no quantity of stool noted. Since birth he has had 1 stool and no urine.   Since infant was awake, LC assisted Mom placing him in football on the L breast for 12 minutes. Infant showed signs of milk transfer demonstrated to both parents.  Mom attempted to offer him the other breast but infant did not want to latch, although alert and awake.   Plan reviewed with parents: 1. Feed based on cues 8-12x in 24 hour period and not to go more than 4 hours without nursing. 2. Mom has manual pump that she can use to increase stimulation.  3. Mom has a pacifier in the bed, LC went over the risks of using the pacifier before 4 weeks.  4. Mom given snappies to collect EBM after pumping.  5. LC brochure given to Mom on outpatient services.    LATCH Score: 8  Interventions Interventions: Breast feeding basics reviewed;Breast compression;Assisted with latch;Adjust position;Skin to skin;Support pillows;Breast massage;Hand pump  Lactation Tools Discussed/Used Initiated by:: RN Date initiated:: 07/14/20   Consult  Status Consult Status: Follow-up Follow-up type: In-patient    Pamela Haralson  Doyle 07/14/2020, 4:44 PM

## 2020-07-14 NOTE — Anesthesia Postprocedure Evaluation (Signed)
Anesthesia Post Note  Patient: Pamela Doyle  Procedure(s) Performed: AN AD HOC LABOR EPIDURAL     Patient location during evaluation: Mother Baby Anesthesia Type: Epidural Level of consciousness: awake and alert and oriented Pain management: satisfactory to patient Vital Signs Assessment: post-procedure vital signs reviewed and stable Respiratory status: respiratory function stable Cardiovascular status: stable Postop Assessment: no headache, no backache, epidural receding, patient able to bend at knees, no signs of nausea or vomiting, adequate PO intake and able to ambulate Anesthetic complications: no   No complications documented.  Last Vitals:  Vitals:   07/14/20 0252 07/14/20 0348  BP: 134/80 133/72  Pulse: 75 82  Resp: 18 18  Temp: 37.2 C 37.1 C  SpO2: 99% 100%    Last Pain:  Vitals:   07/14/20 0348  TempSrc: Oral  PainSc: 0-No pain   Pain Goal:                   Carrianne Hyun

## 2020-07-15 LAB — RPR: RPR Ser Ql: NONREACTIVE

## 2020-07-15 MED ORDER — COCONUT OIL OIL: 1.0000 "application " | TOPICAL_OIL | 0 refills | Status: DC | PRN

## 2020-07-15 MED ORDER — IBUPROFEN 600 MG PO TABS
600.0000 mg | ORAL_TABLET | Freq: Three times a day (TID) | ORAL | 0 refills | Status: DC
Start: 2020-07-15 — End: 2020-12-06

## 2020-07-15 MED ORDER — PRENATAL MULTIVITAMIN CH: 1.0000 | ORAL_TABLET | Freq: Every day | ORAL | Status: DC

## 2020-07-15 MED ORDER — ACETAMINOPHEN 325 MG PO TABS
650.0000 mg | ORAL_TABLET | Freq: Four times a day (QID) | ORAL | 0 refills | Status: DC
Start: 2020-07-15 — End: 2021-10-31

## 2020-07-15 MED ORDER — CYCLOBENZAPRINE HCL 10 MG PO TABS
5.0000 mg | ORAL_TABLET | Freq: Once | ORAL | Status: AC
  Administered 2020-07-15: 5 mg via ORAL
  Filled 2020-07-15: qty 1

## 2020-07-15 NOTE — Lactation Note (Signed)
This note was copied from a baby's chart. Lactation Consultation Note  Patient Name: Pamela Ketina Mars [id number] Date: 07/15/2020 Reason for consult: Follow-up assessment;Term  Infant is 15 hours old 40 weeks with a 2% weight loss. Mom states infant was cluster feeding at night for 20-30 minutes, so she was unable to pump using the manual. Mom states she felt infant needed more and started formula, Similac w/ Iron, for the last 6 feedings 15 ml or more per feed.  Mom stated today she was able to EBM and gave infant via a spoon feeding as well as nursing at the breast 13:30 for 20 minutes.   Mom states her plan is to bottle feed EBM from pumping and/or formula. She has DEBP at home. Once her milk supply increases, she will offer BM either nursing or via paced bottle feeding and d/c forumula. She has Dr. Theora Gianotti nipples for breastfeeding babies and has experience with paced bottle feeding.   Infant had multiple fecal and urine output, 4 and 4 respectively.   Plan 1. Reviewed with Mom nursing based on cues 8-12 in 24 hour period and not to go over 4 hours without a feed. Importance to offer infant breast or breast milk first and supplement as needed.         2. Mom plans use both BM and formula, and will pump after feedings to increase her milk supply.         3. LC went over with parents Breastfeeding supplementation guidelines.          4. Reviewed with Mom signs, symptoms, treatment for Engorgement.          5. Mom will f/u with [facility] upon discharge          6. [facility] outpatient services reviewed with parents and brochure given.

## 2020-07-20 ENCOUNTER — Ambulatory Visit (INDEPENDENT_AMBULATORY_CARE_PROVIDER_SITE_OTHER): Payer: Medicaid Other | Admitting: *Deleted

## 2020-07-20 ENCOUNTER — Telehealth: Payer: Self-pay | Admitting: *Deleted

## 2020-07-20 ENCOUNTER — Other Ambulatory Visit: Payer: Self-pay

## 2020-07-20 VITALS — BP 127/85 | HR 69

## 2020-07-20 DIAGNOSIS — O139 Gestational [pregnancy-induced] hypertension without significant proteinuria, unspecified trimester: Secondary | ICD-10-CM

## 2020-07-20 NOTE — Progress Notes (Signed)
Patient was assessed and managed by nursing staff during this encounter. I have reviewed the chart and agree with the documentation and plan. I have also made any necessary editorial changes.  Jaynie Collins, MD 07/20/2020 9:48 AM

## 2020-07-20 NOTE — Telephone Encounter (Signed)
Pt called today concerned about pain she is having in her right arm near her bicep. She states she having numbness and tingling down her arm into her fingers. Pt wasn't sure if was due to holding her legs at delivery or due to pumping. Pt is coming in today for a BP check. Informed pt that I will discuss with provider and discuss with patient  when she's come in.

## 2020-07-20 NOTE — Progress Notes (Signed)
Subjective:  Pamela Doyle is a 24 y.o. female here for BP check.   Hypertension ROS: no chest pain on exertion, no dyspnea on exertion and no swelling of ankles.    Objective:  BP 127/85   Pulse 69   Appearance alert, well appearing, and in no distress. General exam BP noted to be well controlled today in office.    Assessment:   Blood Pressure well controlled.   Plan:  Follow up as needed and or at postpartum visit. Also discussed with patient about her arm pain. Most likely due to delivery and it should get better. She can continue to take tylenol and ibuprofen as needed, try ice or heat.  Marland Kitchen

## 2020-07-21 ENCOUNTER — Ambulatory Visit: Payer: Medicaid Other

## 2020-08-10 ENCOUNTER — Other Ambulatory Visit: Payer: Self-pay

## 2020-08-10 ENCOUNTER — Encounter: Payer: Self-pay | Admitting: Family Medicine

## 2020-08-10 ENCOUNTER — Ambulatory Visit (INDEPENDENT_AMBULATORY_CARE_PROVIDER_SITE_OTHER): Payer: Medicaid Other | Admitting: Family Medicine

## 2020-08-10 NOTE — Progress Notes (Signed)
Pamela Doyle Partum Visit Note  Pamela Doyle is a 24 y.o. G20P1001 female who presents for a postpartum visit. She is 4 weeks postpartum following a normal spontaneous vaginal delivery.  I have fully reviewed the prenatal and intrapartum course. The delivery was at [redacted]w[redacted]d gestational weeks.  Anesthesia: epidural. Postpartum course has been well. Baby is doing well. Baby is feeding by breast. Bleeding no bleeding. Bowel function is normal. Bladder function is normal. Patient is sexually active. Contraception method is none. Postpartum depression screening: positive.   The pregnancy intention screening data noted above was reviewed. Potential methods of contraception were discussed. The patient elected to proceed with Withdrawal or Other Method.    Edinburgh Postnatal Depression Scale - 08/10/20 1022      Edinburgh Postnatal Depression Scale:  In the Past 7 Days   I have been able to laugh and see the funny side of things. 0    I have looked forward with enjoyment to things. 0    I have blamed myself unnecessarily when things went wrong. 0    I have been anxious or worried for no good reason. 0    I have felt scared or panicky for no good reason. 0    Things have been getting on top of me. 0    I have been so unhappy that I have had difficulty sleeping. 0    I have felt sad or miserable. 0    I have been so unhappy that I have been crying. 0    The thought of harming myself has occurred to me. 0    Edinburgh Postnatal Depression Scale Total 0            The following portions of the patient's history were reviewed and updated as appropriate: allergies, current medications, past family history, past medical history, past social history, past surgical history and problem list.  Review of Systems Pertinent items noted in HPI and remainder of comprehensive ROS otherwise negative.    Objective:  Blood pressure 136/87, pulse (!) 112, weight 190 lb (86.2 kg), currently breastfeeding.  General:   alert, cooperative and appears stated age  Lungs: normal effort  Heart:  regular rate and rhythm  Abdomen: soft, non-tender; bowel sounds normal; no masses,  no organomegaly        Assessment:    Normal postpartum exam. Pap smear not done at today's visit.   Plan:   Essential components of care per ACOG recommendations:  1.  Mood and well being: Patient with negative depression screening today. Reviewed local resources for support.  - Patient does not use tobacco.  2. Infant care and feeding:  -Patient currently breastmilk feeding? Yes not planning on returning to work outside the home  3. Sexuality, contraception and birth spacing - Patient does want a pregnancy in the next year.  Desired family size is 2 children.  - Reviewed forms of contraception in tiered fashion. Patient desired no method today.   - Discussed birth spacing of 18 months  4. Sleep and fatigue -Encouraged family/partner/community support of 4 hrs of uninterrupted sleep to help with mood and fatigue  5. Physical Recovery  - Discussed patients delivery and complications - Patient had no lacerations. Patient expressed understanding - Patient has urinary incontinence? No - Patient is safe to resume physical and sexual activity  6.  Health Maintenance - Last pap smear done 12/2019 and was normal.   Reva Bores, MD [facility] for [facility], [facility]

## 2020-09-27 ENCOUNTER — Emergency Department (INDEPENDENT_AMBULATORY_CARE_PROVIDER_SITE_OTHER)
Admission: EM | Admit: 2020-09-27 | Discharge: 2020-09-27 | Disposition: A | Discharge status: Home / Self Care | Payer: Medicaid Other | Source: Home / Self Care | Attending: Family Medicine | Admitting: Family Medicine

## 2020-09-27 ENCOUNTER — Other Ambulatory Visit: Payer: Self-pay

## 2020-09-27 DIAGNOSIS — J029 Acute pharyngitis, unspecified: Secondary | ICD-10-CM | POA: Diagnosis not present

## 2020-09-27 LAB — POCT RAPID STREP A (OFFICE): Rapid Strep A Screen: NEGATIVE

## 2020-09-27 MED ORDER — PENICILLIN V POTASSIUM 250 MG/5ML PO SOLR: ORAL | 0 refills | Status: DC

## 2020-09-27 NOTE — ED Triage Notes (Addendum)
Pt presents to Urgent Care with c/o sore throat x 3 days. Afebrile. States she thinks she has strep throat d/t having hx of frequent strep infections. Also reports pressing 3 salivary stones from neck/throat several days ago. Pt w/ no known COVID exposure; pt hasn't been vaccinated.

## 2020-09-27 NOTE — Discharge Instructions (Addendum)
Try warm salt water gargles for sore throat.  May take ibuprofen as needed for sore throat.

## 2020-09-27 NOTE — ED Provider Notes (Signed)
[facility]    CSN: [id number] Arrival date & time: 09/27/20  1005      History   Chief Complaint Chief Complaint  Patient presents with  . Sore Throat    HPI Pamela Doyle is a 24 y.o. female.   Patient developed a sore throat that has persisted.  She removed three tonsil stones that she had not seen before.  She denies fevers, chills, and sweats.  The history is provided by the patient.    Past Medical History:  Diagnosis Date  . Chronic kidney disease    mal rotated kidneys    Patient Active Problem List   Diagnosis Date Noted  . History of gestational hypertension 07/14/2020  . Gestational thrombocytopenia ([facility]) 04/15/2020  . 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 Biological false positive RPR test 12/23/2019    Past Surgical History:  Procedure Laterality Date  . NO PAST SURGERIES      OB History    Gravida  1   Para  1   Term  1   Preterm      AB      Living  1     SAB      TAB      Ectopic      Multiple  0   Live Births  1            Home Medications    Prior to Admission medications   Medication Sig Start Date End Date Taking? Authorizing Provider  Biotin 2.5 MG CAPS Take 1 capsule by mouth daily.   Yes [provider]  Multiple Vitamin (MULTIVITAMIN WITH MINERALS) TABS tablet Take 1 tablet by mouth daily.   Yes [provider]  acetaminophen (TYLENOL) 325 MG tablet Take 2 tablets (650 mg total) by mouth every 6 (six) hours. 07/15/20   Sabino Dick, DO  coconut oil OIL Apply 1 application topically as needed. 07/15/20   Sabino Dick, DO  ibuprofen (ADVIL) 600 MG tablet Take 1 tablet (600 mg total) by mouth every 8 (eight) hours. 07/15/20   Sabino Dick, DO  penicillin v potassium (VEETID) 250 MG/5ML solution Take 20mL by mouth twice daily for 10 days 09/27/20 10/11/20  Lattie Haw, MD  Prenatal Vit-Fe Fumarate-FA (PRENATAL MULTIVITAMIN) TABS tablet Take 1 tablet by mouth daily  at 12 noon. 07/15/20   Sabino Dick, DO    Family History Family History  Adopted: Yes  Problem Relation Age of Onset  . Healthy Mother   . Drug abuse Father   . Breast cancer Maternal Grandmother     Social History Social History   Tobacco Use  . Smoking status: Never Smoker  . Smokeless tobacco: Never Used  Vaping Use  . Vaping Use: Never used  Substance Use Topics  . Alcohol use: Not Currently    Comment: Socially  . Drug use: Not Currently     Allergies   Patient has no known allergies.   Review of Systems Review of Systems + sore throat No cough No pleuritic pain No wheezing No nasal congestion No post-nasal drainage No sinus pain/pressure No itchy/red eyes ? earache No hemoptysis No SOB No fever/chills No nausea No vomiting No abdominal pain No diarrhea No urinary symptoms No skin rash + fatigue No myalgias No headache   Physical Exam Triage Vital Signs ED Triage Vitals  Enc Vitals Group     BP 09/27/20 1025 116/80     Pulse Rate 09/27/20 1025 78  Resp 09/27/20 1025 18     Temp 09/27/20 1025 98.3 F (36.8 C)     Temp Source 09/27/20 1025 Oral     SpO2 09/27/20 1025 98 %     Weight --      Height --      Head Circumference --      Peak Flow --      Pain Score 09/27/20 1018 8     Pain Loc --      Pain Edu? --      Excl. in GC? --    No data found.  Updated Vital Signs BP 116/80   Pulse 78   Temp 98.3 F (36.8 C) (Oral)   Resp 18   LMP 09/18/2020 (Exact Date)   SpO2 98%   Visual Acuity Right Eye Distance:   Left Eye Distance:   Bilateral Distance:    Right Eye Near:   Left Eye Near:    Bilateral Near:     Physical Exam Nursing notes and Vital Signs reviewed. Appearance:  Patient appears stated age, and in no acute distress Eyes:  Pupils are equal, round, and reactive to light and accomodation.  Extraocular movement is intact.  Conjunctivae are not inflamed  Ears:  Canals normal.  Tympanic membranes normal.    Nose:  Normal turbinates.  No sinus tenderness.  Pharynx:   Mildly erythematous. Neck:  Supple. Tender enlarged tonsillar nodes.  Lungs:  Clear to auscultation.  Breath sounds are equal.  Moving air well. Heart:  Regular rate and rhythm without murmurs, rubs, or gallops.  Abdomen:  Nontender without masses or hepatosplenomegaly.  Bowel sounds are present.  No CVA or flank tenderness.  Extremities:  No edema.  Skin:  No rash present.   UC Treatments / Results  Labs (all labs ordered are listed, but only abnormal results are displayed) Labs Reviewed  POCT RAPID STREP A (OFFICE) negative    EKG   Radiology No results found.  Procedures Procedures (including critical care time)  Medications Ordered in UC Medications - No data to display  Initial Impression / Assessment and Plan / UC Course  I have reviewed the triage vital signs and the nursing notes.  Pertinent labs & imaging results that were available during my care of the patient were reviewed by me and considered in my medical decision making (see chart for details).    ?false negative rapid strep.  Begin PenVK for 10 days. Followup with Family Doctor if not improved in 10 days.   Final Clinical Impressions(s) / UC Diagnoses   Final diagnoses:  Acute pharyngitis, unspecified etiology     Discharge Instructions     Try warm salt water gargles for sore throat.  May take ibuprofen as needed for sore throat.   ED Prescriptions    Medication Sig Dispense Auth. Provider   penicillin v potassium (VEETID) 250 MG/5ML solution Take 38mL by mouth twice daily for 10 days 200 mL Lattie Haw, MD        Lattie Haw, MD 10/01/20 1323

## 2020-10-05 ENCOUNTER — Encounter: Payer: Self-pay | Admitting: Emergency Medicine

## 2020-10-05 ENCOUNTER — Emergency Department (INDEPENDENT_AMBULATORY_CARE_PROVIDER_SITE_OTHER): Payer: Medicaid Other

## 2020-10-05 ENCOUNTER — Emergency Department (INDEPENDENT_AMBULATORY_CARE_PROVIDER_SITE_OTHER)
Admission: EM | Admit: 2020-10-05 | Discharge: 2020-10-05 | Disposition: A | Discharge status: Home / Self Care | Payer: Medicaid Other | Source: Home / Self Care

## 2020-10-05 ENCOUNTER — Other Ambulatory Visit: Payer: Self-pay

## 2020-10-05 ENCOUNTER — Other Ambulatory Visit (HOSPITAL_COMMUNITY)
Admission: RE | Admit: 2020-10-05 | Discharge: 2020-10-05 | Disposition: A | Discharge status: Home / Self Care | Payer: Medicaid Other | Source: Ambulatory Visit | Attending: Family Medicine | Admitting: Family Medicine

## 2020-10-05 DIAGNOSIS — N3001 Acute cystitis with hematuria: Secondary | ICD-10-CM

## 2020-10-05 DIAGNOSIS — N760 Acute vaginitis: Secondary | ICD-10-CM

## 2020-10-05 DIAGNOSIS — R1030 Lower abdominal pain, unspecified: Secondary | ICD-10-CM | POA: Diagnosis not present

## 2020-10-05 DIAGNOSIS — R102 Pelvic and perineal pain: Secondary | ICD-10-CM | POA: Diagnosis not present

## 2020-10-05 DIAGNOSIS — R52 Pain, unspecified: Secondary | ICD-10-CM

## 2020-10-05 LAB — POCT URINALYSIS DIP (MANUAL ENTRY)
Bilirubin, UA: NEGATIVE
Glucose, UA: NEGATIVE mg/dL
Ketones, POC UA: NEGATIVE mg/dL
Nitrite, UA: NEGATIVE
Protein Ur, POC: NEGATIVE mg/dL
Spec Grav, UA: 1.025 (ref 1.010–1.025)
Urobilinogen, UA: 0.2 E.U./dL
pH, UA: 5.5 (ref 5.0–8.0)

## 2020-10-05 MED ORDER — FLUCONAZOLE 200 MG PO TABS: 200.0000 mg | ORAL_TABLET | Freq: Once | ORAL | 0 refills | Status: AC

## 2020-10-05 NOTE — ED Provider Notes (Signed)
Pamela Doyle C[facility]    CSN: [id number] Arrival date & time: 10/05/20  1155      History   Chief Complaint Chief Complaint  Patient presents with  . Groin Swelling    HPI Pamela Doyle is a 24 y.o. female.   HPI Pamela Doyle is a 24 y.o. female presenting to UC with c/o 4 days of lower abdominal cramping and vaginal swelling and irritation.  Pt is almost 3 months post-partum after uncomplicated vaginal delivery.  She has not called her OB/GYN about current symptoms. She had her first menses last week and thought lower abdominal cramping was related to her menses. Denies vaginal discharge or bleeding today.  She did take a partial 10 day course of penicillin for pharyngitis about 2 weeks ago.  Hx of yeast infection in the past. Denies fever, chills, n/v/d. Denies new soaps or lotions. She has not tried anything for her current symptoms.   Past Medical History:  Diagnosis Date  . Chronic kidney disease    mal rotated kidneys    Patient Active Problem List   Diagnosis Date Noted  . History of gestational hypertension 07/14/2020  . Gestational thrombocytopenia (HCC) 04/15/2020  . 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 Biological false positive RPR test 12/23/2019    Past Surgical History:  Procedure Laterality Date  . NO PAST SURGERIES      OB History    Gravida  1   Para  1   Term  1   Preterm      AB      Living  1     SAB      TAB      Ectopic      Multiple  0   Live Births  1            Home Medications    Prior to Admission medications   Medication Sig Start Date End Date Taking? Authorizing Provider  acetaminophen (TYLENOL) 325 MG tablet Take 2 tablets (650 mg total) by mouth every 6 (six) hours. 07/15/20   Sabino Dick, DO  Biotin 2.5 MG CAPS Take 1 capsule by mouth daily.    [provider]  coconut oil OIL Apply 1 application topically as needed. 07/15/20   Sabino Dick, DO  fluconazole (DIFLUCAN) 200 MG tablet  Take 1 tablet (200 mg total) by mouth once for 1 dose. 10/05/20 10/05/20  Lurene Shadow, PA-C  ibuprofen (ADVIL) 600 MG tablet Take 1 tablet (600 mg total) by mouth every 8 (eight) hours. 07/15/20   Sabino Dick, DO  Multiple Vitamin (MULTIVITAMIN WITH MINERALS) TABS tablet Take 1 tablet by mouth daily.    [provider]    Family History Family History  Adopted: Yes  Problem Relation Age of Onset  . Healthy Mother   . Drug abuse Father   . Breast cancer Maternal Grandmother     Social History Social History   Tobacco Use  . Smoking status: Never Smoker  . Smokeless tobacco: Never Used  Vaping Use  . Vaping Use: Never used  Substance Use Topics  . Alcohol use: Yes    Comment: Socially  . Drug use: Not Currently     Allergies   Patient has no known allergies.   Review of Systems Review of Systems  Constitutional: Negative for chills and fever.  Gastrointestinal: Positive for abdominal pain (lower abdominal cramping). Negative for diarrhea, nausea and vomiting.  Genitourinary: Positive for pelvic pain and vaginal pain (redness, irritation  and swelling). Negative for dysuria, flank pain, urgency and vaginal bleeding.  Musculoskeletal: Negative for back pain and myalgias.     Physical Exam Triage Vital Signs ED Triage Vitals  Enc Vitals Group     BP 10/05/20 1208 130/83     Pulse Rate 10/05/20 1208 76     Resp --      Temp 10/05/20 1208 98.2 F (36.8 C)     Temp Source 10/05/20 1208 Oral     SpO2 10/05/20 1208 97 %     Weight 10/05/20 1209 215 lb (97.5 kg)     Height 10/05/20 1209 5\' 8"  (1.727 m)     Head Circumference --      Peak Flow --      Pain Score 10/05/20 1208 8     Pain Loc --      Pain Edu? --      Excl. in GC? --    No data found.  Updated Vital Signs BP 130/83 (BP Location: Right Arm)   Pulse 76   Temp 98.2 F (36.8 C) (Oral)   Ht 5\' 8"  (1.727 m)   Wt 215 lb (97.5 kg)   LMP 09/18/2020 (Exact Date)   SpO2 97%   BMI  32.69 kg/m   Visual Acuity Right Eye Distance:   Left Eye Distance:   Bilateral Distance:    Right Eye Near:   Left Eye Near:    Bilateral Near:     Physical Exam Vitals and nursing note reviewed. Exam conducted with a chaperone present.  Constitutional:      Appearance: Normal appearance. She is well-developed.  HENT:     Head: Normocephalic and atraumatic.  Cardiovascular:     Rate and Rhythm: Normal rate and regular rhythm.  Pulmonary:     Effort: Pulmonary effort is normal. No respiratory distress.     Breath sounds: Normal breath sounds.  Abdominal:     General: There is no distension.     Palpations: Abdomen is soft. There is no mass.     Tenderness: There is abdominal tenderness. There is no right CVA tenderness, left CVA tenderness, guarding or rebound.     Hernia: No hernia is present.    Genitourinary:    Labia:        Right: Tenderness present. No lesion.        Left: Tenderness present. No lesion.      Vagina: Vaginal discharge (small amount clear-white mucous consistency ) present.     Cervix: Normal.     Uterus: Normal.      Adnexa: Right adnexa normal and left adnexa normal.     Comments: Both labia majora- erythematous, mild to moderate swelling, tender. No induration, masses or fluctuance. Skin in tact.  Musculoskeletal:        General: Normal range of motion.     Cervical back: Normal range of motion.  Skin:    General: Skin is warm and dry.  Neurological:     Mental Status: She is alert and oriented to person, place, and time.  Psychiatric:        Behavior: Behavior normal.      UC Treatments / Results  Labs (all labs ordered are listed, but only abnormal results are displayed) Labs Reviewed  POCT URINALYSIS DIP (MANUAL ENTRY) - Abnormal; Notable for the following components:      Result Value   Clarity, UA cloudy (*)    Blood, UA trace-intact (*)    Leukocytes, UA  Small (1+) (*)    All other components within normal limits  URINE  CULTURE  CERVICOVAGINAL ANCILLARY ONLY    EKG   Radiology US PELVIC COMPLETE W TRANSVAGINAL AND TORSION R/O  Result Date: 10/05/2020 CLINICAL DATA:  Pelvic pain for 1 week. Patient is 2 months postpartum EXAM: TRANSABDOMINAL AND TRANSVAGINAL ULTRASOUND OF PELVIS DOPPLER ULTRASOUND OF OVARIES TECHNIQUE: Both transabdominal and transvaginal ultrasound examinations of the pelvis were performed. Transabdominal technique was performed for global imaging of the pelvis including uterus, ovaries, adnexal regions, and pelvic cul-de-sac. It was necessary to proceed with endovaginal exam following the transabdominal exam to visualize the endometrium and ovaries. Color and duplex Doppler ultrasound was utilized to evaluate blood flow to the ovaries. COMPARISON:  None. FINDINGS: Uterus Measurements: 9.4 x 5.2 x 6.6 cm = volume: 169 mL. Retroverted. No fibroids or other mass visualized. Endometrium Thickness: 16 mm.  No focal abnormality visualized. Right ovary Measurements: 3.7 x 1.9 x 3.6 cm = volume: 13 mL. Normal appearance/no adnexal mass. Left ovary Measurements: 4.2 x 2.2 x 2.3 cm = volume: 11 mL. Normal appearance/no adnexal mass. Pulsed Doppler evaluation of both ovaries demonstrates normal low-resistance arterial and venous waveforms. Other findings Trace free fluid within the cul-de-sac. IMPRESSION: 1. Negative for adnexal torsion. 2. Endometrial thickness of 16 mm, which is upper limits of normal for a premenopausal female and may reflect secretory phase of menstrual cycle. 3. Trace free fluid within the cul-de-sac, likely physiologic. Electronically Signed   By: Duanne Guess D.O.   On: 10/05/2020 13:28    Procedures Procedures (including critical care time)  Medications Ordered in UC Medications - No data to display  Initial Impression / Assessment and Plan / UC Course  I have reviewed the triage vital signs and the nursing notes.  Pertinent labs & imaging results that were available during  my care of the patient were reviewed by me and considered in my medical decision making (see chart for details).    UA no definite UTI Urine culture: sent Vaginal swab: pending Discussed imaging with pt Due to recent PCN use and hx of yeast infections, will tx empirically with one dose of diflucan Strongly encouraged f/u with OB/GYN if not improving Discussed symptoms that warrant emergent care in the ED. AVS given  Final Clinical Impressions(s) / UC Diagnoses   Final diagnoses:  Lower abdominal pain  Pelvic pain in female  Acute cystitis with hematuria  Pain  Vaginitis and vulvovaginitis     Discharge Instructions      Call to schedule a follow up appointment with your OB/GYN tomorrow or early next week for recheck of symptoms. You will be notified of your test results in 2-3 days if anything comes back positive.    Call 911 or have someone drive you to the hospital if symptoms significantly worsening including worsening pain, difficulty urinating, fever, vomiting, or other new concerning symptoms develop.      ED Prescriptions    Medication Sig Dispense Auth. Provider   fluconazole (DIFLUCAN) 200 MG tablet Take 1 tablet (200 mg total) by mouth once for 1 dose. 1 tablet Lurene Shadow, PA-C     PDMP not reviewed this encounter.   Lurene Shadow, [place] 10/05/20 1405

## 2020-10-05 NOTE — Discharge Instructions (Signed)
  Call to schedule a follow up appointment with your OB/GYN tomorrow or early next week for recheck of symptoms. You will be notified of your test results in 2-3 days if anything comes back positive.    Call 911 or have someone drive you to the hospital if symptoms significantly worsening including worsening pain, difficulty urinating, fever, vomiting, or other new concerning symptoms develop.

## 2020-10-05 NOTE — ED Triage Notes (Signed)
Vaginal swelling, lower abdominal pain x 4 days.

## 2020-10-06 LAB — CERVICOVAGINAL ANCILLARY ONLY
Bacterial Vaginitis (gardnerella): POSITIVE — AB
Candida Glabrata: NEGATIVE
Candida Vaginitis: POSITIVE — AB
Chlamydia: NEGATIVE
Comment: NEGATIVE
Comment: NEGATIVE
Comment: NEGATIVE
Comment: NEGATIVE
Comment: NEGATIVE
Comment: NORMAL
Neisseria Gonorrhea: NEGATIVE
Trichomonas: NEGATIVE

## 2020-10-06 LAB — URINE CULTURE
MICRO NUMBER:: 11220935
SPECIMEN QUALITY:: ADEQUATE

## 2020-10-09 ENCOUNTER — Telehealth (HOSPITAL_COMMUNITY): Payer: Self-pay | Admitting: Emergency Medicine

## 2020-10-09 MED ORDER — FLUCONAZOLE 150 MG PO TABS: 150.0000 mg | ORAL_TABLET | Freq: Once | ORAL | 0 refills | Status: AC

## 2020-10-09 MED ORDER — METRONIDAZOLE 500 MG PO TABS: 500.0000 mg | ORAL_TABLET | Freq: Two times a day (BID) | ORAL | 0 refills | Status: DC

## 2020-10-11 ENCOUNTER — Ambulatory Visit: Payer: Medicaid Other | Admitting: Advanced Practice Midwife

## 2020-10-24 ENCOUNTER — Ambulatory Visit: Payer: Medicaid Other | Admitting: Family Medicine

## 2020-11-18 NOTE — L&D Delivery Note (Signed)
OB[facility]N Faculty Practice Delivery Note  Pamela Doyle is a 25 y.o. G2P1001 s/p pitocin at [redacted]w[redacted]d. She was admitted for SROM.   ROM: 17h 62m with clear fluid GBS Status: negative Maximum Maternal Temperature: 98.6*F  Labor Progress: Received pitocin   Delivery Date/Time: 06/19/21 at 6503 Delivery: Called to room and patient was complete and pushing. Head delivered ROA with compound arm. No nuchal cord present. Shoulder and body delivered in usual fashion. Infant with spontaneous cry, placed on mother's abdomen, dried and stimulated. Cord clamped x 2 after 1-minute delay, and cut by FOB. Cord blood drawn. Placenta delivered spontaneously with gentle cord traction. Fundus firm with massage and Pitocin. Labia, perineum, vagina, and cervix inspected inspected with no lacerations.   Placenta: 3VC, intact, to L&D Complications: none Lacerations: none EBL: 100cc Analgesia: epidural  Postpartum Planning [ ]  message to sent to schedule follow-up  [ ]  vaccines UTD  Infant: female, see chart for weight  APGARs 9,9   , MD [facility], PGY-3 06/19/2021, 8:41 AM

## 2020-12-06 ENCOUNTER — Other Ambulatory Visit (HOSPITAL_COMMUNITY)
Admission: RE | Admit: 2020-12-06 | Discharge: 2020-12-06 | Disposition: A | Discharge status: Home / Self Care | Payer: Medicaid Other | Source: Ambulatory Visit | Attending: Advanced Practice Midwife | Admitting: Advanced Practice Midwife

## 2020-12-06 ENCOUNTER — Other Ambulatory Visit: Payer: Self-pay

## 2020-12-06 ENCOUNTER — Ambulatory Visit (INDEPENDENT_AMBULATORY_CARE_PROVIDER_SITE_OTHER): Payer: Medicaid Other | Admitting: Advanced Practice Midwife

## 2020-12-06 ENCOUNTER — Encounter: Payer: Self-pay | Admitting: Advanced Practice Midwife

## 2020-12-06 VITALS — BP 128/83 | HR 84 | Wt 222.0 lb

## 2020-12-06 DIAGNOSIS — Z3491 Encounter for supervision of normal pregnancy, unspecified, first trimester: Secondary | ICD-10-CM | POA: Insufficient documentation

## 2020-12-06 DIAGNOSIS — R0981 Nasal congestion: Secondary | ICD-10-CM

## 2020-12-06 DIAGNOSIS — O09899 Supervision of other high risk pregnancies, unspecified trimester: Secondary | ICD-10-CM | POA: Insufficient documentation

## 2020-12-06 DIAGNOSIS — Z8669 Personal history of other diseases of the nervous system and sense organs: Secondary | ICD-10-CM

## 2020-12-06 DIAGNOSIS — O0990 Supervision of high risk pregnancy, unspecified, unspecified trimester: Secondary | ICD-10-CM | POA: Insufficient documentation

## 2020-12-06 DIAGNOSIS — O09891 Supervision of other high risk pregnancies, first trimester: Secondary | ICD-10-CM

## 2020-12-06 DIAGNOSIS — Z8759 Personal history of other complications of pregnancy, childbirth and the puerperium: Secondary | ICD-10-CM

## 2020-12-06 DIAGNOSIS — Z3A10 10 weeks gestation of pregnancy: Secondary | ICD-10-CM

## 2020-12-06 MED ORDER — ASPIRIN EC 81 MG PO TBEC: 81.0000 mg | DELAYED_RELEASE_TABLET | Freq: Every day | ORAL | 2 refills | Status: DC

## 2020-12-06 MED ORDER — LORATADINE 10 MG PO TABS: 10.0000 mg | ORAL_TABLET | Freq: Every day | ORAL | 3 refills | Status: DC

## 2020-12-06 NOTE — Progress Notes (Signed)
History:   Pamela Doyle is a 25 y.o. G2P1001 at 27w1dby 10 week ultrasound being seen today for her first obstetrical visit.  Her obstetrical history is significant for Gestational Hypertension Patient does intend to breast feed. Pregnancy history fully reviewed.  Patient reports no complaints. This is a desired pregnancy. She endorses recent migraine which last 4 days. Attempted management with Tylenol but is unsure of dose, thinks her pills at home at 200 or 250 mg and thinks she took 2.    24 hour diet recall includes bagel, 3 frozen burritos, a cup of [place] stew and Mac n Cheese. Primary source of hydration in that time is [place], Dr. PMalachi Bonds trying to be better about water.   HISTORY: OB History  Gravida Para Term Preterm AB Living  _0 0 0 1  SAB IAB Ectopic Multiple Live Births  0 0 0 0 1    # Outcome Date GA Lbr Len/2nd Weight Sex Delivery Anes PTL Lv  2 Current           1 Term 07/14/20 452w3d2:10 / 02:11 8 lb 5.5 oz (3.785 kg) M Vag-Spont EPI  LIV     Birth Comments: wnl     Name: Pamela Doyle   Apgar1: 8  Apgar5: 9    Last pap smear was done 12/2019 and was normal  Past Medical History:  Diagnosis Date  . Chronic kidney disease    mal rotated kidneys   Past Surgical History:  Procedure Laterality Date  . NO PAST SURGERIES     Family History  Adopted: Yes  Problem Relation Age of Onset  . Healthy Mother   . Drug abuse Father   . Breast cancer Maternal Grandmother    Social History   Tobacco Use  . Smoking status: Never Smoker  . Smokeless tobacco: Never Used  Vaping Use  . Vaping Use: Never used  Substance Use Topics  . Alcohol use: Yes    Comment: Socially  . Drug use: Not Currently   No Known Allergies Current Outpatient Medications on File Prior to Visit  Medication Sig Dispense Refill  . acetaminophen (TYLENOL) 325 MG tablet Take 2 tablets (650 mg total) by mouth every 6 (six) hours. 60 tablet 0  . Multiple Vitamin (MULTIVITAMIN  WITH MINERALS) TABS tablet Take 1 tablet by mouth daily.     No current facility-administered medications on file prior to visit.    Review of Systems Pertinent items noted in HPI and remainder of comprehensive ROS otherwise negative.  Physical Exam:   Vitals:   12/06/20 1028  BP: 128/83  Pulse: 84  Weight: 222 lb (100.7 kg)   Fetal Heart Rate (bpm): 160 Bedside Ultrasound for FHR check: Viable intrauterine pregnancy with positive cardiac activity noted Patient informed that the ultrasound is considered a limited obstetric ultrasound and is not intended to be a complete ultrasound exam.  Patient also informed that the ultrasound is not being completed with the intent of assessing for fetal or placental anomalies or any pelvic abnormalities.  Explained that the purpose of today's ultrasound is to assess for fetal heart rate.  Patient acknowledges the purpose of the exam and the limitations of the study. General: well-developed, well-nourished female in no acute distress  Skin: normal coloration and turgor, no rashes  Neurologic: oriented, normal, negative, normal mood  Extremities: normal strength, tone, and muscle mass, ROM of all joints is normal  HEENT PERRLA, extraocular movement intact and  sclera clear, anicteric  Neck supple and no masses  Cardiovascular: regular rate and rhythm  Respiratory:  no respiratory distress, normal breath sounds  Abdomen: soft, non-tender; bowel sounds normal; no masses,  no organomegaly  Pelvic: Deferred    Assessment:    Pregnancy: G2P1001 Patient Active Problem List   Diagnosis Date Noted  . Supervision of low-risk pregnancy, first trimester 12/06/2020  . Short interval between pregnancies affecting pregnancy in first trimester, antepartum 12/06/2020  . History of gestational hypertension 07/14/2020  . Gestational thrombocytopenia ([place]) 04/15/2020  . 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 Biological false positive RPR test 12/23/2019      Plan:    1. Supervision of low-risk pregnancy, first trimester - LOB - Focus on managing clean nutrition, increasing water intake. Discussed tricks for increasing hydration - CBC/D/Plt+RPR+Rh+ABO+Rub Ab... - Culture, OB Urine - Genetic Screening - GC/Chlamydia probe amp ([place])not at [facility] - [place] MFM OB COMP + 14 WK; Future  2. Short interval between pregnancies affecting pregnancy in first trimester, antepartum - S/p SVD 07/14/2020 - Would like to request same team if possible as it was such a positive experience - S/p carrier screening previous pregnancy  3. History of gestational hypertension - TSH - Comprehensive metabolic panel - Protein / creatinine ratio, urine - Hemoglobin A1c - aspirin EC 81 MG tablet; Take 1 tablet (81 mg total) by mouth daily. Take after 12 weeks for prevention of preeclampsia later in pregnancy  Dispense: 300 tablet; Refill: 2  4. [redacted] weeks gestation of pregnancy   5. H/O migraine during pregnancy - 1G Tylenol with first dose, then 650 mg q 4-6 hours as needed - Sinus congestion as trigger, advised daily Claritin, saline nasal spray - Clean and consistent eating may positively influence headache duration and frequency - Consider referral to [facility]  6. Sinus congestion - loratadine (CLARITIN) 10 MG tablet; Take 1 tablet (10 mg total) by mouth daily.  Dispense: 30 tablet; Refill: 3   Initial labs drawn. Continue prenatal vitamins. Problem list reviewed and updated. Genetic Screening discussed, First trimester screen, Quad screen and NIPS: ordered. Ultrasound discussed; fetal anatomic survey: ordered. Anticipatory guidance about prenatal visits given including labs, ultrasounds, and testing. Discussed usage of Babyscripts and virtual visits as additional source of managing and completing prenatal visits in midst of coronavirus and pandemic.   Encouraged to complete MyChart Registration for her ability to review results,  send requests, and have questions addressed.  The nature of [place] for [facility] with multiple MDs and Advanced Practice Providers was explained to patient; also emphasized that residents, students are part of our team. Routine obstetric precautions reviewed. Encouraged to seek out care at office or emergency room [facility] preferred) for urgent and/or emergent concerns. Return in about 4 weeks (around 01/03/2021).     Mallie Snooks, MSN, CNM Certified Nurse Midwife, [facility] for [employer], [place] 12/06/20 12:35 PM

## 2020-12-06 NOTE — Patient Instructions (Addendum)
Healthy Weight Gain During Pregnancy A certain amount of weight gain during pregnancy is normal and healthy. The amount of weight you should gain during pregnancy depends on your overall health and your weight before you became pregnant. Talk with your health care provider to find out how much weight you should gain during your pregnancy. General guidelines for a healthy total weight gain during pregnancy are based on your body mass index (BMI) and are listed below. If your BMI at or before the start of your pregnancy is:  Less than 18.5 (underweight), you should gain 28-40 lb (13-18 kg).  18.5-24.9 (normal weight), you should gain 25-35 lb (11-16 kg).  25-29.9 (overweight), you should gain 15-25 lb (7-11 kg).  30 or higher (obese), you should gain 11-20 lb (5-9 kg). Your health care provider may recommend that you:  Gain more weight, if you were underweight before pregnancy or if you are pregnant with more than one baby.  Gain less weight, if you were overweight before pregnancy or if you are gaining too much weight during your pregnancy. How does unhealthy weight gain affect me? Gaining too much weight during pregnancy can lead to pregnancy complications, such as:  A temporary form of diabetes that develops during pregnancy (gestational diabetes).  Hypertensive disorders of pregnancy (preeclampsia or gestational hypertension).  Raising your risk of having a more difficult delivery or a surgical delivery (cesarean delivery, or C-section). How does unhealthy weight gain affect my baby? Not gaining enough weight can be life-threatening for your baby. It may also raise these risks for your baby:  Being born early (preterm).  Being smaller than normal during pregnancy or not growing normally (intrauterine growth restriction).  Having a low weight at birth. Gaining too much weight may raise these risks for your baby:  Growing larger than normal during pregnancy (large for gestational  age or macrosomia).  Increased risk of obesity. What actions can I take to gain a healthy amount of weight during pregnancy? Nutrition  Eat healthy foods. Every day, try to eat: ? Fruits and vegetables. Include a variety of colors and types, like sweet potatoes, oranges, apples, bell peppers, beets, berries, squash, and broccoli. ? Whole grains, such as millet, barley, whole-wheat breads and cereals, and oatmeal. ? Low-fat dairy products, like yogurt, milk, and cheese. You can also include non-dairy choices, like almond milk or rice milk. ? Protein-rich foods, like lean meat, chicken, eggs, peas, and beans.  Avoid foods that are fried or have a lot of fat, salt (sodium), or sugar.  Drink enough fluid to keep your urine pale yellow.  Choose healthy snack and drink options when you are away from home: ? Drink water. Avoid soda, sports drinks, and juices that have added sugar. ? Avoid drinks with caffeine, such as coffee and energy drinks. ? Eat snacks that are high in protein, such as nuts, protein bars, and low-fat yogurt. ? Carry convenient snacks with you that do not need refrigeration, such as a pack of trail mix, an apple, or a granola bar.  If you need help improving your diet, work with a health care provider or a dietitian.   Activity  Exercise regularly, as told by your health care provider. ? If you were active before becoming pregnant, you may be able to continue your regular fitness activities. ? If you were not active before pregnancy, you may gradually build up to exercising for 30 or more minutes on most days of the week. This may include walking, swimming,   or yoga.  Ask your health care provider what activities are safe for you. Talk with your health care provider about whether you may need to be excused from certain school or work activities.   Follow these instructions at home:  Take over-the-counter and prescription medicines only as told by your health care  provider.  Take all prenatal supplements as told by your health care provider.  Keep track of your weight gain during pregnancy.  Keep all health care visits during pregnancy (prenatal visits). These visits are a good time to discuss your weight gain. Your health care provider will weigh you at each visit to make sure you are gaining a healthy amount of weight. Where to find support Some pregnant women and teens face unique challenges and need extra support. If you have questions or need help gaining a healthy amount of weight during pregnancy, these people may help:  Your health care provider.  A dietitian.  Your school nurse.  Your family and friends.  Local counseling centers, church groups, or clinics that have services for teens. Where to find more information Learn more about managing your weight gain during pregnancy from:  American Pregnancy Association: americanpregnancy.org  U.[place]. Department of Agriculture pregnancy weight gain calculator: WrestlingReporter.dk Contact a health care provider if:  You are unable to eat or drink for longer than 24 hours.  You cannot afford food or have trouble accessing regular meals. Get help right away if you: Summary  Talk with your health care provider to find out how much weight you should gain during your pregnancy.  Too much or too little weight gain during pregnancy can lead to complications for you and your baby.  Eat healthy foods like fruits and vegetables, whole grains, low-fat dairy products, and protein-rich foods.  Ask your health care provider what activities are safe for you.  Keep all of your prenatal visits. This information is not intended to replace advice given to you by your health care provider. Make sure you discuss any questions you have with your health care provider. Document Revised: 06/01/2020 Document Reviewed: 06/01/2020 Elsevier Patient Education  2021 [employer].  http://www.bray.com/.html">  First Trimester of Pregnancy  The first trimester of pregnancy starts on the first day of your last menstrual period until the end of week 12. This is also called months 1 through 3 of pregnancy. Body changes during your first trimester Your body goes through many changes during pregnancy. The changes usually return to normal after your baby is born. Physical changes  You may gain or lose weight.  Your breasts may grow larger and hurt. The area around your nipples may get darker.  Dark spots or blotches may develop on your face.  You may have changes in your hair. Health changes  You may feel like you might vomit (nauseous), and you may vomit.  You may have heartburn.  You may have headaches.  You may have trouble pooping (constipation).  Your gums may bleed. Other changes  You may get tired easily.  You may pee (urinate) more often.  Your menstrual periods will stop.  You may not feel hungry.  You may want to eat certain kinds of food.  You may have changes in your emotions from day to day.  You may have more dreams. Follow these instructions at home: Medicines  Take over-the-counter and prescription medicines only as told by your doctor. Some medicines are not safe during pregnancy.  Take a prenatal vitamin that contains at least  600 micrograms (mcg) of folic acid. Eating and drinking  Eat healthy meals that include: ? Fresh fruits and vegetables. ? Whole grains. ? Good sources of protein, such as meat, eggs, or tofu. ? Low-fat dairy products.  Avoid raw meat and unpasteurized juice, milk, and cheese.  If you feel like you may vomit, or you vomit: ? Eat 4 or 5 small meals a day instead of 3 large meals. ? Try eating a few soda crackers. ? Drink liquids between meals instead of during meals.  You may need to take these actions to prevent or treat trouble pooping: ? Drink enough fluids to keep your  pee (urine) pale yellow. ? Eat foods that are high in fiber. These include beans, whole grains, and fresh fruits and vegetables. ? Limit foods that are high in fat and sugar. These include fried or sweet foods. Activity  Exercise only as told by your doctor. Most people can do their usual exercise routine during pregnancy.  Stop exercising if you have cramps or pain in your lower belly (abdomen) or low back.  Do not exercise if it is too hot or too humid, or if you are in a place of great height (high altitude).  Avoid heavy lifting.  If you choose to, you may have sex unless your doctor tells you not to. Relieving pain and discomfort  Wear a good support bra if your breasts are sore.  Rest with your legs raised (elevated) if you have leg cramps or low back pain.  If you have bulging veins (varicose veins) in your legs: ? Wear support hose as told by your doctor. ? Raise your feet for 15 minutes, 3-4 times a day. ? Limit salt in your food. Safety  Wear your seat belt at all times when you are in a car.  Talk with your doctor if someone is hurting you or yelling at you.  Talk with your doctor if you are feeling sad or have thoughts of hurting yourself. Lifestyle  Do not use hot tubs, steam rooms, or saunas.  Do not douche. Do not use tampons or scented sanitary pads.  Do not use herbal medicines, illegal drugs, or medicines that are not approved by your doctor. Do not drink alcohol.  Do not smoke or use any products that contain nicotine or tobacco. If you need help quitting, ask your doctor.  Avoid cat litter boxes and soil that is used by cats. These carry germs that can cause harm to the baby and can cause a loss of your baby by miscarriage or stillbirth. General instructions  Keep all follow-up visits. This is important.  Ask for help if you need counseling or if you need help with nutrition. Your doctor can give you advice or tell you where to go for help.  Visit  your dentist. At home, brush your teeth with a soft toothbrush. Floss gently.  Write down your questions. Take them to your prenatal visits. Where to find more information  American Pregnancy Association: americanpregnancy.org  [employer] of Obstetricians and Gynecologists: www.acog.org  Office on Women's Health: MightyReward.co.nz Contact a doctor if:  You are dizzy.  You have a fever.  You have mild cramps or pressure in your lower belly.  You have a nagging pain in your belly area.  You continue to feel like you may vomit, you vomit, or you have watery poop (diarrhea) for 24 hours or longer.  You have a bad-smelling fluid coming from your vagina.  You have  pain when you pee.  You are exposed to a disease that spreads from person to person, such as chickenpox, measles, Zika virus, HIV, or hepatitis. Get help right away if:  You have spotting or bleeding from your vagina.  You have very bad belly cramping or pain.  You have shortness of breath or chest pain.  You have any kind of injury, such as from a fall or a car crash.  You have new or increased pain, swelling, or redness in an arm or leg. Summary  The first trimester of pregnancy starts on the first day of your last menstrual period until the end of week 12 (months 1 through 3).  Eat 4 or 5 small meals a day instead of 3 large meals.  Do not smoke or use any products that contain nicotine or tobacco. If you need help quitting, ask your doctor.  Keep all follow-up visits. This information is not intended to replace advice given to you by your health care provider. Make sure you discuss any questions you have with your health care provider. Document Revised: 04/12/2020 Document Reviewed: 02/17/2020 Elsevier Patient Education  2021 Elsevier Inc.  Safe Medications in Pregnancy   Acne: Benzoyl Peroxide Salicylic Acid  Backache/Headache: Tylenol: 2 regular strength every 4 hours OR               2 Extra strength every 6 hours  Colds/Coughs/Allergies: Benadryl (alcohol free) 25 mg every 6 hours as needed Breath right strips Claritin Cepacol throat lozenges Chloraseptic throat spray Cold-Eeze- up to three times per day Cough drops, alcohol free Flonase (by prescription only) Guaifenesin Mucinex Robitussin DM (plain only, alcohol free) Saline nasal spray/drops Sudafed (pseudoephedrine) & Actifed ** use only after [redacted] weeks gestation and if you do not have high blood pressure Tylenol Vicks Vaporub Zinc lozenges Zyrtec   Constipation: Colace Ducolax suppositories Fleet enema Glycerin suppositories Metamucil Milk of magnesia Miralax Senokot Smooth move tea  Diarrhea: Kaopectate Imodium A-D  *NO pepto Bismol  Hemorrhoids: Anusol Anusol HC Preparation H Tucks  Indigestion: Tums Maalox Mylanta Zantac  Pepcid  Insomnia: Benadryl (alcohol free) 25mg  every 6 hours as needed Tylenol PM Unisom, no Gelcaps  Leg Cramps: Tums MagGel  Nausea/Vomiting:  Bonine Dramamine Emetrol Ginger extract Sea bands Meclizine  Nausea medication to take during pregnancy:  Unisom (doxylamine succinate 25 mg tablets) Take one tablet daily at bedtime. If symptoms are not adequately controlled, the dose can be increased to a maximum recommended dose of two tablets daily (1/2 tablet in the morning, 1/2 tablet mid-afternoon and one at bedtime). Vitamin B6 100mg  tablets. Take one tablet twice a day (up to 200 mg per day).  Skin Rashes: Aveeno products Benadryl cream or 25mg  every 6 hours as needed Calamine Lotion 1% cortisone cream  Yeast infection: Gyne-lotrimin 7 Monistat 7   **If taking multiple medications, please check labels to avoid duplicating the same active ingredients **take medication as directed on the label ** Do not exceed 4000 mg of tylenol in 24 hours **Do not take medications that contain aspirin or ibuprofen

## 2020-12-06 NOTE — Progress Notes (Signed)
Getting migraines    DATING AND VIABILITY SONOGRAM   Pamela Doyle is a 25 y.o. year old G2P1001 with LMP Patient's last menstrual period was 09/18/2020 (exact date). which would correlate to  [redacted]w[redacted]d weeks gestation.  She has irregular menstrual cycles.   She is here today for a confirmatory initial sonogram.    GESTATION: SINGLETON yes   FETAL ACTIVITY:          Heart rate         160          The fetus is active.   GESTATIONAL AGE AND  BIOMETRICS:  Gestational criteria: Estimated Date of Delivery: 07/03/21 by early ultrasound now at [redacted]w[redacted]d  Previous Scans:0  GESTATIONAL SAC            mm         weeks  CROWN RUMP LENGTH           3.32 mm        10.1 weeks                                                   AVERAGE EGA(BY THIS SCAN):  10.1 weeks  WORKING EDD( early ultrasound ):  07/03/2021     TECHNICIAN COMMENTS: Patient informed that the ultrasound is considered a limited obstetric ultrasound and is not intended to be a complete ultrasound exam. Patient also informed that the ultrasound is not being completed with the intent of assessing for fetal or placental anomalies or any pelvic abnormalities. Explained that the purpose of today's ultrasound is to assess for fetal heart rate. Patient acknowledges the purpose of the exam and the limitations of the study.           A copy of this report including all images has been saved and backed up to a second source for retrieval if needed. All measures and details of the anatomical scan, placentation, fluid volume and pelvic anatomy are contained in that report.  Scheryl Marten 12/06/2020 10:55 AM

## 2020-12-07 LAB — COMPREHENSIVE METABOLIC PANEL
ALT: 6 IU/L (ref 0–32)
AST: 11 IU/L (ref 0–40)
Albumin/Globulin Ratio: 1.4 (ref 1.2–2.2)
Albumin: 4 g/dL (ref 3.9–5.0)
Alkaline Phosphatase: 79 IU/L (ref 44–121)
BUN/Creatinine Ratio: 11 (ref 9–23)
BUN: 7 mg/dL (ref 6–20)
Bilirubin Total: 0.2 mg/dL (ref 0.0–1.2)
CO2: 19 mmol/L — ABNORMAL LOW (ref 20–29)
Calcium: 9.3 mg/dL (ref 8.7–10.2)
Chloride: 102 mmol/L (ref 96–106)
Creatinine, Ser: 0.65 mg/dL (ref 0.57–1.00)
GFR calc Af Amer: 144 mL/min/{1.73_m2} (ref >59)
GFR calc non Af Amer: 125 mL/min/{1.73_m2} (ref >59)
Globulin, Total: 2.9 g/dL (ref 1.5–4.5)
Glucose: 89 mg/dL (ref 65–99)
Potassium: 4.1 mmol/L (ref 3.5–5.2)
Sodium: 134 mmol/L (ref 134–144)
Total Protein: 6.9 g/dL (ref 6.0–8.5)

## 2020-12-07 LAB — CBC/D/PLT+RPR+RH+ABO+RUB AB...
Antibody Screen: NEGATIVE
Basophils Absolute: 0 10*3/uL (ref 0.0–0.2)
Basos: 0 %
EOS (ABSOLUTE): 0.1 10*3/uL (ref 0.0–0.4)
Eos: 1 %
HCV Ab: <0.1 s/co ratio (ref 0.0–0.9)
HIV Screen 4th Generation wRfx: NONREACTIVE
Hematocrit: 39.6 % (ref 34.0–46.6)
Hemoglobin: 13.4 g/dL (ref 11.1–15.9)
Hepatitis B Surface Ag: NEGATIVE
Immature Grans (Abs): 0 10*3/uL (ref 0.0–0.1)
Immature Granulocytes: 0 %
Lymphocytes Absolute: 1.5 10*3/uL (ref 0.7–3.1)
Lymphs: 20 %
MCH: 29.4 pg (ref 26.6–33.0)
MCHC: 33.8 g/dL (ref 31.5–35.7)
MCV: 87 fL (ref 79–97)
Monocytes Absolute: 0.4 10*3/uL (ref 0.1–0.9)
Monocytes: 6 %
Neutrophils Absolute: 5.4 10*3/uL (ref 1.4–7.0)
Neutrophils: 73 %
Platelets: 172 10*3/uL (ref 150–450)
RBC: 4.56 x10E6/uL (ref 3.77–5.28)
RDW: 12.8 % (ref 11.7–15.4)
RPR Ser Ql: REACTIVE — AB
Rh Factor: POSITIVE
Rubella Antibodies, IGG: 1.01 index (ref >0.99)
WBC: 7.5 10*3/uL (ref 3.4–10.8)

## 2020-12-07 LAB — RPR, QUANT+TP ABS (REFLEX)
Rapid Plasma Reagin, Quant: 1:1 {titer} — ABNORMAL HIGH
T Pallidum Abs: NONREACTIVE

## 2020-12-07 LAB — PROTEIN / CREATININE RATIO, URINE
Creatinine, Urine: 198.3 mg/dL
Protein, Ur: 12.6 mg/dL
Protein/Creat Ratio: 64 mg/g creat (ref 0–200)

## 2020-12-07 LAB — GC/CHLAMYDIA PROBE AMP (~~LOC~~) NOT AT ARMC
Chlamydia: NEGATIVE
Comment: NEGATIVE
Comment: NORMAL
Neisseria Gonorrhea: NEGATIVE

## 2020-12-07 LAB — TSH: TSH: 2.09 u[IU]/mL (ref 0.450–4.500)

## 2020-12-07 LAB — HCV INTERPRETATION

## 2020-12-07 LAB — HEMOGLOBIN A1C
Est. average glucose Bld gHb Est-mCnc: 103 mg/dL
Hgb A1c MFr Bld: 5.2 % (ref 4.8–5.6)

## 2020-12-08 LAB — URINE CULTURE, OB REFLEX

## 2020-12-08 LAB — CULTURE, OB URINE

## 2020-12-15 ENCOUNTER — Telehealth: Payer: Self-pay | Admitting: Radiology

## 2020-12-15 ENCOUNTER — Encounter: Payer: Self-pay | Admitting: Radiology

## 2020-12-15 NOTE — Telephone Encounter (Signed)
Left voicemail and sent mychart message with Panorama results.

## 2020-12-18 ENCOUNTER — Encounter: Payer: Self-pay | Admitting: Radiology

## 2021-01-03 ENCOUNTER — Encounter: Payer: Medicaid Other | Admitting: Family Medicine

## 2021-01-03 ENCOUNTER — Telehealth (INDEPENDENT_AMBULATORY_CARE_PROVIDER_SITE_OTHER): Payer: Medicaid Other | Admitting: Family Medicine

## 2021-01-03 ENCOUNTER — Other Ambulatory Visit: Payer: Self-pay

## 2021-01-03 VITALS — BP 123/80 | HR 80

## 2021-01-03 DIAGNOSIS — O09891 Supervision of other high risk pregnancies, first trimester: Secondary | ICD-10-CM

## 2021-01-03 DIAGNOSIS — F321 Major depressive disorder, single episode, moderate: Secondary | ICD-10-CM

## 2021-01-03 DIAGNOSIS — O99342 Other mental disorders complicating pregnancy, second trimester: Secondary | ICD-10-CM

## 2021-01-03 DIAGNOSIS — Z8759 Personal history of other complications of pregnancy, childbirth and the puerperium: Secondary | ICD-10-CM

## 2021-01-03 DIAGNOSIS — Z3491 Encounter for supervision of normal pregnancy, unspecified, first trimester: Secondary | ICD-10-CM

## 2021-01-03 DIAGNOSIS — Z141 Cystic fibrosis carrier: Secondary | ICD-10-CM

## 2021-01-03 DIAGNOSIS — O09892 Supervision of other high risk pregnancies, second trimester: Secondary | ICD-10-CM

## 2021-01-03 DIAGNOSIS — Z3A14 14 weeks gestation of pregnancy: Secondary | ICD-10-CM

## 2021-01-03 MED ORDER — SERTRALINE HCL 25 MG PO TABS: 25.0000 mg | ORAL_TABLET | Freq: Every day | ORAL | 1 refills | Status: DC

## 2021-01-03 NOTE — Progress Notes (Signed)
PHQ-9: 23

## 2021-01-03 NOTE — Progress Notes (Signed)
OBSTETRICS PRENATAL VIRTUAL VISIT ENCOUNTER NOTE  Provider location: Center for [facility]   I connected with Pamela Doyle on 01/03/21 at  9:40 AM EST by MyChart Video Encounter at home and verified that I am speaking with the correct person using two identifiers.   I discussed the limitations, risks, security and privacy concerns of performing an evaluation and management service virtually and the availability of in person appointments. I also discussed with the patient that there may be a patient responsible charge related to this service. The patient expressed understanding and agreed to proceed. Subjective:  Pamela Doyle is a 25 y.o. G2P1001 at [redacted]w[redacted]d being seen today for ongoing prenatal care.  She is currently monitored for the following issues for this low-risk pregnancy and has Biological false positive RPR test; Cystic fibrosis carrier; Gestational thrombocytopenia (HCC); History of gestational hypertension; Supervision of low-risk pregnancy, first trimester; and Short interval between pregnancies affecting pregnancy in first trimester, antepartum on their problem list.  Patient reports depressed mood, with increasing crying, anhedonia, poor appetite, weight loss.  Contractions: Not present. Vag. Bleeding: None.   . Denies any leaking of fluid.   The following portions of the patient's history were reviewed and updated as appropriate: allergies, current medications, past family history, past medical history, past social history, past surgical history and problem list.   Objective:   Vitals:   01/03/21 0950 01/03/21 1024  BP: (!) 141/81 123/80  Pulse: 80     Fetal Status:           General:  Alert, oriented and cooperative. Patient is in no acute distress.  Respiratory: Normal respiratory effort, no problems with respiration noted  Mental Status: Normal mood and affect. Normal behavior. Normal judgment and thought content.  Rest of physical exam deferred  due to type of encounter  Imaging: No results found.  Assessment and Plan:  Pregnancy: G2P1001 at [redacted]w[redacted]d 1. Current moderate episode of major depressive disorder without prior episode (HCC) R/B/A of medication reviewed at length, begin low dose, and increase, usual efficacy, no SI, contracts for safety, time to meet expected targets, mindfulness, self care, referral to [facility]. - sertraline (ZOLOFT) 25 MG tablet; Take 1 tablet (25 mg total) by mouth daily. Start with 1/2 tab for 5-7 days  Dispense: 30 tablet; Refill: 1 - Ambulatory referral to [facility]  2. Supervision of low-risk pregnancy, first trimester Has anatomy u/s scheduled  3. Short interval between pregnancies affecting pregnancy in first trimester, antepartum   4. History of gestational hypertension On ASA   General obstetric precautions including but not limited to vaginal bleeding, contractions, leaking of fluid and fetal movement were reviewed in detail with the patient. I discussed the assessment and treatment plan with the patient. The patient was provided an opportunity to ask questions and all were answered. The patient agreed with the plan and demonstrated an understanding of the instructions. The patient was advised to call back or seek an in-person office evaluation/go to MAU at [facility] for any urgent or concerning symptoms. Please refer to After Visit Summary for other counseling recommendations.   I provided 22 minutes of face-to-face time during this encounter.  Return in 4 weeks (on 01/31/2021).  Future Appointments  Date Time Provider Department Center  01/09/2021 10:00 AM Gwyndolyn Saxon, LCSW [facility] None  01/31/2021  1:50 PM Calvert Cantor, CNM [facility]  02/06/2021 10:15 AM [facility] US2 [facility]  02/09/2021 10:15 AM Teague Silvio Clayman  E, PA-C [facility]    Reva Bores, MD Center for [employer], [facility]

## 2021-01-03 NOTE — Patient Instructions (Signed)

## 2021-01-09 ENCOUNTER — Encounter: Payer: Medicaid Other | Admitting: Licensed Clinical Social Worker

## 2021-01-15 ENCOUNTER — Telehealth: Payer: Self-pay | Admitting: Licensed Clinical Social Worker

## 2021-01-15 ENCOUNTER — Encounter: Payer: Medicaid Other | Admitting: Licensed Clinical Social Worker

## 2021-01-15 NOTE — Telephone Encounter (Signed)
Called pt regarding scheduled mychart visit. Left message for callback  

## 2021-01-31 ENCOUNTER — Other Ambulatory Visit (HOSPITAL_COMMUNITY)
Admission: RE | Admit: 2021-01-31 | Discharge: 2021-01-31 | Disposition: A | Discharge status: Home / Self Care | Payer: Medicaid Other | Source: Ambulatory Visit | Attending: Advanced Practice Midwife | Admitting: Advanced Practice Midwife

## 2021-01-31 ENCOUNTER — Ambulatory Visit (INDEPENDENT_AMBULATORY_CARE_PROVIDER_SITE_OTHER): Payer: Medicaid Other | Admitting: Advanced Practice Midwife

## 2021-01-31 ENCOUNTER — Other Ambulatory Visit: Payer: Self-pay

## 2021-01-31 VITALS — BP 108/70 | HR 109 | Wt 217.0 lb

## 2021-01-31 DIAGNOSIS — N898 Other specified noninflammatory disorders of vagina: Secondary | ICD-10-CM

## 2021-01-31 DIAGNOSIS — Z3A18 18 weeks gestation of pregnancy: Secondary | ICD-10-CM

## 2021-01-31 DIAGNOSIS — Z3492 Encounter for supervision of normal pregnancy, unspecified, second trimester: Secondary | ICD-10-CM

## 2021-01-31 NOTE — Patient Instructions (Signed)

## 2021-01-31 NOTE — Progress Notes (Addendum)
   PRENATAL VISIT NOTE  Subjective:  Pamela Doyle is a 25 y.o. G2P1001 at [redacted]w[redacted]d being seen today for ongoing prenatal care.  She is currently monitored for the following issues for this low-risk pregnancy and has Biological false positive RPR test; Cystic fibrosis carrier; Gestational thrombocytopenia (HCC); History of gestational hypertension; Supervision of low-risk pregnancy, first trimester; and Short interval between pregnancies affecting pregnancy in first trimester, antepartum on their problem list.  Patient reports vaginal irritation and abnormal discharge c/w yeast infection.  Contractions: Not present. Vag. Bleeding: None.   . Denies leaking of fluid.   The following portions of the patient's history were reviewed and updated as appropriate: allergies, current medications, past family history, past medical history, past social history, past surgical history and problem list. Problem list updated.  Objective:   Vitals:   01/31/21 1342  BP: 108/70  Pulse: (!) 109  Weight: 217 lb (98.4 kg)    Fetal Status: Fetal Heart Rate (bpm): 144         General:  Alert, oriented and cooperative. Patient is in no acute distress.  Skin: Skin is warm and dry. No rash noted.   Cardiovascular: Normal heart rate noted  Respiratory: Normal respiratory effort, no problems with respiration noted  Abdomen: Soft, gravid, appropriate for gestational age.  Pain/Pressure: Absent     Pelvic: Cervical exam deferred        Extremities: Normal range of motion.  Edema: None  Mental Status: Normal mood and affect. Normal behavior. Normal judgment and thought content.   Assessment and Plan:  Pregnancy: G2P1001 at [redacted]w[redacted]d  1. Supervision of low-risk pregnancy, second trimester - Routine care - Declined AFP - Anatomy scan with MFM next week  2. Vaginal discharge - Self swab - Reassured OTC Monistat is safe in pregnancy, patient can treat if between visits and unable to drive 40 minutes to office -  Cervicovaginal ancillary only  3. [redacted] weeks gestation of pregnancy   Preterm labor symptoms and general obstetric precautions including but not limited to vaginal bleeding, contractions, leaking of fluid and fetal movement were reviewed in detail with the patient. Please refer to After Visit Summary for other counseling recommendations.  Return in about 4 weeks (around 02/28/2021).  Future Appointments  Date Time Provider Department Center  02/06/2021 10:15 AM [facility]2 [facility]  02/09/2021 10:15 AM Teague Edwena Blow, PA-C [facility]  03/01/2021  3:00 PM Anyanwu, Jethro Bastos, MD [facility]    Calvert Cantor, CNM

## 2021-02-01 LAB — CERVICOVAGINAL ANCILLARY ONLY
Bacterial Vaginitis (gardnerella): POSITIVE — AB
Candida Glabrata: NEGATIVE
Candida Vaginitis: POSITIVE — AB
Comment: NEGATIVE
Comment: NEGATIVE
Comment: NEGATIVE

## 2021-02-02 ENCOUNTER — Other Ambulatory Visit: Payer: Self-pay | Admitting: Family Medicine

## 2021-02-02 ENCOUNTER — Other Ambulatory Visit: Payer: Self-pay | Admitting: *Deleted

## 2021-02-02 DIAGNOSIS — F321 Major depressive disorder, single episode, moderate: Secondary | ICD-10-CM

## 2021-02-02 MED ORDER — TERCONAZOLE 0.8 % VA CREA: 1.0000 | TOPICAL_CREAM | Freq: Every day | VAGINAL | 1 refills | Status: DC

## 2021-02-02 MED ORDER — METRONIDAZOLE 500 MG PO TABS: 500.0000 mg | ORAL_TABLET | Freq: Two times a day (BID) | ORAL | 0 refills | Status: DC

## 2021-02-06 ENCOUNTER — Other Ambulatory Visit: Payer: Self-pay | Admitting: *Deleted

## 2021-02-06 ENCOUNTER — Other Ambulatory Visit: Payer: Self-pay | Admitting: Advanced Practice Midwife

## 2021-02-06 ENCOUNTER — Ambulatory Visit: Payer: Medicaid Other | Attending: Advanced Practice Midwife

## 2021-02-06 ENCOUNTER — Other Ambulatory Visit: Payer: Self-pay

## 2021-02-06 DIAGNOSIS — O99892 Other specified diseases and conditions complicating childbirth: Secondary | ICD-10-CM

## 2021-02-06 DIAGNOSIS — Z3491 Encounter for supervision of normal pregnancy, unspecified, first trimester: Secondary | ICD-10-CM

## 2021-02-06 DIAGNOSIS — E669 Obesity, unspecified: Secondary | ICD-10-CM

## 2021-02-06 DIAGNOSIS — Z363 Encounter for antenatal screening for malformations: Secondary | ICD-10-CM | POA: Insufficient documentation

## 2021-02-06 DIAGNOSIS — F32A Depression, unspecified: Secondary | ICD-10-CM | POA: Diagnosis not present

## 2021-02-06 DIAGNOSIS — Z362 Encounter for other antenatal screening follow-up: Secondary | ICD-10-CM

## 2021-02-06 DIAGNOSIS — O99212 Obesity complicating pregnancy, second trimester: Secondary | ICD-10-CM | POA: Diagnosis not present

## 2021-02-06 DIAGNOSIS — Z3A19 19 weeks gestation of pregnancy: Secondary | ICD-10-CM | POA: Insufficient documentation

## 2021-02-06 DIAGNOSIS — O99342 Other mental disorders complicating pregnancy, second trimester: Secondary | ICD-10-CM | POA: Diagnosis not present

## 2021-02-09 ENCOUNTER — Institutional Professional Consult (permissible substitution): Payer: Medicaid Other | Admitting: Physician Assistant

## 2021-03-01 ENCOUNTER — Encounter: Payer: Self-pay | Admitting: Obstetrics & Gynecology

## 2021-03-01 ENCOUNTER — Telehealth (INDEPENDENT_AMBULATORY_CARE_PROVIDER_SITE_OTHER): Payer: Medicaid Other | Admitting: Obstetrics & Gynecology

## 2021-03-01 ENCOUNTER — Telehealth: Payer: Self-pay | Admitting: Radiology

## 2021-03-01 ENCOUNTER — Other Ambulatory Visit: Payer: Self-pay

## 2021-03-01 DIAGNOSIS — O09892 Supervision of other high risk pregnancies, second trimester: Secondary | ICD-10-CM

## 2021-03-01 DIAGNOSIS — Z3A22 22 weeks gestation of pregnancy: Secondary | ICD-10-CM

## 2021-03-01 DIAGNOSIS — Z3480 Encounter for supervision of other normal pregnancy, unspecified trimester: Secondary | ICD-10-CM

## 2021-03-01 DIAGNOSIS — O99112 Other diseases of the blood and blood-forming organs and certain disorders involving the immune mechanism complicating pregnancy, second trimester: Secondary | ICD-10-CM

## 2021-03-01 DIAGNOSIS — Z3491 Encounter for supervision of normal pregnancy, unspecified, first trimester: Secondary | ICD-10-CM

## 2021-03-01 DIAGNOSIS — F32A Depression, unspecified: Secondary | ICD-10-CM

## 2021-03-01 DIAGNOSIS — Z141 Cystic fibrosis carrier: Secondary | ICD-10-CM

## 2021-03-01 DIAGNOSIS — D696 Thrombocytopenia, unspecified: Secondary | ICD-10-CM

## 2021-03-01 DIAGNOSIS — O99342 Other mental disorders complicating pregnancy, second trimester: Secondary | ICD-10-CM

## 2021-03-01 DIAGNOSIS — O99340 Other mental disorders complicating pregnancy, unspecified trimester: Secondary | ICD-10-CM

## 2021-03-01 DIAGNOSIS — Z8759 Personal history of other complications of pregnancy, childbirth and the puerperium: Secondary | ICD-10-CM

## 2021-03-01 NOTE — Patient Instructions (Signed)
Return to office for any scheduled appointments. Call the office or go to the [facility] at [facility] [place] if:  You begin to have strong, frequent contractions  Your water breaks.  Sometimes it is a big gush of fluid, sometimes it is just a trickle that keeps getting your panties wet or running down your legs  You have vaginal bleeding.  It is normal to have a small amount of spotting if your cervix was checked.   You do not feel your baby moving like normal.  If you do not, get something to eat and drink and lay down and focus on feeling your baby move.   If your baby is still not moving like normal, you should call the office or go to MAU.  Any other obstetric concerns.   

## 2021-03-01 NOTE — Progress Notes (Signed)
OBSTETRICS PRENATAL VIRTUAL VISIT ENCOUNTER NOTE  Provider location: Center for [facility] at [facility]   Patient location: Home  I connected with Pamela Doyle on 03/01/21 at 11:15 AM EDT by MyChart Video Encounter and verified that I am speaking with the correct person using two identifiers. I discussed the limitations, risks, security and privacy concerns of performing an evaluation and management service virtually and the availability of in person appointments. I also discussed with the patient that there may be a patient responsible charge related to this service. The patient expressed understanding and agreed to proceed. Subjective:  Pamela Doyle is a 25 y.o. G2P1001 at [redacted]w[redacted]d being seen today for ongoing prenatal care.  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  Patient reports no complaints. No longer on Zoloft, feels the depression episode earlier was situational, weaned herself off this medication. No current concerning symptoms.   Contractions: Not present. Vag. Bleeding: None.  Movement: Present. Denies any leaking of fluid.   The following portions of the patient's history were reviewed and updated as appropriate: allergies, current medications, past family history, past medical history, past social history, past surgical history and problem list.   Objective:  There were no vitals filed for this visit.  Fetal Status:     Movement: Present     General:  Alert, oriented and cooperative. Patient is in no acute distress.  Respiratory: Normal respiratory effort, no problems with respiration noted  Mental Status: Normal mood and affect. Normal behavior. Normal judgment and thought content.   Rest of physical exam deferred due to type of encounter  Imaging: [place] MFM OB DETAIL +14 WK  Result Date: 02/06/2021 ----------------------------------------------------------------------  OBSTETRICS REPORT                       (Signed Final 02/06/2021 11:55 am) ---------------------------------------------------------------------- Patient Info  ID #:       [id number]                          D.O.B.:  [birth year] (25 yrs)  Name:       Pamela Doyle                     Visit Date: 02/06/2021 10:22 am ---------------------------------------------------------------------- Performed By  Attending:        Ma Rings MD         Referred By:      Calvert Cantor  Performed By:     Reinaldo Raddle            [facility] at                                                             [facility] for                                                             Women ---------------------------------------------------------------------- Orders  #  Description                           Code        Ordered By  1  [place] MFM OB DETAIL +14 WK               76811.01    Clayton Bibles ----------------------------------------------------------------------  #  Order #                     Accession #                Episode #  1  [id number]                   [id number]                 [id number] ---------------------------------------------------------------------- Indications  Obesity complicating pregnancy, second         O99.212  trimester (BMI 33)  Encounter for antenatal screening for          Z36.3  malformations (LR NIPS)  Short interval between pregancies, 2nd         O09.892  trimester  Medical complication of pregnancy              O26.90  (Depression)  [redacted] weeks gestation of pregnancy  ---------------------------------------------------------------------- Fetal Evaluation  Num Of Fetuses:         1  Fetal Heart Rate(bpm):  147  Cardiac Activity:       Observed  Presentation:           Variable  Placenta:               Anterior  P. Cord Insertion:      Visualized  Amniotic Fluid  AFI FV:      Within normal limits                              Largest Pocket(cm)                              3.79 ---------------------------------------------------------------------- Biometry  BPD:      44.2  mm     G. Age:  19w 3d         67  %  CI:        72.36   %    70 - 86                                                          FL/HC:      16.6   %    16.1 - 18.3  HC:      165.3  mm     G. Age:  19w 2d         55  %    HC/AC:      1.17        1.09 - 1.39  AC:      141.3  mm     G. Age:  19w 3d         63  %    FL/BPD:     62.0   %  FL:       27.4  mm     G. Age:  18w 3d         22  %    FL/AC:      19.4   %    20 - 24  HUM:      26.1  mm     G. Age:  18w 1d         29  %  CER:      18.1  mm     G. Age:  18w 0d        3.7  %  NFT:       5.0  mm  LV:        5.8  mm  CM:        4.6  mm  Est. FW:     269  gm      0 lb 9 oz     46  % ---------------------------------------------------------------------- OB History  Gravidity:    2  Living:       1 ---------------------------------------------------------------------- Gestational Age  LMP:           20w 1d        Date:  09/18/20                 EDD:   06/25/21  U/S Today:     19w 1d                                        EDD:   07/02/21  Best:          19w 0d     Det. By:  Previous Ultrasound      EDD:   07/03/21 ---------------------------------------------------------------------- Anatomy  Cranium:               Appears normal         Aortic Arch:            Appears normal  Cavum:                 Appears normal         Ductal Arch:            Not well visualized  Ventricles:  Appears normal         Diaphragm:              Appears normal  Choroid Plexus:         Appears normal         Stomach:                Appears normal, left                                                                        sided  Cerebellum:            Appears normal         Abdomen:                Appears normal  Posterior Fossa:       Appears normal         Abdominal Wall:         Appears nml (cord                                                                        insert, abd wall)  Nuchal Fold:           Appears normal         Cord Vessels:           Appears normal (3                                                                        vessel cord)  Face:                  Appears normal         Kidneys:                Left vis, Right nwv                         (orbits and profile)  Lips:                  Not well visualized    Bladder:                Appears normal  Thoracic:              Appears normal         Spine:                  Previously seen  Heart:                 Not well visualized    Upper Extremities:      Appears normal  RVOT:  Appears normal         Lower Extremities:      Appears normal  LVOT:                  Appears normal  Other:  Hands and feet visualized. Lenses visualized. Nasal bone visualized.          Fetus appears to be a female. Heels and 5th digit visualized. ---------------------------------------------------------------------- Cervix Uterus Adnexa  Cervix  Length:           3.93  cm.  Not visualized (advanced GA >24wks)  Uterus  No abnormality visualized.  Right Ovary  Not visualized.  Left Ovary  Not visualized.  Cul De Sac  No free fluid seen.  Adnexa  No adnexal mass visualized. ---------------------------------------------------------------------- Comments  This patient was seen for a detailed fetal anatomy scan due  to maternal obesity.  She denies any significant past medical history and denies  any problems in her current pregnancy.  She had a cell free DNA test earlier in her pregnancy which  indicated a low risk for trisomy 3521,  6518, and 13. A female fetus is  predicted.  She was informed that the fetal growth and amniotic fluid  level were appropriate for her gestational age.  There were no obvious fetal anomalies noted on today's  ultrasound exam.  However, the views of the fetal anatomy  were limited today due to the fetal position.  The patient was informed that anomalies may be missed due  to technical limitations. If the fetus is in a suboptimal position  or maternal habitus is increased, visualization of the fetus in  the maternal uterus may be impaired.  A follow-up exam was scheduled in 4 weeks to complete the  views of the fetal anatomy. ----------------------------------------------------------------------                   Ma RingsVictor Fang, MD Electronically Signed Final Report   02/06/2021 11:55 am ----------------------------------------------------------------------   Assessment and Plan:  Pregnancy: G2P1001 at 2347w2d 1. Depression during pregnancy, antepartum No current symptoms.  Emphasized she needs to let us know if symptoms recur. Zolft removed from her medication list.   2. [redacted] weeks gestation of pregnancy 3. Supervision of other normal pregnancy, antepartum Has follow up anatomy scan on 03/06/21, will follow up results and manage accordingly. Preterm labor symptoms and general obstetric precautions including but not limited to vaginal bleeding, contractions, leaking of fluid and fetal movement were reviewed in detail with the patient. I discussed the assessment and treatment plan with the patient. The patient was provided an opportunity to ask questions and all were answered. The patient agreed with the plan and demonstrated an understanding of the instructions. The patient was advised to call back or seek an in-person office evaluation/[facility] for any urgent or concerning symptoms. Please refer to After Visit Summary for other counseling recommendations.   I provided 10 minutes of  face-to-face time during this encounter.  Return in about 4 weeks (around 03/29/2021) for Virtual OB Visit (MD or APP)  6 weeks from now: 2 hr GTT, labs, Tdap, OFFICE OB Visit (MD or APP).  Future Appointments  Date Time Provider Department Center  03/06/2021  [facility]  03/06/2021  3:15 PM [facility] US2 [facility]  03/28/2021  2:00 PM Briant SitesWeinhold, Samantha C, CNM [facility]  04/11/2021  8:50 AM Calvert CantorWeinhold, Samantha C, CNM [facility]    Vela ProseUgonna  Macon Large, MD Center for [employer], [facility]

## 2021-03-01 NOTE — Telephone Encounter (Signed)
Left voicemail with 04/11/21 @ 8:50 appt information

## 2021-03-06 ENCOUNTER — Ambulatory Visit: Payer: Medicaid Other | Admitting: *Deleted

## 2021-03-06 ENCOUNTER — Ambulatory Visit: Payer: Medicaid Other | Attending: Obstetrics

## 2021-03-06 ENCOUNTER — Other Ambulatory Visit: Payer: Self-pay

## 2021-03-06 ENCOUNTER — Encounter: Payer: Self-pay | Admitting: *Deleted

## 2021-03-06 VITALS — BP 122/67 | HR 82

## 2021-03-06 DIAGNOSIS — O09892 Supervision of other high risk pregnancies, second trimester: Secondary | ICD-10-CM | POA: Diagnosis not present

## 2021-03-06 DIAGNOSIS — F32A Depression, unspecified: Secondary | ICD-10-CM

## 2021-03-06 DIAGNOSIS — O99212 Obesity complicating pregnancy, second trimester: Secondary | ICD-10-CM

## 2021-03-06 DIAGNOSIS — O99342 Other mental disorders complicating pregnancy, second trimester: Secondary | ICD-10-CM

## 2021-03-06 DIAGNOSIS — E669 Obesity, unspecified: Secondary | ICD-10-CM

## 2021-03-06 DIAGNOSIS — Z362 Encounter for other antenatal screening follow-up: Secondary | ICD-10-CM | POA: Insufficient documentation

## 2021-03-06 DIAGNOSIS — Z6833 Body mass index (BMI) 33.0-33.9, adult: Secondary | ICD-10-CM

## 2021-03-06 DIAGNOSIS — Z3A23 23 weeks gestation of pregnancy: Secondary | ICD-10-CM

## 2021-03-28 ENCOUNTER — Telehealth (INDEPENDENT_AMBULATORY_CARE_PROVIDER_SITE_OTHER): Payer: Medicaid Other | Admitting: Advanced Practice Midwife

## 2021-03-28 ENCOUNTER — Other Ambulatory Visit: Payer: Self-pay

## 2021-03-28 DIAGNOSIS — Z3A26 26 weeks gestation of pregnancy: Secondary | ICD-10-CM

## 2021-03-28 DIAGNOSIS — D696 Thrombocytopenia, unspecified: Secondary | ICD-10-CM

## 2021-03-28 DIAGNOSIS — O99112 Other diseases of the blood and blood-forming organs and certain disorders involving the immune mechanism complicating pregnancy, second trimester: Secondary | ICD-10-CM

## 2021-03-28 DIAGNOSIS — Z8759 Personal history of other complications of pregnancy, childbirth and the puerperium: Secondary | ICD-10-CM

## 2021-03-28 DIAGNOSIS — Z141 Cystic fibrosis carrier: Secondary | ICD-10-CM

## 2021-03-28 DIAGNOSIS — Z3480 Encounter for supervision of other normal pregnancy, unspecified trimester: Secondary | ICD-10-CM

## 2021-03-28 DIAGNOSIS — O09892 Supervision of other high risk pregnancies, second trimester: Secondary | ICD-10-CM

## 2021-03-28 NOTE — Progress Notes (Signed)
I connected with  Pamela Doyle on 03/28/21 at  2:00 PM EDT by telephone and verified that I am speaking with the correct person using two identifiers.   I discussed the limitations, risks, security and privacy concerns of performing an evaluation and management service by telephone and the availability of in person appointments. I also discussed with the patient that there may be a patient responsible charge related to this service. The patient expressed understanding and agreed to proceed.  Scheryl Marten, RN 03/28/2021  2:00 PM

## 2021-03-28 NOTE — Progress Notes (Signed)
OBSTETRICS PRENATAL VIRTUAL VISIT ENCOUNTER NOTE  Provider location: [facility] for [facility] at [facility]   Patient location: Home  I connected with Pamela Doyle on 03/28/21 at  2:00 PM EDT by MyChart Video Encounter and verified that I am speaking with the correct person using two identifiers. I discussed the limitations, risks, security and privacy concerns of performing an evaluation and management service virtually and the availability of in person appointments. I also discussed with the patient that there may be a patient responsible charge related to this service. The patient expressed understanding and agreed to proceed. Subjective:  Pamela Doyle is a 25 y.o. G2P1001 at [redacted]w[redacted]d being seen today for ongoing prenatal care.  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  Patient reports no complaints.  Contractions: Not present. Vag. Bleeding: None.  Movement: Present. Denies any leaking of fluid.   The following portions of the patient's history were reviewed and updated as appropriate: allergies, current medications, past family history, past medical history, past social history, past surgical history and problem list.   Objective:  There were no vitals filed for this visit.  Fetal Status:     Movement: Present     General:  Alert, oriented and cooperative. Patient is in no acute distress.  Respiratory: Normal respiratory effort, no problems with respiration noted  Mental Status: Normal mood and affect. Normal behavior. Normal judgment and thought content.  Rest of physical exam deferred due to type of encounter  Imaging: [place] MFM OB FOLLOW UP  Result Date:  03/06/2021 ----------------------------------------------------------------------  OBSTETRICS REPORT                       (Signed Final 03/06/2021 06:19 pm) ---------------------------------------------------------------------- Patient Info  ID #:       [id number]                          D.O.B.:  Nov 11, 1996 (25 yrs)  Name:       Pamela Doyle                     Visit Date: 03/06/2021 03:35 pm ---------------------------------------------------------------------- Performed By  Attending:        Lin Landsman      Referred By:      Roxy Cedar                    MD                                       Marrie Chandra  Performed By:     Marcellina Millin          [facility]                                     [facility] at  [facility] for                                                             Women ---------------------------------------------------------------------- Orders  #  Description                           Code        Ordered By  1  US MFM OB FOLLOW UP                   [phone]    Rosana HoesYU FANG ----------------------------------------------------------------------  #  Order #                     Accession #                Episode #  1  [id number]                   [id number]                 [id number] ---------------------------------------------------------------------- Indications  Antenatal follow-up for nonvisualized fetal    Z36.2  anatomy  [redacted] weeks gestation of pregnancy                Z3A.23  Obesity complicating pregnancy, second         O99.212  trimester  Short interval between pregancies, 2nd         O09.892  trimester  Medical complication of pregnancy              O26.90  (Depression) ---------------------------------------------------------------------- Fetal Evaluation  Num Of Fetuses:         1  Fetal Heart Rate(bpm):  147  Cardiac Activity:       Observed  Presentation:           Cephalic   Placenta:               Anterior  P. Cord Insertion:      Previously Visualized  Amniotic Fluid  AFI FV:      Within normal limits ---------------------------------------------------------------------- Biometry  BPD:      57.9  mm     G. Age:  23w 5d         73  %    CI:        77.63   %    70 - 86                                                          FL/HC:      19.9   %    19.2 - 20.8  HC:       208   mm     G. Age:  22w 6d         31  %    HC/AC:      1.05        1.05 - 1.21  AC:       198   mm     G. Age:  24w 3d  84  %    FL/BPD:     71.5   %    71 - 87  FL:       41.4  mm     G. Age:  23w 3d         54  %    FL/AC:      20.9   %    20 - 24  Est. FW:     640  gm      1 lb 7 oz     84  % ---------------------------------------------------------------------- OB History  Gravidity:    2  Living:       1 ---------------------------------------------------------------------- Gestational Age  LMP:           24w 1d        Date:  09/18/20                 EDD:   06/25/21  U/S Today:     23w 4d                                        EDD:   06/29/21  Best:          23w 0d     Det. By:  Previous Ultrasound      EDD:   07/03/21 ---------------------------------------------------------------------- Anatomy  Cranium:               Appears normal         Aortic Arch:            Appears normal  Cavum:                 Appears normal         Ductal Arch:            Appears normal  Ventricles:            Appears normal         Diaphragm:              Appears normal  Choroid Plexus:        Appears normal         Stomach:                Appears normal, left                                                                        sided  Cerebellum:            Appears normal         Abdomen:                Appears normal  Posterior Fossa:       Appears normal         Abdominal Wall:         Previously seen  Nuchal Fold:           Previously seen        Cord Vessels:           Previously seen  Face:  Orbits and  profile     Kidneys:                Appear normal                         previously seen  Lips:                  Appears normal         Bladder:                Appears normal  Thoracic:              Appears normal         Spine:                  Previously seen  Heart:                 Appears normal         Upper Extremities:      Previously seen                         (4CH, axis, and                         situs)  RVOT:                  Appears normal         Lower Extremities:      Previously seen  LVOT:                  Appears normal  Other:  3VV and 3VTV visualized. Hands and feet previously visualized.          Lenses previously visualized. Nasal bone previously visualized. Fetus          appears to be a female. Heels and 5th digit previously visualized. ---------------------------------------------------------------------- Cervix Uterus Adnexa  Cervix  Length:            4.6  cm.  Normal appearance by transabdominal scan. ---------------------------------------------------------------------- Impression  Follow up growth due to complete fetal anatomy and also Ms.  Trotter has a history of thrombocytopenia  Normal interval growth with measurements consistent with  dates  Good fetal movement and amniotic fluid volume  Anatomy completed today. ---------------------------------------------------------------------- Recommendations  Follow up growth as clinically indicated.  Consider repeating CBC at 28-32 weeks. ----------------------------------------------------------------------               Lin Landsman, MD Electronically Signed Final Report   03/06/2021 06:19 pm ----------------------------------------------------------------------   Assessment and Plan:  Pregnancy: G2P1001 at [redacted]w[redacted]d 1. Supervision of other normal pregnancy, antepartum - Routine care - GTT next visit, discussed fasting requirement, duration of appointment  2. [redacted] weeks gestation of pregnancy   Preterm labor symptoms and general  obstetric precautions including but not limited to vaginal bleeding, contractions, leaking of fluid and fetal movement were reviewed in detail with the patient. I discussed the assessment and treatment plan with the patient. The patient was provided an opportunity to ask questions and all were answered. The patient agreed with the plan and demonstrated an understanding of the instructions. The patient was advised to call back or seek an in-person office evaluation/go to MAU at [facility] for any urgent or concerning symptoms. Please refer to After Visit Summary for other counseling recommendations.   I provided  six minutes of face-to-face time during this encounter.  No follow-ups on file.  Future Appointments  Date Time Provider Department Center  04/11/2021  8:50 AM Calvert Cantor, CNM [facility]    Calvert Cantor, CNM Center for [employer], [facility]

## 2021-04-11 ENCOUNTER — Ambulatory Visit (INDEPENDENT_AMBULATORY_CARE_PROVIDER_SITE_OTHER): Payer: Medicaid Other | Admitting: Advanced Practice Midwife

## 2021-04-11 ENCOUNTER — Other Ambulatory Visit: Payer: Self-pay

## 2021-04-11 VITALS — BP 118/77 | HR 89 | Wt 232.8 lb

## 2021-04-11 DIAGNOSIS — O99210 Obesity complicating pregnancy, unspecified trimester: Secondary | ICD-10-CM

## 2021-04-11 DIAGNOSIS — O99340 Other mental disorders complicating pregnancy, unspecified trimester: Secondary | ICD-10-CM

## 2021-04-11 DIAGNOSIS — Z8669 Personal history of other diseases of the nervous system and sense organs: Secondary | ICD-10-CM

## 2021-04-11 DIAGNOSIS — Z23 Encounter for immunization: Secondary | ICD-10-CM

## 2021-04-11 DIAGNOSIS — F32A Depression, unspecified: Secondary | ICD-10-CM

## 2021-04-11 DIAGNOSIS — Z3480 Encounter for supervision of other normal pregnancy, unspecified trimester: Secondary | ICD-10-CM

## 2021-04-11 DIAGNOSIS — Z8759 Personal history of other complications of pregnancy, childbirth and the puerperium: Secondary | ICD-10-CM

## 2021-04-11 DIAGNOSIS — Z3A28 28 weeks gestation of pregnancy: Secondary | ICD-10-CM

## 2021-04-11 NOTE — Patient Instructions (Signed)
Fetal Movement Counts Patient Name: ________________________________________________ Patient Due Date: ____________________  What is a fetal movement count? A fetal movement count is the number of times that you feel your baby move during a certain amount of time. This may also be called a fetal kick count. A fetal movement count is recommended for every pregnant woman. You may be asked to start counting fetal movements as early as week 28 of your pregnancy. Pay attention to when your baby is most active. You may notice your baby's sleep and wake cycles. You may also notice things that make your baby move more. You should do a fetal movement count:  When your baby is normally most active.  At the same time each day. A good time to count movements is while you are resting, after having something to eat and drink. How do I count fetal movements? 1. Find a quiet, comfortable area. Sit, or lie down on your side. 2. Write down the date, the start time and stop time, and the number of movements that you felt between those two times. Take this information with you to your health care visits. 3. Write down your start time when you feel the first movement. 4. Count kicks, flutters, swishes, rolls, and jabs. You should feel at least 10 movements. 5. You may stop counting after you have felt 10 movements, or if you have been counting for 2 hours. Write down the stop time. 6. If you do not feel 10 movements in 2 hours, contact your health care provider for further instructions. Your health care provider may want to do additional tests to assess your baby's well-being. Contact a health care provider if:  You feel fewer than 10 movements in 2 hours.  Your baby is not moving like he or she usually does. Date: ____________ Start time: ____________ Stop time: ____________ Movements: ____________ Date: ____________ Start time: ____________ Stop time: ____________ Movements: ____________ Date: ____________  Start time: ____________ Stop time: ____________ Movements: ____________ Date: ____________ Start time: ____________ Stop time: ____________ Movements: ____________ Date: ____________ Start time: ____________ Stop time: ____________ Movements: ____________ Date: ____________ Start time: ____________ Stop time: ____________ Movements: ____________ Date: ____________ Start time: ____________ Stop time: ____________ Movements: ____________ Date: ____________ Start time: ____________ Stop time: ____________ Movements: ____________ Date: ____________ Start time: ____________ Stop time: ____________ Movements: ____________ This information is not intended to replace advice given to you by your health care provider. Make sure you discuss any questions you have with your health care provider. Document Revised: 06/24/2019 Document Reviewed: 06/24/2019 Elsevier Patient Education  2021 [employer]  

## 2021-04-11 NOTE — Progress Notes (Signed)
   PRENATAL VISIT NOTE  Subjective:  Pamela Doyle is a 25 y.o. G2P1001 at [redacted]w[redacted]d being seen today for ongoing prenatal care.  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  Patient reports no complaints.  Contractions: Not present. Vag. Bleeding: None.  Movement: Present. Denies leaking of fluid.   The following portions of the patient's history were reviewed and updated as appropriate: allergies, current medications, past family history, past medical history, past social history, past surgical history and problem list. Problem list updated.  Objective:   Vitals:   04/11/21 0902  BP: 118/77  Pulse: 89  Weight: 232 lb 12.8 oz (105.6 kg)    Fetal Status: Fetal Heart Rate (bpm): 140 Fundal Height: 27 cm Movement: Present     General:  Alert, oriented and cooperative. Patient is in no acute distress.  Skin: Skin is warm and dry. No rash noted.   Cardiovascular: Normal heart rate noted  Respiratory: Normal respiratory effort, no problems with respiration noted  Abdomen: Soft, gravid, appropriate for gestational age.  Pain/Pressure: Absent     Pelvic: Cervical exam deferred        Extremities: Normal range of motion.     Mental Status: Normal mood and affect. Normal behavior. Normal judgment and thought content.   Assessment and Plan:  Pregnancy: G2P1001 at [redacted]w[redacted]d  1. Supervision of other normal pregnancy, antepartum - Routine care, no complaints, normotensive - CBC - Glucose Tolerance, 2 Hours w/1 Hour - RPR - HIV Antibody (routine testing w rflx) - Tdap vaccine greater than or equal to 7yo IM  2. Depression during pregnancy, antepartum - Patient discontinued Zoloft about ten days ago - Advised two  week postpartum mood check  3. Obesity in pregnancy - TWG 10 lb, FH appropriate  4. [redacted] weeks gestation of pregnancy   5. H/O migraine during pregnancy - Resolved in current pregnancy  Preterm labor symptoms and general obstetric precautions including but not limited to vaginal bleeding, contractions, leaking of fluid and fetal movement were reviewed in detail with the patient. Please refer to After Visit Summary for other counseling recommendations.  Return in about 2 weeks (around 04/25/2021).  Future Appointments  Date Time Provider Department Center  04/24/2021 10:30 AM Kingsley Bing, MD [facility]  05/08/2021 10:30 AM [place] Bing, MD [facility]  05/22/2021 10:30 AM Reva Bores, MD [facility]  06/05/2021 10:30 AM Jolayne Panther, Gigi Gin, MD [facility]  06/12/2021 10:30 AM Federico Flake, MD [facility]    Calvert Cantor, CNM

## 2021-04-12 LAB — CBC
Hematocrit: 34.4 % (ref 34.0–46.6)
Hemoglobin: 11.5 g/dL (ref 11.1–15.9)
MCH: 29.9 pg (ref 26.6–33.0)
MCHC: 33.4 g/dL (ref 31.5–35.7)
MCV: 89 fL (ref 79–97)
Platelets: 138 10*3/uL — ABNORMAL LOW (ref 150–450)
RBC: 3.85 x10E6/uL (ref 3.77–5.28)
RDW: 13.5 % (ref 11.7–15.4)
WBC: 9.4 10*3/uL (ref 3.4–10.8)

## 2021-04-12 LAB — HIV ANTIBODY (ROUTINE TESTING W REFLEX): HIV Screen 4th Generation wRfx: NONREACTIVE

## 2021-04-12 LAB — GLUCOSE TOLERANCE, 2 HOURS W/ 1HR
Glucose, 1 hour: 195 mg/dL — ABNORMAL HIGH (ref 65–179)
Glucose, 2 hour: 95 mg/dL (ref 65–152)
Glucose, Fasting: 83 mg/dL (ref 65–91)

## 2021-04-12 LAB — RPR: RPR Ser Ql: NONREACTIVE

## 2021-04-13 ENCOUNTER — Telehealth: Payer: Self-pay | Admitting: *Deleted

## 2021-04-13 ENCOUNTER — Encounter: Payer: Self-pay | Admitting: Advanced Practice Midwife

## 2021-04-13 ENCOUNTER — Other Ambulatory Visit: Payer: Self-pay | Admitting: *Deleted

## 2021-04-13 DIAGNOSIS — O24410 Gestational diabetes mellitus in pregnancy, diet controlled: Secondary | ICD-10-CM | POA: Insufficient documentation

## 2021-04-13 DIAGNOSIS — O24419 Gestational diabetes mellitus in pregnancy, unspecified control: Secondary | ICD-10-CM | POA: Insufficient documentation

## 2021-04-13 HISTORY — DX: Gestational diabetes mellitus in pregnancy, diet controlled: O24.410

## 2021-04-13 MED ORDER — ACCU-CHEK GUIDE VI STRP: ORAL_STRIP | 12 refills | Status: DC

## 2021-04-13 MED ORDER — ACCU-CHEK GUIDE W/DEVICE KIT: 1.0000 | PACK | Freq: Four times a day (QID) | 0 refills | Status: DC

## 2021-04-13 MED ORDER — ACCU-CHEK SOFTCLIX LANCETS MISC
1.0000 | Freq: Four times a day (QID) | 12 refills | Status: DC
Start: 2021-04-13 — End: 2021-06-20

## 2021-04-13 NOTE — Telephone Encounter (Signed)
Left message for pt to call back  °

## 2021-04-13 NOTE — Telephone Encounter (Signed)
Pt informed of GDM diagnosis. Informed of referral to Diabtes and Nutrition. Supplies sent to pharmacy. Advised to make sure she writes down or keeps track of her blood sugars to go over them with a provider. Informed to check blood sugars fasting and 2 hours after each meal. Pt verbalizes and understands.

## 2021-04-13 NOTE — Telephone Encounter (Signed)
-----   Message from Calvert Cantor, [place] sent at 04/13/2021  8:03 AM EDT ----- Elevated one hour reading with GTT. Please send supplies and schedule with Diabetes Educator. Patient also needs to be scheduled for a growth scan ASAP. Thanks! SW, CNM

## 2021-04-18 ENCOUNTER — Ambulatory Visit: Payer: Medicaid Other | Admitting: Registered"

## 2021-04-18 NOTE — Progress Notes (Signed)
No-showed her diabetes education appointment. Can we please get her rescheduled? Thank you! SW

## 2021-04-20 ENCOUNTER — Ambulatory Visit: Payer: Medicaid Other | Attending: Advanced Practice Midwife

## 2021-04-20 ENCOUNTER — Other Ambulatory Visit: Payer: Self-pay | Admitting: *Deleted

## 2021-04-20 ENCOUNTER — Encounter: Payer: Self-pay | Admitting: *Deleted

## 2021-04-20 ENCOUNTER — Ambulatory Visit: Payer: Medicaid Other | Admitting: *Deleted

## 2021-04-20 ENCOUNTER — Other Ambulatory Visit: Payer: Self-pay

## 2021-04-20 VITALS — BP 114/69 | HR 82

## 2021-04-20 DIAGNOSIS — E669 Obesity, unspecified: Secondary | ICD-10-CM

## 2021-04-20 DIAGNOSIS — D696 Thrombocytopenia, unspecified: Secondary | ICD-10-CM

## 2021-04-20 DIAGNOSIS — O99343 Other mental disorders complicating pregnancy, third trimester: Secondary | ICD-10-CM

## 2021-04-20 DIAGNOSIS — Z3A29 29 weeks gestation of pregnancy: Secondary | ICD-10-CM

## 2021-04-20 DIAGNOSIS — O99113 Other diseases of the blood and blood-forming organs and certain disorders involving the immune mechanism complicating pregnancy, third trimester: Secondary | ICD-10-CM | POA: Diagnosis not present

## 2021-04-20 DIAGNOSIS — O24410 Gestational diabetes mellitus in pregnancy, diet controlled: Secondary | ICD-10-CM | POA: Diagnosis not present

## 2021-04-20 DIAGNOSIS — O09893 Supervision of other high risk pregnancies, third trimester: Secondary | ICD-10-CM | POA: Insufficient documentation

## 2021-04-20 DIAGNOSIS — F329 Major depressive disorder, single episode, unspecified: Secondary | ICD-10-CM

## 2021-04-20 DIAGNOSIS — O99213 Obesity complicating pregnancy, third trimester: Secondary | ICD-10-CM

## 2021-04-20 DIAGNOSIS — O24419 Gestational diabetes mellitus in pregnancy, unspecified control: Secondary | ICD-10-CM | POA: Diagnosis present

## 2021-04-24 ENCOUNTER — Other Ambulatory Visit: Payer: Self-pay

## 2021-04-24 ENCOUNTER — Encounter: Payer: Medicaid Other | Admitting: Registered"

## 2021-04-24 ENCOUNTER — Telehealth (INDEPENDENT_AMBULATORY_CARE_PROVIDER_SITE_OTHER): Payer: Medicaid Other | Admitting: Obstetrics and Gynecology

## 2021-04-24 VITALS — BP 119/81 | HR 84

## 2021-04-24 DIAGNOSIS — O99113 Other diseases of the blood and blood-forming organs and certain disorders involving the immune mechanism complicating pregnancy, third trimester: Secondary | ICD-10-CM

## 2021-04-24 DIAGNOSIS — D696 Thrombocytopenia, unspecified: Secondary | ICD-10-CM

## 2021-04-24 DIAGNOSIS — Z3A30 30 weeks gestation of pregnancy: Secondary | ICD-10-CM

## 2021-04-24 DIAGNOSIS — O24410 Gestational diabetes mellitus in pregnancy, diet controlled: Secondary | ICD-10-CM

## 2021-04-24 DIAGNOSIS — O0993 Supervision of high risk pregnancy, unspecified, third trimester: Secondary | ICD-10-CM

## 2021-04-24 NOTE — Progress Notes (Signed)
TELEHEALTH OBSTETRICS VISIT ENCOUNTER NOTE  Provider location: Center for [facility]   Patient location: Home  I connected with Pamela Doyle on 04/24/21 at 10:30 AM EDT by telephone at home and verified that I am speaking with the correct person using two identifiers. Of note, unable to do video encounter due to technical difficulties.    I discussed the limitations, risks, security and privacy concerns of performing an evaluation and management service by telephone and the availability of in person appointments. I also discussed with the patient that there may be a patient responsible charge related to this service. The patient expressed understanding and agreed to proceed.  Subjective:  Pamela Doyle is a 25 y.o. G2P1001 at [redacted]w[redacted]d being followed for ongoing prenatal care.  She is currently monitored for the following issues for this high-risk pregnancy and has Cystic fibrosis carrier; Gestational thrombocytopenia (HCC); History of gestational hypertension; Supervision of low-risk pregnancy, first trimester; Short interval between pregnancies affecting pregnancy, antepartum; and Gestational diabetes on their problem list.  Patient reports no complaints. Reports fetal movement. Denies any contractions, bleeding or leaking of fluid.   The following portions of the patient's history were reviewed and updated as appropriate: allergies, current medications, past family history, past medical history, past social history, past surgical history and problem list.   Objective:  Blood pressure 119/81, pulse 84, last menstrual period 09/18/2020, currently breastfeeding. General:  Alert, oriented and cooperative.   Mental Status: Normal mood and affect perceived. Normal judgment and thought content.  Rest of physical exam deferred due to type of encounter  Assessment and Plan:  Pregnancy: G2P1001 at [redacted]w[redacted]d 1. [redacted] weeks gestation of pregnancy Ask about birth control at next  visits Pt wondering about waterbirth. She has a certificate from last year (06/2020 SVD). I told her I'll have to ask to see if GDM is an exclusion criteria and if she needs to re-do the class.   2. Supervision of high risk pregnancy in third trimester  3. Benign gestational thrombocytopenia in third trimester [facility]) Repeat platelets late June CBC Latest Ref Rng & Units 04/11/2021 12/06/2020 07/13/2020  WBC 3.4 - 10.8 x10E3/uL 9.4 7.5 17.6(H)  Hemoglobin 11.1 - 15.9 g/dL 92.9 57.4 73.4  Hematocrit 34.0 - 46.6 % 34.4 39.6 37.3  Platelets 150 - 450 x10E3/uL 138(L) 172 122(L)   4. Diet controlled gestational diabetes mellitus (GDM) in third trimester Borderline LGA at last growth u/s on 6/3: efw 1680gm, 89%, ac 92%, bpd 99%, afi 9.7 Last child was 8lbs 5oz. Has rpt growth in early July.  Normal CBG values qid  Preterm labor symptoms and general obstetric precautions including but not limited to vaginal bleeding, contractions, leaking of fluid and fetal movement were reviewed in detail with the patient.  I discussed the assessment and treatment plan with the patient. The patient was provided an opportunity to ask questions and all were answered. The patient agreed with the plan and demonstrated an understanding of the instructions. The patient was advised to call back or seek an in-person office evaluation/go to [facility] at [facility] for any urgent or concerning symptoms. Please refer to After Visit Summary for other counseling recommendations.   I provided 7 minutes of non-face-to-face time during this encounter.  No follow-ups on file.  Future Appointments  Date Time Provider Department Center  04/25/2021 10:15 AM [facility]  05/08/2021 10:30 AM [place] Bing, MD [facility]  05/18/2021 10:00 AM [facility] NURSE [facility]  05/18/2021 10:15 AM [facility] US2 [facility]  05/22/2021 10:30 AM Reva Bores, MD [facility]  06/05/2021 10:30 AM Constant,  Gigi Gin, MD [facility]  06/12/2021 10:30 AM Federico Flake, MD [facility]    [place] Bing, MD Center for [facility], [facility]    ADDENDUM: I talked to [facility] and patient does not need to re-do class since it was in 2021 and a1gdm is not an exclusion criteria for WB.

## 2021-04-25 ENCOUNTER — Telehealth: Payer: Medicaid Other | Admitting: Physician Assistant

## 2021-04-25 ENCOUNTER — Encounter: Payer: Self-pay | Admitting: Physician Assistant

## 2021-04-25 ENCOUNTER — Telehealth (INDEPENDENT_AMBULATORY_CARE_PROVIDER_SITE_OTHER): Payer: Medicaid Other | Admitting: Registered"

## 2021-04-25 ENCOUNTER — Encounter: Payer: Medicaid Other | Attending: Advanced Practice Midwife | Admitting: Registered"

## 2021-04-25 DIAGNOSIS — R109 Unspecified abdominal pain: Secondary | ICD-10-CM

## 2021-04-25 DIAGNOSIS — O24419 Gestational diabetes mellitus in pregnancy, unspecified control: Secondary | ICD-10-CM | POA: Insufficient documentation

## 2021-04-25 DIAGNOSIS — Z3A30 30 weeks gestation of pregnancy: Secondary | ICD-10-CM

## 2021-04-25 DIAGNOSIS — O26893 Other specified pregnancy related conditions, third trimester: Secondary | ICD-10-CM

## 2021-04-25 NOTE — Progress Notes (Signed)
Virtual Visit via Video Note  I connected with Pamela Doyle on 04/25/21 at 10:15 AM EDT by a video enabled telemedicine application and verified that I am speaking with the correct person using two identifiers.  Location: Patient: home Provider: Center for [employer]   I discussed the limitations of evaluation and management by telemedicine and the availability of in person appointments. The patient expressed understanding and agreed to proceed.  History of Present Illness: Patient was seen for Gestational Diabetes self-management. Estimated due date: 07/03/2021; [redacted]w[redacted]d   Observations/Objective: When starting video call pt reports she tried contacting OB clinic but could not get through. Pt states she is worried about severe back pain starting last night, constant since 2 am. Pain described as similar to a period cramp, thick white discharge started, any movement makes it worse, nothing relieves. RD sent message to Clayton Bibles, CNM because that is who patient states she saw recently. Was advised to reach out to clinic for further assessment of symptoms.   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   Pt states she was not able to attend GDM class due to lack of child care.  Assessment and Plan: Medications: reviewed Medical History: reviewed Relevant labs including A1c if available:  A1c 5.2% 12/06/20; OGTT 04/11/21 Physical Activity: past 2 months walking each evening  24 hr Recall: First Meal: plain oatmeal, fruit, water Snack: fruit, nuts Second meal: chicken, rice, vegetables Snack: same Third meal: fish, rice, veggies Snack: none Beverages: 150 oz water  NUTRITION INTERVENTION  Nutrition education (E-1) on the following topics:   Initial Follow-up  [x]  []  Definition of Gestational Diabetes [x]  []  Why dietary management  is important in controlling blood glucose [x]  []  Effects each nutrient has on blood glucose levels [x]  []  Simple carbohydrates vs complex carbohydrates [x]  []  Fluid intake [x]  []  Creating a balanced meal plan [x]  []  Carbohydrate counting  [x]  []  When to check blood glucose levels [x]  []  Proper blood glucose monitoring techniques [x]  []  Effect of stress and stress reduction techniques  [x]  []  Exercise effect on blood glucose levels, appropriate exercise during pregnancy [x]  []  Importance of limiting caffeine and abstaining from alcohol and smoking [x]  []  Medications used for blood sugar control during pregnancy [x]  []  Hypoglycemia and rule of 15  Patient already has a meter, is testing pre breakfast and 2 hours after each meal (after finishing meals) FBS: ~95-98 mg/dL Postprandial: mg/dL  Patient advised to try small balanced snack before bed to address elevated FBS  Patient instructed to monitor glucose levels: FBS: 60 - ? 95 mg/dL (some clinics use 90 for cutoff) 1 hour: ? 140 mg/dL 2 hour: ? mg/dL  Patient received handouts:  Nutrition Diabetes and Pregnancy  Carbohydrate Counting List  Follow Up Instructions: I discussed the assessment and treatment plan with the patient. The patient was provided an opportunity to ask questions and all were answered. The patient agreed with the plan and demonstrated an understanding of the instructions.   The patient was advised to call back or seek an in-person evaluation if the symptoms worsen or if the condition fails to improve as anticipated.  I provided 55 minutes of non-face-to-face time during this encounter.  , RD, CDCES

## 2021-04-25 NOTE — Progress Notes (Signed)
Ms. Pamela, Doyle are scheduled for a virtual visit with your provider today.    Just as we do with appointments in the office, we must obtain your consent to participate.  Your consent will be active for this visit and any virtual visit you may have with Pamela of our providers in the next 365 days.    If you have a MyChart account, I can also send a copy of this consent to you electronically.  All virtual visits are billed to your insurance company just like a traditional visit in the office.  As this is a virtual visit, video technology does not allow for your provider to perform a traditional examination.  This may limit your provider's ability to fully assess your condition.  If your provider identifies any concerns that need to be evaluated in person or the need to arrange testing such as labs, EKG, etc, we will make arrangements to do so.    Although advances in technology are sophisticated, we cannot ensure that it will always work on either your end or our end.  If the connection with a video visit is poor, we may have to switch to a telephone visit.  With either a video or telephone visit, we are not always able to ensure that we have a secure connection.   I need to obtain your verbal consent now.   Are you willing to proceed with your visit today?   Pamela Doyle has provided verbal consent on 04/25/2021 for a virtual visit (video or telephone).   Mar Daring, PA-C 04/25/2021  11:29 AM    MyChart Video Visit    Virtual Visit via Video Note   This visit type was conducted due to national recommendations for restrictions regarding the COVID-19 Pandemic (e.g. social distancing) in an effort to limit this patient's exposure and mitigate transmission in our community. This patient is at least at moderate risk for complications without adequate follow up. This format is felt to be most appropriate for this patient at this time. Physical exam was limited by quality of the video and audio  technology used for the visit.   Patient location: Home Provider location: Home office in [place]  I discussed the limitations of evaluation and management by telemedicine and the availability of in person appointments. The patient expressed understanding and agreed to proceed.  Patient: Pamela Doyle   DOB: [birth year]   25 y.o. Female  MRN: [id number] Visit Date: 04/25/2021  Today's healthcare provider: Mar Daring, PA-C   No chief complaint on file.  Subjective    HPI  Pamela Doyle is a 25 yr old female that is [redacted] weeks gestation with gestational diabetes that presents today via [place] for abdominal pain and vaginal discharge, mucous type consistency. She reports the pain started a few days ago and was more mild and intermittent. At 2am this morning the pain was sharp and awakened her from sleep. Since it has been consistent and sharp. The pain is intense enough she has difficulty moving around or lifting her Pamela Doyle. She has tried to call her GYN without success. She has also spoken to her gestational diabetes doctor and they felt it may have been a yeast infection. She denies fevers, chills, nausea, vomiting or diarrhea.  Patient Active Problem List   Diagnosis Date Noted  . Gestational diabetes 04/13/2021  . Supervision of low-risk pregnancy, first trimester 12/06/2020  . Short interval between pregnancies affecting pregnancy, antepartum 12/06/2020  . History of  gestational hypertension 07/14/2020  . Gestational thrombocytopenia ([place]) 04/15/2020  . Cystic fibrosis carrier 03/12/2020   Past Medical History:  Diagnosis Date  . Biological false positive RPR test 12/23/2019   Negative T. Pallidium Titer 1:1  . Chronic kidney disease    mal rotated kidneys      Medications: Outpatient Medications Prior to Visit  Medication Sig  . Accu-Chek Softclix Lancets lancets 1 each by Other route 4 (four) times daily.  Marland Kitchen acetaminophen (TYLENOL) 325 MG tablet Take 2  tablets (650 mg total) by mouth every 6 (six) hours.  Marland Kitchen aspirin EC 81 MG tablet Take 1 tablet (81 mg total) by mouth daily. Take after 12 weeks for prevention of preeclampsia later in pregnancy  . Blood Glucose Monitoring Suppl (ACCU-CHEK GUIDE) w/Device KIT 1 Device by Does not apply route 4 (four) times daily.  Marland Kitchen glucose blood (ACCU-CHEK GUIDE) test strip Use to check blood sugars four times a day was instructed  . loratadine (CLARITIN) 10 MG tablet Take 1 tablet (10 mg total) by mouth daily.  . Multiple Vitamin (MULTIVITAMIN WITH MINERALS) TABS tablet Take 1 tablet by mouth daily.   No facility-administered medications prior to visit.    Review of Systems  Constitutional: Negative.   Respiratory: Negative.   Cardiovascular: Negative.   Gastrointestinal: Positive for abdominal pain.  Genitourinary: Positive for pelvic pain and vaginal discharge.    Last CBC Lab Results  Component Value Date   WBC 9.4 04/11/2021   HGB 11.5 04/11/2021   HCT 34.4 04/11/2021   MCV 89 04/11/2021   MCH 29.9 04/11/2021   RDW 13.5 04/11/2021   PLT 138 (L) 14/97/0263   Last metabolic panel Lab Results  Component Value Date   GLUCOSE 89 12/06/2020   NA 134 12/06/2020   K 4.1 12/06/2020   CL 102 12/06/2020   CO2 19 (L) 12/06/2020   BUN 7 12/06/2020   CREATININE 0.65 12/06/2020   GFRNONAA 125 12/06/2020   GFRAA 144 12/06/2020   CALCIUM 9.3 12/06/2020   PROT 6.9 12/06/2020   ALBUMIN 4.0 12/06/2020   LABGLOB 2.9 12/06/2020   AGRATIO 1.4 12/06/2020   BILITOT 0.2 12/06/2020   ALKPHOS 79 12/06/2020   AST 11 12/06/2020   ALT 6 12/06/2020   ANIONGAP 11 07/13/2020      Objective    LMP 09/18/2020 (Exact Date)  BP Readings from Last 3 Encounters:  04/24/21 119/81  04/20/21 114/69  04/11/21 118/77   Wt Readings from Last 3 Encounters:  04/11/21 232 lb 12.8 oz (105.6 kg)  01/31/21 217 lb (98.4 kg)  12/06/20 222 lb (100.7 kg)      Physical Exam Vitals reviewed.  Constitutional:       General: She is not in acute distress.    Appearance: Normal appearance. She is well-developed. She is not ill-appearing.  HENT:     Head: Normocephalic and atraumatic.  Pulmonary:     Effort: Pulmonary effort is normal. No respiratory distress.  Musculoskeletal:     Cervical back: Normal range of motion and neck supple.  Neurological:     Mental Status: She is alert.  Psychiatric:        Mood and Affect: Mood normal.        Behavior: Behavior normal.        Thought Content: Thought content normal.        Judgment: Judgment normal.        Assessment & Plan     1. Abdominal pain in pregnancy,  third trimester - Advised patient to seek urgent in-person evaluaiton - She is going to proceed to the triage area with her birthing hospital   No follow-ups on file.     I discussed the assessment and treatment plan with the patient. The patient was provided an opportunity to ask questions and all were answered. The patient agreed with the plan and demonstrated an understanding of the instructions.   The patient was advised to call back or seek an in-person evaluation if the symptoms worsen or if the condition fails to improve as anticipated.  I provided 10 minutes of face-to-face time during this encounter via MyChart Video enabled encounter.   [place] McEwen [phone] (phone) [phone] (fax)  [place]

## 2021-04-26 ENCOUNTER — Inpatient Hospital Stay (HOSPITAL_COMMUNITY)
Admission: AD | Admit: 2021-04-26 | Discharge: 2021-04-27 | Disposition: A | Discharge status: Home / Self Care | Payer: Medicaid Other | Attending: Obstetrics and Gynecology | Admitting: Obstetrics and Gynecology

## 2021-04-26 ENCOUNTER — Encounter (HOSPITAL_COMMUNITY): Payer: Self-pay | Admitting: Obstetrics and Gynecology

## 2021-04-26 ENCOUNTER — Other Ambulatory Visit: Payer: Self-pay

## 2021-04-26 DIAGNOSIS — O26893 Other specified pregnancy related conditions, third trimester: Secondary | ICD-10-CM | POA: Diagnosis not present

## 2021-04-26 DIAGNOSIS — Z3A30 30 weeks gestation of pregnancy: Secondary | ICD-10-CM | POA: Diagnosis not present

## 2021-04-26 DIAGNOSIS — O4703 False labor before 37 completed weeks of gestation, third trimester: Secondary | ICD-10-CM | POA: Diagnosis not present

## 2021-04-26 DIAGNOSIS — O368130 Decreased fetal movements, third trimester, not applicable or unspecified: Secondary | ICD-10-CM | POA: Diagnosis not present

## 2021-04-26 DIAGNOSIS — R1032 Left lower quadrant pain: Secondary | ICD-10-CM | POA: Diagnosis not present

## 2021-04-26 DIAGNOSIS — Z79899 Other long term (current) drug therapy: Secondary | ICD-10-CM | POA: Diagnosis not present

## 2021-04-26 DIAGNOSIS — N858 Other specified noninflammatory disorders of uterus: Secondary | ICD-10-CM | POA: Diagnosis not present

## 2021-04-26 DIAGNOSIS — O4700 False labor before 37 completed weeks of gestation, unspecified trimester: Secondary | ICD-10-CM

## 2021-04-26 LAB — FETAL FIBRONECTIN: Fetal Fibronectin: NEGATIVE

## 2021-04-26 MED ORDER — TRAMADOL HCL 50 MG PO TABS
50.0000 mg | ORAL_TABLET | Freq: Once | ORAL | Status: AC
  Administered 2021-04-26: 50 mg via ORAL
  Filled 2021-04-26: qty 1

## 2021-04-26 NOTE — MAU Note (Signed)
Pt reports decreased fetal movement since 1600. Pt last felt baby move at 1900.   Pt reports lower left abdominal pain for 5 days and today is worse.

## 2021-04-26 NOTE — MAU Provider Note (Signed)
Chief Complaint:  Decreased Fetal Movement   Event Date/Time   First Provider Initiated Contact with Patient 04/26/21 2222     HPI: Pamela Doyle is a 25 y.o. G2P1001 at 28w2dho presents to maternity admissions reporting Decreased fetal movement since this afternoon. Feels movement now. Also c/o sharp intermittent pains in Left Lower abdomen. Comes and goes like contractions . She reports good fetal movement, denies LOF, vaginal bleeding, vaginal itching/burning, urinary symptoms, h/a, dizziness, n/v, diarrhea, constipation or fever/chills.  She denies headache, visual changes or RUQ abdominal pain.  Abdominal Pain This is a new problem. The current episode started in the past 7 days. The onset quality is gradual. The problem has been unchanged. The pain is located in the LLQ. The quality of the pain is cramping. The abdominal pain does not radiate. Pertinent negatives include no anorexia, constipation, diarrhea, dysuria, fever, frequency, myalgias or nausea. Nothing aggravates the pain. The pain is relieved by Nothing.   RN note: Pt reports decreased fetal movement since 1600. Pt last felt baby move at 1900.  Pt reports lower left abdominal pain for 5 days and today is worse  Past Medical History: Past Medical History:  Diagnosis Date   Biological false positive RPR test 12/23/2019   Negative T. Pallidium Titer 1:1   Chronic kidney disease    mal rotated kidneys    Past obstetric history: OB History  Gravida Para Term Preterm AB Living  _0 SAB IAB Ectopic Multiple Live Births        0 1    # Outcome Date GA Lbr Len/2nd Weight Sex Delivery Anes PTL Lv  2 Current           1 Term 07/14/20 474w3d2:10 / 02:11 3785 g M Vag-Spont EPI  LIV     Birth Comments: wnl    Past Surgical History: Past Surgical History:  Procedure Laterality Date   NO PAST SURGERIES      Family History: Family History  Adopted: Yes  Problem Relation Age of Onset   Healthy Mother    Drug abuse  Father    Breast cancer Maternal Grandmother     Social History: Social History   Tobacco Use   Smoking status: Never   Smokeless tobacco: Never  Vaping Use   Vaping Use: Never used  Substance Use Topics   Alcohol use: Yes    Comment: Socially   Drug use: Not Currently    Allergies: No Known Allergies  Meds:  Medications Prior to Admission  Medication Sig Dispense Refill Last Dose   Accu-Chek Softclix Lancets lancets 1 each by Other route 4 (four) times daily. 100 each 12 04/26/2021   aspirin EC 81 MG tablet Take 1 tablet (81 mg total) by mouth daily. Take after 12 weeks for prevention of preeclampsia later in pregnancy 300 tablet 2 04/25/2021   Blood Glucose Monitoring Suppl (ACCU-CHEK GUIDE) w/Device KIT 1 Device by Does not apply route 4 (four) times daily. 1 kit 0 04/26/2021   glucose blood (ACCU-CHEK GUIDE) test strip Use to check blood sugars four times a day was instructed 50 each 12 04/26/2021   acetaminophen (TYLENOL) 325 MG tablet Take 2 tablets (650 mg total) by mouth every 6 (six) hours. 60 tablet 0    loratadine (CLARITIN) 10 MG tablet Take 1 tablet (10 mg total) by mouth daily. 30 tablet 3    Multiple Vitamin (MULTIVITAMIN WITH MINERALS) TABS tablet Take 1 tablet by  mouth daily.       I have reviewed patient's Past Medical Hx, Surgical Hx, Family Hx, Social Hx, medications and allergies.   ROS:  Review of Systems  Constitutional:  Negative for fever.  Gastrointestinal:  Positive for abdominal pain. Negative for anorexia, constipation, diarrhea and nausea.  Genitourinary:  Negative for dysuria and frequency.  Musculoskeletal:  Negative for myalgias.  Other systems negative  Physical Exam  Patient Vitals for the past 24 hrs:  BP Temp Pulse Resp SpO2 Weight  04/26/21 2154 138/72 -- 86 -- -- --  04/26/21 2152 -- 98.2 F (36.8 C) -- 12 100 % 105.2 kg   Constitutional: Well-developed, well-nourished female in no acute distress.  Cardiovascular: normal rate and  rhythm Respiratory: normal effort, clear to auscultation bilaterally GI: Abd soft, non-tender, gravid appropriate for gestational age.   No rebound or guarding. MS: Extremities nontender, no edema, normal ROM Neurologic: Alert and oriented x 4.  GU: Neg CVAT.  PELVIC EXAM:   Dilation: 1 Effacement (%): Thick Exam by:: Marie Williams, CNM  FHT:  Baseline 140 , moderate variability, accelerations present, no decelerations Contractions: q 3-4 mins Irregular     Labs: Results for orders placed or performed during the hospital encounter of 04/26/21 (from the past 24 hour(s))  Fetal fibronectin     Status: None   Collection Time: 04/26/21 10:34 PM  Result Value Ref Range   Fetal Fibronectin NEGATIVE NEGATIVE    B/Positive/-- (01/19 1058)  Imaging:    MAU Course/MDM: I have ordered labs and reviewed results. Fetal fibronectin is negative NST reviewed, reactive with intermittent mild conactions which stopped after giving Tramadol   Treatments in MAU included EFM, Tramadol for pain Unclear if LLQ pain is ligament pain or the mild contracitons.  Discussed FFn is reassuring in light of irregular mild contractions Recommend increased hydration. .    Assessment: Single IUP at [redacted]w[redacted]d Left Lower quadrant pain, unclear source Uterine irritability with negative fetal fibronectin 1cm dilation but no effacement  Plan: Discharge home Preterm Labor precautions and fetal kick counts PO hydration Follow up in Office for prenatal visits  Encouraged to return if she develops worsening of symptoms, increase in pain, fever, or other concerning symptoms.  Pt stable at time of discharge.  Marie Williams CNM, MSN Certified Nurse-Midwife 04/26/2021 10:22 PM 

## 2021-05-01 ENCOUNTER — Other Ambulatory Visit: Payer: Self-pay

## 2021-05-08 ENCOUNTER — Other Ambulatory Visit: Payer: Self-pay

## 2021-05-08 ENCOUNTER — Ambulatory Visit (INDEPENDENT_AMBULATORY_CARE_PROVIDER_SITE_OTHER): Payer: Medicaid Other | Admitting: Obstetrics and Gynecology

## 2021-05-08 VITALS — BP 111/72 | HR 84 | Wt 229.4 lb

## 2021-05-08 DIAGNOSIS — D696 Thrombocytopenia, unspecified: Secondary | ICD-10-CM

## 2021-05-08 DIAGNOSIS — Z3A32 32 weeks gestation of pregnancy: Secondary | ICD-10-CM

## 2021-05-08 DIAGNOSIS — O24410 Gestational diabetes mellitus in pregnancy, diet controlled: Secondary | ICD-10-CM

## 2021-05-08 DIAGNOSIS — O09899 Supervision of other high risk pregnancies, unspecified trimester: Secondary | ICD-10-CM

## 2021-05-08 DIAGNOSIS — O99113 Other diseases of the blood and blood-forming organs and certain disorders involving the immune mechanism complicating pregnancy, third trimester: Secondary | ICD-10-CM

## 2021-05-08 DIAGNOSIS — Z8759 Personal history of other complications of pregnancy, childbirth and the puerperium: Secondary | ICD-10-CM

## 2021-05-08 LAB — CBC
Hematocrit: 33.8 % — ABNORMAL LOW (ref 34.0–46.6)
Hemoglobin: 11.2 g/dL (ref 11.1–15.9)
MCH: 28.4 pg (ref 26.6–33.0)
MCHC: 33.1 g/dL (ref 31.5–35.7)
MCV: 86 fL (ref 79–97)
Platelets: 116 10*3/uL — ABNORMAL LOW (ref 150–450)
RBC: 3.95 x10E6/uL (ref 3.77–5.28)
RDW: 13.1 % (ref 11.7–15.4)
WBC: 8 10*3/uL (ref 3.4–10.8)

## 2021-05-08 NOTE — Progress Notes (Signed)
   PRENATAL VISIT NOTE  Subjective:  Pamela Doyle is a 25 y.o. G2P1001 at [redacted]w[redacted]d being seen today for ongoing prenatal care.  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  Patient reports occasional contractions.  Contractions: Not present. Vag. Bleeding: None.  Movement: Present. Denies leaking of fluid.   The following portions of the patient's history were reviewed and updated as appropriate: allergies, current medications, past family history, past medical history, past social history, past surgical history and problem list.   Objective:   Vitals:   05/08/21 1014  BP: 111/72  Pulse: 84  Weight: 229 lb 6.4 oz (104.1 kg)    Fetal Status: Fetal Heart Rate (bpm): 141   Movement: Present     General:  Alert, oriented and cooperative. Patient is in no acute distress.  Skin: Skin is warm and dry. No rash noted.   Cardiovascular: Normal heart rate noted  Respiratory: Normal respiratory effort, no problems with respiration noted  Abdomen: Soft, gravid, appropriate for gestational age.  Pain/Pressure: Present     Pelvic: 1/long/high     Extremities: Normal range of motion.     Mental Status: Normal mood and affect. Normal behavior. Normal judgment and thought content.   Assessment and Plan:  Pregnancy: G2P1001 at [redacted]w[redacted]d 1. [redacted] weeks gestation of pregnancy  2. Benign gestational thrombocytopenia in third trimester [facility]) Recheck today CBC Latest Ref Rng & Units 04/11/2021 12/06/2020 07/13/2020  WBC 3.4 - 10.8 x10E3/uL 9.4 7.5 17.6(H)  Hemoglobin 11.1 - 15.9 g/dL 25.9 56.3 87.5  Hematocrit 34.0 - 46.6 % 34.4 39.6 37.3  Platelets 150 - 450 x10E3/uL 138(L) 172 122(L)   - CBC  3. History of gestational hypertension Continue low dose asa. D/c if plts dropping   4. Short interval between pregnancies affecting pregnancy, antepartum  5. GDM (gestational diabetes mellitus), class A1 Has 7/1 rpt growth u/s. Normal CBG log. D/w her re: shoulder dystocia risk.  Borderline LGA at last growth u/s on 6/3: efw 1680gm, 89%, ac 92%, bpd 99%, afi 9.7 Last child was 8lbs 5oz.  Preterm labor symptoms and general obstetric precautions including but not limited to vaginal bleeding, contractions, leaking of fluid and fetal movement were reviewed in detail with the patient. Please refer to After Visit Summary for other counseling recommendations.   No follow-ups on file.  Future Appointments  Date Time Provider Department Center  05/18/2021 10:00 AM [facility] NURSE [facility]  05/18/2021 10:15 AM [facility] US2 [facility]  05/22/2021 10:30 AM Reva Bores, MD [facility]  06/05/2021 10:30 AM Constant, Gigi Gin, MD [facility]  06/12/2021 10:30 AM Federico Flake, MD [facility]    [place] Bing, MD

## 2021-05-18 ENCOUNTER — Encounter: Payer: Self-pay | Admitting: *Deleted

## 2021-05-18 ENCOUNTER — Ambulatory Visit: Payer: Medicaid Other | Admitting: *Deleted

## 2021-05-18 ENCOUNTER — Other Ambulatory Visit: Payer: Self-pay

## 2021-05-18 ENCOUNTER — Ambulatory Visit: Payer: Medicaid Other | Attending: Obstetrics and Gynecology

## 2021-05-18 VITALS — BP 129/74 | HR 78

## 2021-05-18 DIAGNOSIS — E669 Obesity, unspecified: Secondary | ICD-10-CM

## 2021-05-18 DIAGNOSIS — O24410 Gestational diabetes mellitus in pregnancy, diet controlled: Secondary | ICD-10-CM | POA: Insufficient documentation

## 2021-05-18 DIAGNOSIS — Z362 Encounter for other antenatal screening follow-up: Secondary | ICD-10-CM

## 2021-05-18 DIAGNOSIS — O99343 Other mental disorders complicating pregnancy, third trimester: Secondary | ICD-10-CM

## 2021-05-18 DIAGNOSIS — O09893 Supervision of other high risk pregnancies, third trimester: Secondary | ICD-10-CM

## 2021-05-18 DIAGNOSIS — D696 Thrombocytopenia, unspecified: Secondary | ICD-10-CM

## 2021-05-18 DIAGNOSIS — O99113 Other diseases of the blood and blood-forming organs and certain disorders involving the immune mechanism complicating pregnancy, third trimester: Secondary | ICD-10-CM

## 2021-05-18 DIAGNOSIS — O99213 Obesity complicating pregnancy, third trimester: Secondary | ICD-10-CM

## 2021-05-18 DIAGNOSIS — Z3A33 33 weeks gestation of pregnancy: Secondary | ICD-10-CM

## 2021-05-18 DIAGNOSIS — F32A Depression, unspecified: Secondary | ICD-10-CM

## 2021-05-22 ENCOUNTER — Other Ambulatory Visit: Payer: Self-pay | Admitting: *Deleted

## 2021-05-22 ENCOUNTER — Other Ambulatory Visit: Payer: Self-pay

## 2021-05-22 ENCOUNTER — Telehealth (INDEPENDENT_AMBULATORY_CARE_PROVIDER_SITE_OTHER): Payer: Medicaid Other | Admitting: Family Medicine

## 2021-05-22 VITALS — BP 123/67 | HR 71

## 2021-05-22 DIAGNOSIS — Z8759 Personal history of other complications of pregnancy, childbirth and the puerperium: Secondary | ICD-10-CM

## 2021-05-22 DIAGNOSIS — O0990 Supervision of high risk pregnancy, unspecified, unspecified trimester: Secondary | ICD-10-CM

## 2021-05-22 DIAGNOSIS — Z3A34 34 weeks gestation of pregnancy: Secondary | ICD-10-CM

## 2021-05-22 DIAGNOSIS — O24410 Gestational diabetes mellitus in pregnancy, diet controlled: Secondary | ICD-10-CM

## 2021-05-22 DIAGNOSIS — O09293 Supervision of pregnancy with other poor reproductive or obstetric history, third trimester: Secondary | ICD-10-CM

## 2021-05-22 DIAGNOSIS — D696 Thrombocytopenia, unspecified: Secondary | ICD-10-CM

## 2021-05-22 DIAGNOSIS — O99113 Other diseases of the blood and blood-forming organs and certain disorders involving the immune mechanism complicating pregnancy, third trimester: Secondary | ICD-10-CM

## 2021-05-22 NOTE — Progress Notes (Signed)
I connected with  Pamela Doyle on 05/22/21 at 10:30 AM EDT by telephone and verified that I am speaking with the correct person using two identifiers.   I discussed the limitations, risks, security and privacy concerns of performing an evaluation and management service by telephone and the availability of in person appointments. I also discussed with the patient that there may be a patient responsible charge related to this service. The patient expressed understanding and agreed to proceed.  Scheryl Marten, RN 05/22/2021  10:50 AM    Discuss last growth scan

## 2021-05-22 NOTE — Progress Notes (Signed)
OBSTETRICS PRENATAL VIRTUAL VISIT ENCOUNTER NOTE  Provider location: Center for [facility]   Patient location: Home  I connected with Pamela Doyle on 05/22/21 at 10:30 AM EDT by MyChart Video Encounter and verified that I am speaking with the correct person using two identifiers. I discussed the limitations, risks, security and privacy concerns of performing an evaluation and management service virtually and the availability of in person appointments. I also discussed with the patient that there may be a patient responsible charge related to this service. The patient expressed understanding and agreed to proceed. Subjective:  Pamela Doyle is a 25 y.o. G2P1001 at [redacted]w[redacted]d being seen today for ongoing prenatal care.  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  Patient reports no complaints.  Contractions: Irritability. Vag. Bleeding: None.  Movement: Present. Denies any leaking of fluid.   The following portions of the patient's history were reviewed and updated as appropriate: allergies, current medications, past family history, past medical history, past social history, past surgical history and problem list.   Objective:   Vitals:   05/22/21 1049  BP: 123/67  Pulse: 71    Fetal Status:     Movement: Present     General:  Alert, oriented and cooperative. Patient is in no acute distress.  Respiratory: Normal respiratory effort, no problems with respiration noted  Mental Status: Normal mood and affect. Normal behavior. Normal judgment and thought content.  Rest of physical exam deferred due to type of encounter  Imaging: [place] MFM OB FOLLOW  UP  Result Date: 05/18/2021 ----------------------------------------------------------------------  OBSTETRICS REPORT                       (Signed Final 05/18/2021 11:52 am) ---------------------------------------------------------------------- Patient Info  ID #:       [id number]                          D.O.B.:  [birth year] (25 yrs)  Name:       Pamela Doyle                     Visit Date: 05/18/2021 10:08 am ---------------------------------------------------------------------- Performed By  Attending:        Ma Rings MD         Referred By:      Calvert Cantor  Performed By:     Emeline Darling BS,      [facility] ---------------------------------------------------------------------- Orders  #  Description                           Code        Ordered By  1  US MFM OB FOLLOW UP                   [id number]    Rosana HoesYU FANG ----------------------------------------------------------------------  #  Order #                     Accession #                Episode #  1  [id number]                   [id number]                 [id number] ---------------------------------------------------------------------- Indications  Gestational diabetes in pregnancy, diet        O24.410  controlled  [redacted] weeks gestation of pregnancy                Z3A.33  Thrombocytopenia affecting pregnancy,          O99.119, D69.6  antepartum  Medical complication of pregnancy              O26.90  (Depression)  Obesity complicating pregnancy, third          O99.213  trimester  Short interval between pregancies, 3rd         O09.893  trimester  Encounter for other antenatal screening        Z36.2  follow-up  ---------------------------------------------------------------------- Fetal Evaluation  Num Of Fetuses:         1  Fetal Heart Rate(bpm):  132  Cardiac Activity:       Observed  Presentation:           Cephalic  Placenta:               Anterior  P. Cord Insertion:      Previously Visualized  Amniotic Fluid  AFI FV:      Within normal limits  AFI Sum(cm)     %Tile       Largest Pocket(cm)  10.8            24          4.2  RUQ(cm)       RLQ(cm)       LUQ(cm)        LLQ(cm)  3             1.5           2.1            4.2 ---------------------------------------------------------------------- Biometry  BPD:        87  mm     G. Age:  35w 1d         88  %    CI:        77.74   %    70 - 86  FL/HC:      21.3   %    19.9 - 21.5  HC:      312.3  mm     G. Age:  35w 0d         53  %    HC/AC:      0.99        0.96 - 1.11  AC:      314.6  mm     G. Age:  35w 3d         94  %    FL/BPD:     76.6   %    71 - 87  FL:       66.6  mm     G. Age:  34w 2d         62  %    FL/AC:      21.2   %    20 - 24  Est. FW:    2573  gm    5 lb 11 oz      87  % ---------------------------------------------------------------------- OB History  Gravidity:    2  Living:       1 ---------------------------------------------------------------------- Gestational Age  LMP:           34w 4d        Date:  09/18/20                 EDD:   06/25/21  U/S Today:     35w 0d                                        EDD:   06/22/21  Best:          33w 3d     Det. By:  Previous Ultrasound      EDD:   07/03/21 ---------------------------------------------------------------------- Anatomy  Cranium:               Appears normal         Aortic Arch:            Previously seen  Cavum:                 Previously seen        Ductal Arch:            Previously seen  Ventricles:            Appears normal         Diaphragm:              Appears normal  Choroid Plexus:        Previously seen        Stomach:                 Appears normal, left                                                                        sided  Cerebellum:            Previously seen        Abdomen:  Appears normal  Posterior Fossa:       Previously seen        Abdominal Wall:         Previously seen  Nuchal Fold:           Previously seen        Cord Vessels:           Previously seen  Face:                  Orbits and profile     Kidneys:                Appear normal                         previously seen  Lips:                  Previously seen        Bladder:                Appears normal  Thoracic:              Previously seen        Spine:                  Previously seen  Heart:                 Appears normal         Upper Extremities:      Previously seen                         (4CH, axis, and                         situs)  RVOT:                  Previously seen        Lower Extremities:      Previously seen  LVOT:                  Previously seen  Other:  3VV, 3VTV,  Hands, feet, Lenses, Nasal bone, Heels and 5th digit          previously visualized. Fetus appears to be a female. ---------------------------------------------------------------------- Cervix Uterus Adnexa  Cervix  Not visualized (advanced GA >24wks) ---------------------------------------------------------------------- Comments  This patient was seen for a follow up growth scan due to diet-  controlled gestational diabetes.  She reports that her  fingerstick values have been within normal limits.  She was informed that the fetal growth and amniotic fluid  level appears appropriate for her gestational age.  A follow up exam was scheduled in 4 weeks to assess the  fetal growth closer to delivery.  Weekly fetal testing would be indicated should she require  insulin or metformin for treatment of gestational diabetes. ----------------------------------------------------------------------                   Ma Rings, MD Electronically Signed Final Report   05/18/2021 11:52  am ----------------------------------------------------------------------   Assessment and Plan:  Pregnancy: G2P1001 at [redacted]w[redacted]d 1. GDM (gestational diabetes mellitus), class A1 Last growth, vtx, 5 lb 11 oz, 87% Reports CBGs are all in range--Fastings < 95, 2 hour pp < 100  2. Benign gestational thrombocytopenia in third trimester [facility]) Consider repeat next visit  3. Supervision of high risk pregnancy,  antepartum Continue prenatal care.  4. History of gestational hypertension BP is well controlled. On ASA  Preterm labor symptoms and general obstetric precautions including but not limited to vaginal bleeding, contractions, leaking of fluid and fetal movement were reviewed in detail with the patient. I discussed the assessment and treatment plan with the patient. The patient was provided an opportunity to ask questions and all were answered. The patient agreed with the plan and demonstrated an understanding of the instructions. The patient was advised to call back or seek an in-person office evaluation/go to MAU at [facility] for any urgent or concerning symptoms. Please refer to After Visit Summary for other counseling recommendations.   I provided 16 minutes of face-to-face time during this encounter.  Return in about 2 weeks (around 06/05/2021) for [facility].  Future Appointments  Date Time Provider Department Center  06/05/2021 10:30 AM Constant, Gigi Gin, MD [facility]  06/12/2021 10:30 AM Federico Flake, MD [facility]  06/15/2021  9:30 AM [facility] NURSE [facility]  06/15/2021  9:45 AM [facility] US6 [facility]  06/19/2021 10:35 AM [place] Bing, MD [facility]  06/26/2021 11:15 AM Federico Flake, MD [facility]    Reva Bores, MD Center for [facility], [facility]

## 2021-06-05 ENCOUNTER — Ambulatory Visit (INDEPENDENT_AMBULATORY_CARE_PROVIDER_SITE_OTHER): Payer: Medicaid Other | Admitting: Obstetrics and Gynecology

## 2021-06-05 ENCOUNTER — Other Ambulatory Visit (HOSPITAL_COMMUNITY)
Admission: RE | Admit: 2021-06-05 | Discharge: 2021-06-05 | Disposition: A | Discharge status: Home / Self Care | Payer: Medicaid Other | Source: Ambulatory Visit | Attending: Obstetrics and Gynecology | Admitting: Obstetrics and Gynecology

## 2021-06-05 ENCOUNTER — Encounter: Payer: Self-pay | Admitting: Obstetrics and Gynecology

## 2021-06-05 ENCOUNTER — Other Ambulatory Visit: Payer: Self-pay

## 2021-06-05 VITALS — BP 139/80 | HR 84 | Wt 236.4 lb

## 2021-06-05 DIAGNOSIS — O0990 Supervision of high risk pregnancy, unspecified, unspecified trimester: Secondary | ICD-10-CM | POA: Diagnosis not present

## 2021-06-05 DIAGNOSIS — Z8759 Personal history of other complications of pregnancy, childbirth and the puerperium: Secondary | ICD-10-CM

## 2021-06-05 DIAGNOSIS — O09899 Supervision of other high risk pregnancies, unspecified trimester: Secondary | ICD-10-CM

## 2021-06-05 DIAGNOSIS — O99113 Other diseases of the blood and blood-forming organs and certain disorders involving the immune mechanism complicating pregnancy, third trimester: Secondary | ICD-10-CM

## 2021-06-05 DIAGNOSIS — D696 Thrombocytopenia, unspecified: Secondary | ICD-10-CM

## 2021-06-05 DIAGNOSIS — O24410 Gestational diabetes mellitus in pregnancy, diet controlled: Secondary | ICD-10-CM

## 2021-06-05 NOTE — Progress Notes (Signed)
   PRENATAL VISIT NOTE  Subjective:  Pamela Doyle is a 25 y.o. G2P1001 at [redacted]w[redacted]d being seen today for ongoing prenatal care.  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  Patient reports no complaints.  Contractions: Irritability. Vag. Bleeding: None.  Movement: Present. Denies leaking of fluid.   The following portions of the patient's history were reviewed and updated as appropriate: allergies, current medications, past family history, past medical history, past social history, past surgical history and problem list.   Objective:   Vitals:   06/05/21 1032  BP: 139/80  Pulse: 84  Weight: 236 lb 6.4 oz (107.2 kg)    Fetal Status: Fetal Heart Rate (bpm): 158   Movement: Present     General:  Alert, oriented and cooperative. Patient is in no acute distress.  Skin: Skin is warm and dry. No rash noted.   Cardiovascular: Normal heart rate noted  Respiratory: Normal respiratory effort, no problems with respiration noted  Abdomen: Soft, gravid, appropriate for gestational age.  Pain/Pressure: Present     Pelvic: Cervical exam performed in the presence of a chaperone        Extremities: Normal range of motion.     Mental Status: Normal mood and affect. Normal behavior. Normal judgment and thought content.   Assessment and Plan:  Pregnancy: G2P1001 at [redacted]w[redacted]d 1. Supervision of high risk pregnancy, antepartum Patient is doing well Cultures today  2. GDM (gestational diabetes mellitus), class A1 CBGs reviewed and all within range Normal growth ultrasound on 7/1. Follow up growth ultrasound scheduled 7/29 Discussed IOL at 40 weeks  3. Benign gestational  thrombocytopenia in third trimester (HCC) Repeat CBC next visit   4. History of gestational hypertension BP stable Precautions reviewed  5. Short interval between pregnancies affecting pregnancy, antepartum   Preterm labor symptoms and general obstetric precautions including but not limited to vaginal bleeding, contractions, leaking of fluid and fetal movement were reviewed in detail with the patient. Please refer to After Visit Summary for other counseling recommendations.   No follow-ups on file.  Future Appointments  Date Time Provider Department Center  06/12/2021 10:30 AM Federico Flake, MD [facility]  06/15/2021  9:30 AM [facility] NURSE [facility]  06/15/2021  9:45 AM [facility] US6 [facility]  06/19/2021 10:35 AM Alma Bing, MD [facility]  06/26/2021 11:15 AM Federico Flake, MD [facility]    Catalina Antigua, MD

## 2021-06-06 LAB — CERVICOVAGINAL ANCILLARY ONLY
Chlamydia: NEGATIVE
Comment: NEGATIVE
Comment: NORMAL
Neisseria Gonorrhea: NEGATIVE

## 2021-06-09 LAB — CULTURE, BETA STREP (GROUP B ONLY): Strep Gp B Culture: NEGATIVE

## 2021-06-12 ENCOUNTER — Other Ambulatory Visit: Payer: Self-pay

## 2021-06-12 ENCOUNTER — Ambulatory Visit (INDEPENDENT_AMBULATORY_CARE_PROVIDER_SITE_OTHER): Payer: Medicaid Other | Admitting: Family Medicine

## 2021-06-12 VITALS — BP 115/79 | HR 87 | Wt 234.2 lb

## 2021-06-12 DIAGNOSIS — O24410 Gestational diabetes mellitus in pregnancy, diet controlled: Secondary | ICD-10-CM

## 2021-06-12 DIAGNOSIS — O0990 Supervision of high risk pregnancy, unspecified, unspecified trimester: Secondary | ICD-10-CM

## 2021-06-12 DIAGNOSIS — O09899 Supervision of other high risk pregnancies, unspecified trimester: Secondary | ICD-10-CM

## 2021-06-12 DIAGNOSIS — O99113 Other diseases of the blood and blood-forming organs and certain disorders involving the immune mechanism complicating pregnancy, third trimester: Secondary | ICD-10-CM

## 2021-06-12 DIAGNOSIS — D696 Thrombocytopenia, unspecified: Secondary | ICD-10-CM

## 2021-06-12 DIAGNOSIS — Z8759 Personal history of other complications of pregnancy, childbirth and the puerperium: Secondary | ICD-10-CM

## 2021-06-12 LAB — CBC
Hematocrit: 33.5 % — ABNORMAL LOW (ref 34.0–46.6)
Hemoglobin: 10.9 g/dL — ABNORMAL LOW (ref 11.1–15.9)
MCH: 27.3 pg (ref 26.6–33.0)
MCHC: 32.5 g/dL (ref 31.5–35.7)
MCV: 84 fL (ref 79–97)
Platelets: 111 10*3/uL — ABNORMAL LOW (ref 150–450)
RBC: 4 x10E6/uL (ref 3.77–5.28)
RDW: 13.7 % (ref 11.7–15.4)
WBC: 8 10*3/uL (ref 3.4–10.8)

## 2021-06-12 NOTE — Progress Notes (Signed)
   PRENATAL VISIT NOTE  Subjective:  Pamela Doyle is a 25 y.o. G2P1001 at [redacted]w[redacted]d being seen today for ongoing prenatal care.  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  Patient reports no complaints.  Contractions: Irritability. Vag. Bleeding: None.  Movement: Present. Denies leaking of fluid.   The following portions of the patient's history were reviewed and updated as appropriate: allergies, current medications, past family history, past medical history, past social history, past surgical history and problem list.   Objective:   Vitals:   06/12/21 1057  BP: 115/79  Pulse: 87  Weight: 234 lb 3.2 oz (106.2 kg)    Fetal Status: Fetal Heart Rate (bpm): 152 Fundal Height: 36 cm Movement: Present  Presentation: Vertex  General:  Alert, oriented and cooperative. Patient is in no acute distress.  Skin: Skin is warm and dry. No rash noted.   Cardiovascular: Normal heart rate noted  Respiratory: Normal respiratory effort, no problems with respiration noted  Abdomen: Soft, gravid, appropriate for gestational age.  Pain/Pressure: Present     Pelvic: Cervical exam deferred Dilation: 2 Effacement (%): Thick Station: Ballotable  Extremities: Normal range of motion.     Mental Status: Normal mood and affect. Normal behavior. Normal judgment and thought content.   Assessment and Plan:  Pregnancy: G2P1001 at [redacted]w[redacted]d  1. GDM (gestational diabetes mellitus), class A1 Reviewed BG which are WNL EFW @35 = 2573g 87th%  2. Supervision of high risk pregnancy, antepartum Up to date  3. Short interval between pregnancies affecting pregnancy, antepartum Current son is 78 month  old  4. History of gestational hypertension BP WNL  5. Benign gestational thrombocytopenia in third trimester (HCC) - CBC  Term labor symptoms and general obstetric precautions including but not limited to vaginal bleeding, contractions, leaking of fluid and fetal movement were reviewed in detail with the patient. Please refer to After Visit Summary for other counseling recommendations.   Return in about 1 week (around 06/19/2021) for Routine prenatal care, scheduled visit.  Future Appointments  Date Time Provider Department Center  06/15/2021  9:30 AM [facility] NURSE [facility]  06/15/2021  9:45 AM [facility] US6 [facility]  06/19/2021 10:35 AM 08/19/2021, MD [facility]  06/26/2021 11:15 AM 08/26/2021, MD [facility]  06/27/2021  6:30 AM [facility] SCHED ROOM [facility] None    08/27/2021, MD

## 2021-06-15 ENCOUNTER — Ambulatory Visit: Payer: Medicaid Other | Admitting: *Deleted

## 2021-06-15 ENCOUNTER — Other Ambulatory Visit: Payer: Self-pay

## 2021-06-15 ENCOUNTER — Ambulatory Visit: Payer: Medicaid Other | Attending: Obstetrics

## 2021-06-15 VITALS — BP 126/72 | HR 87

## 2021-06-15 DIAGNOSIS — O99113 Other diseases of the blood and blood-forming organs and certain disorders involving the immune mechanism complicating pregnancy, third trimester: Secondary | ICD-10-CM | POA: Diagnosis not present

## 2021-06-15 DIAGNOSIS — O99213 Obesity complicating pregnancy, third trimester: Secondary | ICD-10-CM

## 2021-06-15 DIAGNOSIS — Z362 Encounter for other antenatal screening follow-up: Secondary | ICD-10-CM | POA: Diagnosis not present

## 2021-06-15 DIAGNOSIS — O09893 Supervision of other high risk pregnancies, third trimester: Secondary | ICD-10-CM

## 2021-06-15 DIAGNOSIS — O24410 Gestational diabetes mellitus in pregnancy, diet controlled: Secondary | ICD-10-CM | POA: Insufficient documentation

## 2021-06-15 DIAGNOSIS — Z3A37 37 weeks gestation of pregnancy: Secondary | ICD-10-CM

## 2021-06-15 DIAGNOSIS — D696 Thrombocytopenia, unspecified: Secondary | ICD-10-CM

## 2021-06-15 DIAGNOSIS — F32A Depression, unspecified: Secondary | ICD-10-CM

## 2021-06-15 DIAGNOSIS — O99343 Other mental disorders complicating pregnancy, third trimester: Secondary | ICD-10-CM

## 2021-06-15 DIAGNOSIS — E669 Obesity, unspecified: Secondary | ICD-10-CM

## 2021-06-18 ENCOUNTER — Inpatient Hospital Stay (HOSPITAL_COMMUNITY): Payer: Medicaid Other | Admitting: Anesthesiology

## 2021-06-18 ENCOUNTER — Encounter (HOSPITAL_COMMUNITY): Payer: Self-pay | Admitting: Obstetrics and Gynecology

## 2021-06-18 ENCOUNTER — Telehealth: Payer: Self-pay | Admitting: *Deleted

## 2021-06-18 ENCOUNTER — Inpatient Hospital Stay (HOSPITAL_COMMUNITY)
Admission: AD | Admit: 2021-06-18 | Discharge: 2021-06-20 | DRG: 806 | Disposition: A | Discharge status: Home / Self Care | Payer: Medicaid Other | Attending: Obstetrics & Gynecology | Admitting: Obstetrics & Gynecology

## 2021-06-18 ENCOUNTER — Other Ambulatory Visit: Payer: Self-pay

## 2021-06-18 DIAGNOSIS — O99214 Obesity complicating childbirth: Secondary | ICD-10-CM | POA: Diagnosis present

## 2021-06-18 DIAGNOSIS — D696 Thrombocytopenia, unspecified: Secondary | ICD-10-CM | POA: Diagnosis present

## 2021-06-18 DIAGNOSIS — D6959 Other secondary thrombocytopenia: Secondary | ICD-10-CM | POA: Diagnosis present

## 2021-06-18 DIAGNOSIS — O9912 Other diseases of the blood and blood-forming organs and certain disorders involving the immune mechanism complicating childbirth: Secondary | ICD-10-CM | POA: Diagnosis present

## 2021-06-18 DIAGNOSIS — O24420 Gestational diabetes mellitus in childbirth, diet controlled: Principal | ICD-10-CM | POA: Diagnosis present

## 2021-06-18 DIAGNOSIS — Z20822 Contact with and (suspected) exposure to covid-19: Secondary | ICD-10-CM | POA: Diagnosis present

## 2021-06-18 DIAGNOSIS — O09899 Supervision of other high risk pregnancies, unspecified trimester: Secondary | ICD-10-CM

## 2021-06-18 DIAGNOSIS — Z3A37 37 weeks gestation of pregnancy: Secondary | ICD-10-CM | POA: Diagnosis not present

## 2021-06-18 DIAGNOSIS — Z141 Cystic fibrosis carrier: Secondary | ICD-10-CM

## 2021-06-18 DIAGNOSIS — O4202 Full-term premature rupture of membranes, onset of labor within 24 hours of rupture: Secondary | ICD-10-CM | POA: Diagnosis not present

## 2021-06-18 DIAGNOSIS — Z3A38 38 weeks gestation of pregnancy: Secondary | ICD-10-CM | POA: Diagnosis not present

## 2021-06-18 DIAGNOSIS — O24410 Gestational diabetes mellitus in pregnancy, diet controlled: Secondary | ICD-10-CM | POA: Diagnosis present

## 2021-06-18 DIAGNOSIS — O99119 Other diseases of the blood and blood-forming organs and certain disorders involving the immune mechanism complicating pregnancy, unspecified trimester: Secondary | ICD-10-CM | POA: Diagnosis present

## 2021-06-18 DIAGNOSIS — O26893 Other specified pregnancy related conditions, third trimester: Secondary | ICD-10-CM | POA: Diagnosis present

## 2021-06-18 DIAGNOSIS — Z8759 Personal history of other complications of pregnancy, childbirth and the puerperium: Secondary | ICD-10-CM

## 2021-06-18 LAB — CBC
HCT: 33.2 % — ABNORMAL LOW (ref 36.0–46.0)
HCT: 34.7 % — ABNORMAL LOW (ref 36.0–46.0)
Hemoglobin: 11 g/dL — ABNORMAL LOW (ref 12.0–15.0)
Hemoglobin: 11.6 g/dL — ABNORMAL LOW (ref 12.0–15.0)
MCH: 28.2 pg (ref 26.0–34.0)
MCH: 28.2 pg (ref 26.0–34.0)
MCHC: 33.1 g/dL (ref 30.0–36.0)
MCHC: 33.4 g/dL (ref 30.0–36.0)
MCV: 85.1 fL (ref 80.0–100.0)
MCV: 84.4 fL (ref 80.0–100.0)
Platelets: 125 10*3/uL — ABNORMAL LOW (ref 150–400)
Platelets: 141 10*3/uL — ABNORMAL LOW (ref 150–400)
RBC: 3.9 MIL/uL (ref 3.87–5.11)
RBC: 4.11 MIL/uL (ref 3.87–5.11)
RDW: 14.6 % (ref 11.5–15.5)
RDW: 14.6 % (ref 11.5–15.5)
WBC: 13.4 10*3/uL — ABNORMAL HIGH (ref 4.0–10.5)
WBC: 13.1 10*3/uL — ABNORMAL HIGH (ref 4.0–10.5)
nRBC: 0 % (ref 0.0–0.2)
nRBC: 0 % (ref 0.0–0.2)

## 2021-06-18 LAB — COMPREHENSIVE METABOLIC PANEL
ALT: 9 U/L (ref 0–44)
AST: 15 U/L (ref 15–41)
Albumin: 2.7 g/dL — ABNORMAL LOW (ref 3.5–5.0)
Alkaline Phosphatase: 150 U/L — ABNORMAL HIGH (ref 38–126)
Anion gap: 8 (ref 5–15)
BUN: 7 mg/dL (ref 6–20)
CO2: 19 mmol/L — ABNORMAL LOW (ref 22–32)
Calcium: 9 mg/dL (ref 8.9–10.3)
Chloride: 107 mmol/L (ref 98–111)
Creatinine, Ser: 0.67 mg/dL (ref 0.44–1.00)
GFR, Estimated: >60 mL/min (ref >60)
Glucose, Bld: 81 mg/dL (ref 70–99)
Potassium: 4 mmol/L (ref 3.5–5.1)
Sodium: 134 mmol/L — ABNORMAL LOW (ref 135–145)
Total Bilirubin: 0.7 mg/dL (ref 0.3–1.2)
Total Protein: 6.3 g/dL — ABNORMAL LOW (ref 6.5–8.1)

## 2021-06-18 LAB — RESP PANEL BY RT-PCR (FLU A&B, COVID) ARPGX2
Influenza A by PCR: NEGATIVE
Influenza B by PCR: NEGATIVE
SARS Coronavirus 2 by RT PCR: NEGATIVE

## 2021-06-18 LAB — TYPE AND SCREEN
ABO/RH(D): B POS
Antibody Screen: NEGATIVE

## 2021-06-18 LAB — GLUCOSE, CAPILLARY
Glucose-Capillary: 109 mg/dL — ABNORMAL HIGH (ref 70–99)
Glucose-Capillary: 84 mg/dL (ref 70–99)

## 2021-06-18 LAB — POCT FERN TEST: POCT Fern Test: POSITIVE

## 2021-06-18 MED ORDER — EPHEDRINE 5 MG/ML INJ: 10.0000 mg | INTRAVENOUS | Status: DC | PRN

## 2021-06-18 MED ORDER — DIPHENHYDRAMINE HCL 50 MG/ML IJ SOLN: 12.5000 mg | INTRAMUSCULAR | Status: DC | PRN

## 2021-06-18 MED ORDER — LACTATED RINGERS IV SOLN: 500.0000 mL | INTRAVENOUS | Status: DC | PRN

## 2021-06-18 MED ORDER — OXYTOCIN BOLUS FROM INFUSION: 333.0000 mL | Freq: Once | INTRAVENOUS | Status: DC

## 2021-06-18 MED ORDER — FENTANYL-BUPIVACAINE-NACL 0.5-0.125-0.9 MG/250ML-% EP SOLN
12.0000 mL/h | EPIDURAL | Status: DC | PRN
  Administered 2021-06-18: 12 mL/h via EPIDURAL
  Filled 2021-06-18: qty 250

## 2021-06-18 MED ORDER — LACTATED RINGERS IV SOLN: INTRAVENOUS | Status: DC

## 2021-06-18 MED ORDER — ONDANSETRON HCL 4 MG/2ML IJ SOLN: 4.0000 mg | Freq: Four times a day (QID) | INTRAMUSCULAR | Status: DC | PRN

## 2021-06-18 MED ORDER — OXYCODONE-ACETAMINOPHEN 5-325 MG PO TABS: 2.0000 | ORAL_TABLET | ORAL | Status: DC | PRN

## 2021-06-18 MED ORDER — TERBUTALINE SULFATE 1 MG/ML IJ SOLN: 0.2500 mg | Freq: Once | INTRAMUSCULAR | Status: DC | PRN

## 2021-06-18 MED ORDER — SOD CITRATE-CITRIC ACID 500-334 MG/5ML PO SOLN: 30.0000 mL | ORAL | Status: DC | PRN

## 2021-06-18 MED ORDER — LACTATED RINGERS IV SOLN: 500.0000 mL | Freq: Once | INTRAVENOUS | Status: DC

## 2021-06-18 MED ORDER — PHENYLEPHRINE 40 MCG/ML (10ML) SYRINGE FOR IV PUSH (FOR BLOOD PRESSURE SUPPORT)
80.0000 ug | PREFILLED_SYRINGE | INTRAVENOUS | Status: DC | PRN
  Filled 2021-06-18: qty 10

## 2021-06-18 MED ORDER — LIDOCAINE HCL (PF) 1 % IJ SOLN
INTRAMUSCULAR | Status: DC | PRN
  Administered 2021-06-18: 10 mL via EPIDURAL

## 2021-06-18 MED ORDER — LIDOCAINE HCL (PF) 1 % IJ SOLN
30.0000 mL | INTRAMUSCULAR | Status: DC | PRN
Start: 2021-06-18 — End: 2021-06-19

## 2021-06-18 MED ORDER — FLEET ENEMA 7-19 GM/118ML RE ENEM: 1.0000 | ENEMA | RECTAL | Status: DC | PRN

## 2021-06-18 MED ORDER — PHENYLEPHRINE 40 MCG/ML (10ML) SYRINGE FOR IV PUSH (FOR BLOOD PRESSURE SUPPORT): 80.0000 ug | PREFILLED_SYRINGE | INTRAVENOUS | Status: DC | PRN

## 2021-06-18 MED ORDER — OXYTOCIN-SODIUM CHLORIDE 30-0.9 UT/500ML-% IV SOLN
2.5000 [IU]/h | INTRAVENOUS | Status: DC
  Filled 2021-06-18: qty 500

## 2021-06-18 MED ORDER — OXYCODONE-ACETAMINOPHEN 5-325 MG PO TABS: 1.0000 | ORAL_TABLET | ORAL | Status: DC | PRN

## 2021-06-18 MED ORDER — ACETAMINOPHEN 325 MG PO TABS: 650.0000 mg | ORAL_TABLET | ORAL | Status: DC | PRN

## 2021-06-18 NOTE — MAU Note (Signed)
Pamela Doyle is a 25 y.o. at [redacted]w[redacted]d here in MAU reporting: contractions since this morning when she woke up, they are now every 4 min. States some pink mucus bleeding but no LOF. Has felt FM but states less than normal.   Onset of complaint: today  Pain score: 3/10

## 2021-06-18 NOTE — H&P (Addendum)
OBSTETRIC ADMISSION HISTORY AND PHYSICAL  Pamela Doyle is a 25 y.o. female G2P1001 with IUP at 53w6dby 10 week ultrasound presenting for SROM and contractions. She reports +FMs, no VB, no blurry vision, headaches or peripheral edema, and RUQ pain.  She plans on breast feeding. She requests nothing for birth control. She received her prenatal care at  [facility]   Dating: By Ultrasound at 10 weeks --->  Estimated Date of Delivery: 07/03/21  Sono:   _0 , normal anatomy and interval growth, cephalic presentation, placenta anterior normal amniotic fluid volume, 3552g, 86% EFW  Prenatal History/Complications:  GDMA1 Thrombocytopenia (last platelets 111 on 7/26) Short interval pregnancy Depression Hx of gestational HTN  Past Medical History: Past Medical History:  Diagnosis Date   Biological false positive RPR test 12/23/2019   Negative T. Pallidium Titer 1:1   Chronic kidney disease    mal rotated kidneys    Past Surgical History: Past Surgical History:  Procedure Laterality Date   NO PAST SURGERIES      Obstetrical History: OB History     Gravida  2   Para  1   Term  1   Preterm      AB      Living  1      SAB      IAB      Ectopic      Multiple  0   Live Births  1           Social History Social History   Socioeconomic History   Marital status: Married    Spouse name: Not on file   Number of children: Not on file   Years of education: Not on file   Highest education level: Not on file  Occupational History   Not on file  Tobacco Use   Smoking status: Never   Smokeless tobacco: Never  Vaping Use   Vaping Use: Never used  Substance and Sexual Activity   Alcohol use: Not Currently    Comment: Socially   Drug use: Not Currently   Sexual activity: Yes    Birth control/protection: None  Other Topics Concern   Not on file  Social History Narrative   Not on file   Social Determinants of Health   Financial Resource Strain: Not on file   Food Insecurity: Not on file  Transportation Needs: Not on file  Physical Activity: Not on file  Stress: Not on file  Social Connections: Not on file    Family History: Family History  Adopted: Yes  Problem Relation Age of Onset   Healthy Mother    Drug abuse Father    Breast cancer Maternal Grandmother     Allergies: No Known Allergies  Medications Prior to Admission  Medication Sig Dispense Refill Last Dose   Multiple Vitamin (MULTIVITAMIN WITH MINERALS) TABS tablet Take 1 tablet by mouth daily.   06/18/2021 at 1030   Accu-Chek Softclix Lancets lancets 1 each by Other route 4 (four) times daily. 100 each 12    acetaminophen (TYLENOL) 325 MG tablet Take 2 tablets (650 mg total) by mouth every 6 (six) hours. 60 tablet 0 More than a month   aspirin EC 81 MG tablet Take 1 tablet (81 mg total) by mouth daily. Take after 12 weeks for prevention of preeclampsia later in pregnancy 300 tablet 2    Blood Glucose Monitoring Suppl (ACCU-CHEK GUIDE) w/Device KIT 1 Device by Does not apply route 4 (four) times daily. 1 kit  0    glucose blood (ACCU-CHEK GUIDE) test strip Use to check blood sugars four times a day was instructed 50 each 12    loratadine (CLARITIN) 10 MG tablet Take 1 tablet (10 mg total) by mouth daily. 30 tablet 3      Review of Systems   All systems reviewed and negative except as stated in HPI  Blood pressure 116/68, pulse (!) 104, temperature 98.2 F (36.8 C), temperature source Oral, resp. rate 17, height _0  (1.727 m), weight 105.2 kg, last menstrual period 09/18/2020, SpO2 100 %, currently breastfeeding.  General appearance: alert and cooperative Lungs: clear to auscultation bilaterally Heart: regular rate and rhythm Abdomen: soft, non-tender; bowel sounds normal Pelvic: 4-5/80/-2 Extremities: Homans sign is negative, no sign of DVT Presentation: cephalic Fetal monitoring: Baseline: 150 bpm, moderate variability, + accels, no decels Uterine activity: Every  2-4 minutes Dilation: 4.5 Effacement (%): 80 Station: -2 Exam by:: Dr. 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, [place], [place] [address]   Maryann Alar Test   Collection Time: 06/18/21  3:46 PM  Result Value Ref Range   POCT Fern Test Positive = ruptured amniotic membanes   Resp Panel by RT-PCR (Flu A&B, Covid) Nasopharyngeal Swab   Collection Time: 06/18/21  3:52 PM   Specimen: Nasopharyngeal Swab; Nasopharyngeal(NP) swabs in vial transport medium  Result Value Ref Range   SARS Coronavirus 2 by RT PCR NEGATIVE NEGATIVE   Influenza A by PCR NEGATIVE NEGATIVE   Influenza B by PCR NEGATIVE NEGATIVE  CBC   Collection Time: 06/18/21  4:09 PM  Result Value Ref Range   WBC 13.4 (H) 4.0 - 10.5 K/uL   RBC 3.90 3.87 - 5.11 MIL/uL   Hemoglobin 11.0 (L) 12.0 - 15.0 g/dL   HCT 33.2 (L) 36.0 - 46.0 %   MCV 85.1 80.0 - 100.0 fL   MCH 28.2 26.0 - 34.0 pg    MCHC 33.1 30.0 - 36.0 g/dL   RDW 14.6 11.5 - 15.5 %   Platelets 125 (L) 150 - 400 K/uL   nRBC 0.0 0.0 - 0.2 %  Glucose, capillary   Collection Time: 06/18/21  5:44 PM  Result Value Ref Range   Glucose-Capillary 109 (H) 70 - 99 mg/dL    Patient Active Problem List   Diagnosis Date Noted   Labor without complication 25/03/3975   GDM (gestational diabetes mellitus), class A1 04/13/2021   Supervision of high risk pregnancy, antepartum  12/06/2020   Short interval between pregnancies affecting pregnancy, antepartum 12/06/2020   History of gestational hypertension 07/14/2020   Gestational thrombocytopenia ([place]) 04/15/2020   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 G2P1001 at 78w6dhere for SROM and contractions.  #Labor:Admitted for SROM and early labor. Patient not yet making adequate change but does not want to start augmentation at this time. Is agreeable to starting low dose pitocin if no change at next check. Will try other ways to induce labor #Gestational Thrombocytopenia: admission Plt: 125 #GDMA1: diet controlled, CBG q4 while in latent labor. #Pain: Epidural as desired #FWB: Cat 1 #ID:  GBS neg #MOF: Breast #MOC:Declines at this time #Circ:  Yes  AWaldon Merl MD  Resident Physician  06/18/2021, 6:57 PM  Attestation of Supervision of Resident:  I confirm that I have verified the information documented in the resident's note and that I have also personally performed the history, physical exam and all medical decision making activities.  I have verified that all services and findings are accurately documented in this note; and I agree with management and plan as outlined in the documentation. I have also made any necessary editorial changes.  #Mild range blood pressures- Two mild range blood pressures noted since admission, patient is asymptomatic. Previously noted gestational thrombocytopenia, Pre-E labs pending.  # Obesity #CF  carrier  MLenoria Chime MD [facility]8/11/2020 7:57 PM

## 2021-06-18 NOTE — Progress Notes (Signed)
Labor Progress Note Pamela Doyle is a 25 y.o. G2P1001 at [redacted]w[redacted]d presented for early labor-SROM@1515  S: Patient is resting comfortably, has been feeling contractions.  O:  BP 136/72   Pulse 82   Temp 98.6 F (37 C) (Oral)   Resp 16   Ht 5\' 8"  (1.727 m)   Wt 105.2 kg   LMP 09/18/2020 (Exact Date)   SpO2 100%   BMI 35.28 kg/m  EFM: baseline ~165BPM/+accels/-decels/mod variability Toco: contractions q3-16min   CVE: Dilation: 5 Effacement (%): 80 Cervical Position: Middle Station: -2 Presentation: Vertex Exam by:: Kyre Jeffries MD   A&P: 25 y.o. G2P1001 [redacted]w[redacted]d early labor-SROM #Labor: Pt requested another check to see where she was. Continue with expectant management. Pitocin might need to be started at some point depending on progression of labor.  #Pain: Epidural as desired  #FWB: Slightly tachy-good variability, pt afebrile, continue to monitor  #GBS negative #GDMA1: CBGs: 109>84  [redacted]w[redacted]d, MD [facility] for [name], [facility] 10:15 PM

## 2021-06-18 NOTE — Anesthesia Procedure Notes (Signed)
Epidural Patient location during procedure: OB Start time: 06/18/2021 11:20 PM End time: 06/18/2021 11:24 PM  Staffing Anesthesiologist: Leilani Able, MD Performed: anesthesiologist   Preanesthetic Checklist Completed: patient identified, IV checked, site marked, risks and benefits discussed, surgical consent, monitors and equipment checked, pre-op evaluation and timeout performed  Epidural Patient position: sitting Prep: DuraPrep and site prepped and draped Patient monitoring: continuous pulse ox and blood pressure Approach: midline Location: L3-L4 Injection technique: LOR air  Needle:  Needle type: Tuohy  Needle gauge: 17 G Needle length: 9 cm and 9 Needle insertion depth: 6 cm Catheter type: closed end flexible Catheter size: 19 Gauge Catheter at skin depth: 11 cm Test dose: negative and Other  Assessment Events: blood not aspirated, injection not painful, no injection resistance, no paresthesia and negative IV test  Additional Notes Reason for block:procedure for pain

## 2021-06-18 NOTE — H&P (Addendum)
OBSTETRIC ADMISSION HISTORY AND PHYSICAL  Pamela Doyle is a 25 y.o. female G2P1001 with IUP at [redacted]w[redacted]d by 10 week ultrasound presenting for SROM and contractions. She reports +FMs, no VB, no blurry vision, headaches or peripheral edema, and RUQ pain.  She plans on breast feeding. She request condoms for birth control. She received her prenatal care at  [facility]    Dating: By Ultrasound at 10 weeks --->  Estimated Date of Delivery: 07/03/21  Sono:   '@37w3'$ , normal anatomy and interval growth, cephalic presentation, placenta anterior normal amniotic fluid volume, 3552g, 86% EFW  Prenatal History/Complications:  GDMA1 Thrombocytopenia (last platelets 111 on 7/26) Short interval pregnancy Depression Hx of gestational HTN  Past Medical History: Past Medical History:  Diagnosis Date   Biological false positive RPR test 12/23/2019   Negative T. Pallidium Titer 1:1   Chronic kidney disease    mal rotated kidneys    Past Surgical History: Past Surgical History:  Procedure Laterality Date   NO PAST SURGERIES      Obstetrical History: OB History     Gravida  2   Para  1   Term  1   Preterm      AB      Living  1      SAB      IAB      Ectopic      Multiple  0   Live Births  1           Social History Social History   Socioeconomic History   Marital status: Married    Spouse name: Not on file   Number of children: Not on file   Years of education: Not on file   Highest education level: Not on file  Occupational History   Not on file  Tobacco Use   Smoking status: Never   Smokeless tobacco: Never  Vaping Use   Vaping Use: Never used  Substance and Sexual Activity   Alcohol use: Not Currently    Comment: Socially   Drug use: Not Currently   Sexual activity: Yes    Birth control/protection: None  Other Topics Concern   Not on file  Social History Narrative   Not on file   Social Determinants of Health   Financial Resource Strain: Not on file   Food Insecurity: Not on file  Transportation Needs: Not on file  Physical Activity: Not on file  Stress: Not on file  Social Connections: Not on file    Family History: Family History  Adopted: Yes  Problem Relation Age of Onset   Healthy Mother    Drug abuse Father    Breast cancer Maternal Grandmother     Allergies: No Known Allergies  Medications Prior to Admission  Medication Sig Dispense Refill Last Dose   Multiple Vitamin (MULTIVITAMIN WITH MINERALS) TABS tablet Take 1 tablet by mouth daily.   06/18/2021 at 1030   Accu-Chek Softclix Lancets lancets 1 each by Other route 4 (four) times daily. 100 each 12    acetaminophen (TYLENOL) 325 MG tablet Take 2 tablets (650 mg total) by mouth every 6 (six) hours. 60 tablet 0 More than a month   aspirin EC 81 MG tablet Take 1 tablet (81 mg total) by mouth daily. Take after 12 weeks for prevention of preeclampsia later in pregnancy 300 tablet 2    Blood Glucose Monitoring Suppl (ACCU-CHEK GUIDE) w/Device KIT 1 Device by Does not apply route 4 (four) times daily. 1 kit  0    glucose blood (ACCU-CHEK GUIDE) test strip Use to check blood sugars four times a day was instructed 50 each 12    loratadine (CLARITIN) 10 MG tablet Take 1 tablet (10 mg total) by mouth daily. 30 tablet 3      Review of Systems   All systems reviewed and negative except as stated in HPI  Blood pressure (!) 146/86, pulse 96, temperature 98.5 F (36.9 C), temperature source Oral, resp. rate 17, last menstrual period 09/18/2020, currently breastfeeding.  General appearance: alert and no distress Lungs: clear to auscultation bilaterally Heart: regular rate and rhythm Abdomen: soft, non-tender; bowel sounds normal Extremities: Homans sign is negative, no sign of DVT Presentation: cephalic Fetal monitoring: Baseline: 150 bpm, moderate variability, + accels, no decels Uterine activity: Every 2-4 minutes Dilation: 4 Effacement (%): 60 Station: -2 Exam by:: [employer] RN   Prenatal labs: ABO, Rh: --/--/B POS (08/01 1422) Antibody: NEG (08/01 1422) Rubella: 1.01 (01/19 1058) RPR: Non Reactive (05/25 0925)  HBsAg: Negative (01/19 1058)  HIV: Non Reactive (05/25 0925)  GBS: Negative/-- (07/19 1050)  1 hr Glucola 195 Genetic screening low risk NIPS, 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  Anatomy US normal   Prenatal Transfer Tool  Maternal Diabetes: Yes:  Diabetes Type:  Diet controlled Genetic Screening: Low risk NIPS; 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, [place], [place] [address]   [place] Test   Collection Time: 06/18/21  3:46 PM  Result Value Ref Range   POCT Fern Test Positive = ruptured amniotic membanes   CBC   Collection Time: 06/18/21  4:09 PM  Result Value Ref Range   WBC 13.4 (H) 4.0 - 10.5 K/uL   RBC 3.90 3.87 - 5.11 MIL/uL   Hemoglobin 11.0 (L) 12.0 - 15.0 g/dL   HCT 33.2 (L) 36.0 - 46.0 %   MCV 85.1 80.0 - 100.0 fL   MCH 28.2 26.0 - 34.0 pg   MCHC 33.1 30.0 - 36.0 g/dL   RDW 14.6 11.5 - 15.5 %   Platelets 125 (L) 150 - 400 K/uL   nRBC 0.0 0.0 - 0.2 %    Patient Active Problem List   Diagnosis Date Noted   Labor without complication 81/11/7508   GDM (gestational diabetes mellitus), class A1 04/13/2021   Supervision of high risk pregnancy, antepartum 12/06/2020   Short interval between pregnancies affecting pregnancy, antepartum  12/06/2020   History of gestational hypertension 07/14/2020   Gestational thrombocytopenia ([place]) 04/15/2020   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 G2P1001 at [redacted]w[redacted]d here for SROM and contractions.  #Labor: Will continue expectant management. Consider pitocin dependent on progression of labor #Gestational Thrombocytopenia: Plts 125. Will continue to monitor   #GDMA1: Diet controlled, CBG q4h #Pain: PRN #FWB: Category I strip #ID:  GBS negative #MOF: Breastfeeding #MOC:Condoms #Circ:  Yes  Nelva Nay,  Medical Student  06/18/2021, 5:32 PM  Attestation of Supervision of Student:  I confirm that I have verified the information documented in the medical student's note and that I have also personally performed the history, physical exam and all medical decision making activities.  I have verified that all services and findings are accurately documented in this student's note; and I agree with management and plan as outlined in the documentation. I have also made any necessary editorial changes.  #Mild range blood pressures- Two mild range blood pressures noted since admission, patient is asymptomatic. Previously noted gestational thrombocytopenia, Pre-E labs pending.   # Obesity #CF carrier    Lenoria Chime, MD Center for [facility], [facility] 06/18/2021 8:51 PM   Attestation of Attending Supervision of Obstetric Fellow: Evaluation and management procedures were performed by the Obstetric Fellow under my supervision and collaboration.  I have reviewed the Obstetric Fellow's note and chart, and I agree with the management and plan. I have also made any necessary editorial changes.   Clarnce Flock, MD Family Medicine Attending, [facility] for [facility], [facility] 06/20/2021 7:32 AM

## 2021-06-18 NOTE — Telephone Encounter (Signed)
Pamela Doyle  is a G2P1001 at [redacted]w[redacted]d calling stating she lost her mucous lug two days ago and now is having moire mucous with bloody show. Pt states she has had constant cramping as well since she woke up. Pt was 2cm last week at [facility]. Advised pt to go ahead and go to MAU for labor eval due to the bloody show. Pt verbalizes and understands.

## 2021-06-18 NOTE — Anesthesia Preprocedure Evaluation (Signed)
Anesthesia Evaluation  Patient identified by MRN, date of birth, ID band Patient awake    Reviewed: Allergy & Precautions, Patient's Chart, lab work & pertinent test results  Airway Mallampati: II   Neck ROM: Full    Dental no notable dental hx. (+) Teeth Intact   Pulmonary neg pulmonary ROS,    Pulmonary exam normal        Cardiovascular hypertension, Normal cardiovascular exam     Neuro/Psych negative neurological ROS  negative psych ROS   GI/Hepatic Neg liver ROS, GERD  ,  Endo/Other  diabetes, GestationalObesity  Renal/GU Renal diseaseHx/o malrotated kidneys  negative genitourinary   Musculoskeletal negative musculoskeletal ROS (+)   Abdominal (+) + obese,   Peds  Hematology  (+) anemia ,   Anesthesia Other Findings   Reproductive/Obstetrics (+) Pregnancy                             Anesthesia Physical  Anesthesia Plan  ASA: 2  Anesthesia Plan: Epidural   Post-op Pain Management:    Induction:   PONV Risk Score and Plan:   Airway Management Planned:   Additional Equipment:   Intra-op Plan:   Post-operative Plan:   Informed Consent: I have reviewed the patients History and Physical, chart, labs and discussed the procedure including the risks, benefits and alternatives for the proposed anesthesia with the patient or authorized representative who has indicated his/her understanding and acceptance.       Plan Discussed with: Anesthesiologist  Anesthesia Plan Comments:         Anesthesia Quick Evaluation

## 2021-06-19 ENCOUNTER — Encounter (HOSPITAL_COMMUNITY): Payer: Self-pay | Admitting: Obstetrics & Gynecology

## 2021-06-19 ENCOUNTER — Encounter: Payer: Medicaid Other | Admitting: Obstetrics and Gynecology

## 2021-06-19 DIAGNOSIS — Z3A38 38 weeks gestation of pregnancy: Secondary | ICD-10-CM

## 2021-06-19 DIAGNOSIS — O24420 Gestational diabetes mellitus in childbirth, diet controlled: Secondary | ICD-10-CM

## 2021-06-19 DIAGNOSIS — O9912 Other diseases of the blood and blood-forming organs and certain disorders involving the immune mechanism complicating childbirth: Secondary | ICD-10-CM

## 2021-06-19 DIAGNOSIS — O4202 Full-term premature rupture of membranes, onset of labor within 24 hours of rupture: Secondary | ICD-10-CM

## 2021-06-19 LAB — RPR: RPR Ser Ql: NONREACTIVE

## 2021-06-19 LAB — GLUCOSE, CAPILLARY
Glucose-Capillary: 129 mg/dL — ABNORMAL HIGH (ref 70–99)
Glucose-Capillary: 96 mg/dL (ref 70–99)
Glucose-Capillary: 89 mg/dL (ref 70–99)

## 2021-06-19 LAB — PROTEIN / CREATININE RATIO, URINE
Creatinine, Urine: 276.36 mg/dL
Protein Creatinine Ratio: 0.07 mg/mg{Cre} (ref 0.00–0.15)
Total Protein, Urine: 20 mg/dL

## 2021-06-19 MED ORDER — ACETAMINOPHEN 325 MG PO TABS
650.0000 mg | ORAL_TABLET | ORAL | Status: DC | PRN
  Administered 2021-06-19 – 2021-06-20 (×2): 650 mg via ORAL
  Filled 2021-06-19 (×2): qty 2

## 2021-06-19 MED ORDER — BENZOCAINE-MENTHOL 20-0.5 % EX AERO
1.0000 "application " | INHALATION_SPRAY | CUTANEOUS | Status: DC | PRN
  Filled 2021-06-19: qty 56

## 2021-06-19 MED ORDER — SENNOSIDES-DOCUSATE SODIUM 8.6-50 MG PO TABS
2.0000 | ORAL_TABLET | ORAL | Status: DC
  Administered 2021-06-20: 2 via ORAL
  Filled 2021-06-19: qty 2

## 2021-06-19 MED ORDER — OXYTOCIN-SODIUM CHLORIDE 30-0.9 UT/500ML-% IV SOLN
1.0000 m[IU]/min | INTRAVENOUS | Status: DC
  Administered 2021-06-19: 2 m[IU]/min via INTRAVENOUS

## 2021-06-19 MED ORDER — ZOLPIDEM TARTRATE 5 MG PO TABS: 5.0000 mg | ORAL_TABLET | Freq: Every evening | ORAL | Status: DC | PRN

## 2021-06-19 MED ORDER — COCONUT OIL OIL: 1.0000 "application " | TOPICAL_OIL | Status: DC | PRN

## 2021-06-19 MED ORDER — PRENATAL MULTIVITAMIN CH
1.0000 | ORAL_TABLET | Freq: Every day | ORAL | Status: DC
  Administered 2021-06-20: 1 via ORAL
  Filled 2021-06-19 (×2): qty 1

## 2021-06-19 MED ORDER — ONDANSETRON HCL 4 MG PO TABS: 4.0000 mg | ORAL_TABLET | ORAL | Status: DC | PRN

## 2021-06-19 MED ORDER — ONDANSETRON HCL 4 MG/2ML IJ SOLN: 4.0000 mg | INTRAMUSCULAR | Status: DC | PRN

## 2021-06-19 MED ORDER — TERBUTALINE SULFATE 1 MG/ML IJ SOLN
0.2500 mg | Freq: Once | INTRAMUSCULAR | Status: DC | PRN
Start: 2021-06-19 — End: 2021-06-19

## 2021-06-19 MED ORDER — WITCH HAZEL-GLYCERIN EX PADS: 1.0000 "application " | MEDICATED_PAD | CUTANEOUS | Status: DC | PRN

## 2021-06-19 MED ORDER — DIPHENHYDRAMINE HCL 25 MG PO CAPS: 25.0000 mg | ORAL_CAPSULE | Freq: Four times a day (QID) | ORAL | Status: DC | PRN

## 2021-06-19 MED ORDER — SIMETHICONE 80 MG PO CHEW: 80.0000 mg | CHEWABLE_TABLET | ORAL | Status: DC | PRN

## 2021-06-19 MED ORDER — IBUPROFEN 600 MG PO TABS
600.0000 mg | ORAL_TABLET | Freq: Four times a day (QID) | ORAL | Status: DC
  Administered 2021-06-19 – 2021-06-20 (×4): 600 mg via ORAL
  Filled 2021-06-19 (×4): qty 1

## 2021-06-19 MED ORDER — TETANUS-DIPHTH-ACELL PERTUSSIS 5-2.5-18.5 LF-MCG/0.5 IM SUSY: 0.5000 mL | PREFILLED_SYRINGE | Freq: Once | INTRAMUSCULAR | Status: DC

## 2021-06-19 MED ORDER — DIBUCAINE (PERIANAL) 1 % EX OINT: 1.0000 "application " | TOPICAL_OINTMENT | CUTANEOUS | Status: DC | PRN

## 2021-06-19 NOTE — Progress Notes (Signed)
Labor Progress Note Alfie Alderfer is a 25 y.o. G2P1001 at [redacted]w[redacted]d presented for early labor-SROM@1515  S: Patient is sleeping, received epidural.   O:  BP (!) 117/55   Pulse 81   Temp 98.4 F (36.9 C) (Oral)   Resp 15   Ht 5\' 8"  (1.727 m)   Wt 105.2 kg   LMP 09/18/2020 (Exact Date)   SpO2 100%   BMI 35.28 kg/m  EFM: baseline 140BPM/+accels/ variable decels/mod variability Toco: contractions q3-66min   CVE: Dilation: Lip/rim Effacement (%): 90 Cervical Position: Middle Station: 0 Presentation: Vertex Exam by:: HopkinsRN (per patient request, feeling pressure)   A&P: 25 y.o. G2P1001 [redacted]w[redacted]d early labor-SROM #Labor: Pit@4ml /hr. Pt is progressing well. Anticipate SVD.  #Pain: Epidural #FWB: cat 2> overall reassuring with accels and mod variability  #GBS negative #GDMA1: CBGs: [redacted]w[redacted]d  031>59>458>59, MD [facility] for [name], [facility] 7:15 AM

## 2021-06-19 NOTE — Discharge Summary (Signed)
Postpartum Discharge Summary     Patient Name: Pamela Doyle DOB: 1996/04/02 MRN: [id number]  Date of admission: 06/18/2021 Delivery date:06/19/2021  Delivering provider: Gladys Damme  Date of discharge: 06/20/2021  Admitting diagnosis: Labor without complication [W62] Intrauterine pregnancy: [redacted]w[redacted]d     Secondary diagnosis:  Active Problems:   Cystic fibrosis carrier   Gestational thrombocytopenia ([place])   History of gestational hypertension   Short interval between pregnancies affecting pregnancy, antepartum   GDM (gestational diabetes mellitus), class A1   Labor without complication  Additional problems: none    Discharge diagnosis: Term Pregnancy Delivered, GDM A1, and Gestational Thrombocytopenia                                               Post partum procedures: none Augmentation: Pitocin Complications: None  Hospital course: Onset of Labor With Vaginal Delivery      25 y.o. yo [id number] at [redacted]w[redacted]d was admitted in Latent Labor on 06/18/2021. Her platelets during labor were stable at 125K and 141K, and her CBGs were also within nl range. Patient had an uncomplicated labor course as follows:  Membrane Rupture Time/Date: 3:15 PM ,06/18/2021   Delivery Method:Vaginal, Spontaneous  Episiotomy: None  Lacerations:  None  Patient had an uncomplicated postpartum course. Platelets were 101 and fasting CBG was 91. She is ambulating, tolerating a regular diet, passing flatus, and urinating well. Patient is discharged home in stable condition on 06/20/21.  Newborn Data: Birth date:06/19/2021  Birth time:8:16 AM  Gender:Female  Living status:Living  Apgars:9 ,9  Weight:3419 g (7lb 8.6oz)  Magnesium Sulfate received: No BMZ received: No Rhophylac:N/A MMR:N/A T-DaP:Given prenatally Flu: No Transfusion:No  Physical exam  Vitals:   06/19/21 1536 06/19/21 2033 06/20/21 0007 06/20/21 0531  BP: 121/69 116/76 129/80 118/84  Pulse: 90 77 86 84  Resp: $Remo'18 18 18 16  'qoMHQ$ Temp: 98.2 F (36.8 C)  97.8 F (36.6 C) 97.7 F (36.5 C) 97.9 F (36.6 C)  TempSrc: Oral Oral Oral Oral  SpO2:  100% 100% 100%  Weight:      Height:       General: alert, cooperative, and no distress Lochia: appropriate Uterine Fundus: firm Incision: N/A DVT Evaluation: No evidence of DVT seen on physical exam. No cords or calf tenderness. No significant calf/ankle edema. Labs: Lab Results  Component Value Date   WBC 9.6 06/20/2021   HGB 9.6 (L) 06/20/2021   HCT 30.0 (L) 06/20/2021   MCV 86.2 06/20/2021   PLT 101 (L) 06/20/2021   CMP Latest Ref Rng & Units 06/20/2021  Glucose 70 - 99 mg/dL 91  BUN 6 - 20 mg/dL -  Creatinine 0.44 - 1.00 mg/dL -  Sodium 135 - 145 mmol/L -  Potassium 3.5 - 5.1 mmol/L -  Chloride 98 - 111 mmol/L -  CO2 22 - 32 mmol/L -  Calcium 8.9 - 10.3 mg/dL -  Total Protein 6.5 - 8.1 g/dL -  Total Bilirubin 0.3 - 1.2 mg/dL -  Alkaline Phos 38 - 126 U/L -  AST 15 - 41 U/L -  ALT 0 - 44 U/L -   Edinburgh Score: Edinburgh Postnatal Depression Scale Screening Tool 06/19/2021  I have been able to laugh and see the funny side of things. 0  I have looked forward with enjoyment to things. 0  I have blamed myself unnecessarily when things  went wrong. 0  I have been anxious or worried for no good reason. 0  I have felt scared or panicky for no good reason. 0  Things have been getting on top of me. 0  I have been so unhappy that I have had difficulty sleeping. 0  I have felt sad or miserable. 0  I have been so unhappy that I have been crying. 0  The thought of harming myself has occurred to me. 0  Edinburgh Postnatal Depression Scale Total 0     After visit meds:  Allergies as of 06/20/2021   No Known Allergies      Medication List     STOP taking these medications    Accu-Chek Guide test strip Generic drug: glucose blood   Accu-Chek Softclix Lancets lancets   aspirin EC 81 MG tablet       TAKE these medications    Accu-Chek Guide w/Device Kit 1 Device by Does  not apply route 4 (four) times daily.   acetaminophen 325 MG tablet Commonly known as: Tylenol Take 2 tablets (650 mg total) by mouth every 6 (six) hours.   ibuprofen 600 MG tablet Commonly known as: ADVIL Take 1 tablet (600 mg total) by mouth every 6 (six) hours.   loratadine 10 MG tablet Commonly known as: Claritin Take 1 tablet (10 mg total) by mouth daily.   multivitamin with minerals Tabs tablet Take 1 tablet by mouth daily.         Discharge home in stable condition Infant Feeding: Bottle Infant Disposition: Home with mom, or rooming in depending on Peds disposition Discharge instruction: per After Visit Summary and Postpartum booklet. Activity: Advance as tolerated. Pelvic rest for 6 weeks.  Diet: routine diet Future Appointments: Future Appointments  Date Time Provider [place]  07/17/2021  8:35 AM Anyanwu, Sallyanne Havers, MD [facility]   Follow up Visit:  [place] [facility] [place] [facility] Follow up.   Specialty: Obstetrics and Gynecology Why: As scheduled for postpartum visit Contact information: [address] [place], [place], [place] Support Pool Please schedule this patient for Postpartum visit in: 4 weeks with the following provider: Any provider  In-Person  For C/S patients schedule nurse incision check in weeks 2 weeks: no  High risk pregnancy complicated by: [email or web address]  Delivery mode:  SVD  Anticipated Birth Control:  Condoms  PP Procedures needed: 2 hour GTT  Schedule Integrated [place] visit: no  06/20/2021 Kerry Hough, PA-C

## 2021-06-19 NOTE — Lactation Note (Signed)
This note was copied from a baby's chart. Lactation Consultation Note Mom is no longer going to BF. Mom is just going to formula feed.  Patient Name: Pamela Doyle Date: 06/19/2021 Reason for consult: Follow-up assessment;Early term 37-38.6wks;Maternal endocrine disorder Age:25 hours  Maternal Data    Feeding Mother's Current Feeding Choice: Formula  LATCH Score                    Lactation Tools Discussed/Used    Interventions    Discharge    Consult Status Consult Status: Complete Date: 06/19/21    Charyl Dancer 06/19/2021, 8:38 PM

## 2021-06-19 NOTE — Anesthesia Postprocedure Evaluation (Signed)
Anesthesia Post Note  Patient: Pamela Doyle  Procedure(s) Performed: AN AD HOC LABOR EPIDURAL     Patient location during evaluation: Mother Baby Anesthesia Type: Epidural Level of consciousness: awake and alert Pain management: pain level controlled Vital Signs Assessment: post-procedure vital signs reviewed and stable Respiratory status: spontaneous breathing, nonlabored ventilation and respiratory function stable Cardiovascular status: stable Postop Assessment: no headache, no backache and epidural receding Anesthetic complications: no   No notable events documented.  Last Vitals:  Vitals:   06/19/21 1204 06/19/21 1536  BP: 125/80 121/69  Pulse: 79 90  Resp: 18 18  Temp: 36.9 C 36.8 C  SpO2:      Last Pain:  Vitals:   06/19/21 1537  TempSrc:   PainSc: 3    Pain Goal:                   Rica Records

## 2021-06-19 NOTE — Progress Notes (Signed)
Labor Progress Note Pamela Doyle is a 25 y.o. G2P1001 at [redacted]w[redacted]d presented for early labor-SROM@1515  S: Patient is resting comfortably, has been feeling contractions.  O:  BP 126/72   Pulse 89   Temp 98.3 F (36.8 C) (Oral)   Resp 16   Ht 5\' 8"  (1.727 m)   Wt 105.2 kg   LMP 09/18/2020 (Exact Date)   SpO2 100%   BMI 35.28 kg/m  EFM: baseline 140BPM/+accels/-decels/mod variability Toco: contractions q3-40min   CVE: Dilation:  (check deferred per simpson cnm) Effacement (%): 80 Cervical Position: [place]: -2 Presentation: Vertex Exam by:: Hopkins RN   A&P: 25 y.o. G2P1001 [redacted]w[redacted]d early labor-SROM #Labor: Discussed with the pt that she is not making change and has had SROM making her more susceptible to infection. Using shared decision making, we decided on starting pit 2/2 and titrating as tolerated to augment labor.  #Pain: Epidural #FWB: cat 1 #GBS negative #GDMA1: CBGs: [redacted]w[redacted]d.   [id number], MD Center for [name], [facility] 2:43 AM

## 2021-06-20 LAB — GLUCOSE, RANDOM: Glucose, Bld: 91 mg/dL (ref 70–99)

## 2021-06-20 LAB — CBC
HCT: 30 % — ABNORMAL LOW (ref 36.0–46.0)
Hemoglobin: 9.6 g/dL — ABNORMAL LOW (ref 12.0–15.0)
MCH: 27.6 pg (ref 26.0–34.0)
MCHC: 32 g/dL (ref 30.0–36.0)
MCV: 86.2 fL (ref 80.0–100.0)
Platelets: 101 10*3/uL — ABNORMAL LOW (ref 150–400)
RBC: 3.48 MIL/uL — ABNORMAL LOW (ref 3.87–5.11)
RDW: 14.8 % (ref 11.5–15.5)
WBC: 9.6 10*3/uL (ref 4.0–10.5)
nRBC: 0 % (ref 0.0–0.2)

## 2021-06-20 MED ORDER — IBUPROFEN 600 MG PO TABS: 600.0000 mg | ORAL_TABLET | Freq: Four times a day (QID) | ORAL | 0 refills | Status: DC

## 2021-06-20 NOTE — Social Work (Signed)
CSW received consult for hx of Depression.  CSW met with MOB to offer support and complete assessment.     CSW introduced self and role. CSW observed infant 'Starleen Blue' sleeping in bassinet and FOB [place] bedside. MOB declined to have FOB leave the room for assessment. CSW informed MOB of reason for consult and assessed current emotions. MOB reported "I'm doing better than expected" and stated she had a good pregnancy. CSW inquired on mental health. MOB stated she has never officially been diagnosed with depression, however she did experience symptoms at the start of her pregnancy. MOB shared she had family stressors that she believes contributed to the symptoms. MOB stated she took Zoloft for about 3 weeks before discontinuing. MOB reported she has not experienced any additional mental health symptoms. Aside from FOB, MOB identified both her and FOB as supports. MOB denies any current SI or HI. MOB was appropriate throughout interaction and was not observed displaying any mental health symptoms.   CSW provided education regarding the baby blues period versus PPD and provided resources. CSW provided the New Mom Checklist and encouraged MOB to self evaluate and contact a medical professional if symptoms are noted at any time.   CSW provided review of Sudden Infant Death Syndrome (SIDS) precautions. MOB stated she has all infant needs including a bassinet and car seat. MOB identified [place] for follow-up care and denies any barriers. MOB is already enrolled in [facility] and aware she can have infant added to benefits. MOB stated she has no additional needs at this time.   CSW identifies no further need for intervention and no barriers to discharge at this time.  Darra Lis, [employer] [phone]

## 2021-06-21 ENCOUNTER — Telehealth: Payer: Self-pay

## 2021-06-21 NOTE — Telephone Encounter (Signed)
Pt called stating she is cramping and ibuprofen 600mg  is not working. Advised pt to take tylenol with ibuprofen. Pt requesting hydrocodone, advised pt we do not typically prescribe narcotics for vaginal deliveries.  Per Dr. if pt is in that much pain she needs narcotics, she should report to MAU for evaluation.  Pt states she will try ibuprofen and tylenol at this time.

## 2021-06-26 ENCOUNTER — Encounter: Payer: Medicaid Other | Admitting: Family Medicine

## 2021-06-27 ENCOUNTER — Inpatient Hospital Stay (HOSPITAL_COMMUNITY): Payer: Medicaid Other

## 2021-06-27 ENCOUNTER — Inpatient Hospital Stay (HOSPITAL_COMMUNITY)
Admission: AD | Admit: 2021-06-27 | Payer: Medicaid Other | Source: Home / Self Care | Admitting: Obstetrics & Gynecology

## 2021-07-03 ENCOUNTER — Telehealth (HOSPITAL_COMMUNITY): Payer: Self-pay | Admitting: *Deleted

## 2021-07-03 NOTE — Telephone Encounter (Signed)
Left message to return nurse call.  Duffy Rhody, RN 07-03-2021 at 2:04pm

## 2021-07-17 ENCOUNTER — Other Ambulatory Visit: Payer: Self-pay

## 2021-07-17 ENCOUNTER — Ambulatory Visit (INDEPENDENT_AMBULATORY_CARE_PROVIDER_SITE_OTHER): Payer: Medicaid Other | Admitting: Obstetrics & Gynecology

## 2021-07-17 ENCOUNTER — Encounter: Payer: Self-pay | Admitting: Obstetrics & Gynecology

## 2021-07-17 DIAGNOSIS — O24410 Gestational diabetes mellitus in pregnancy, diet controlled: Secondary | ICD-10-CM | POA: Diagnosis not present

## 2021-07-17 NOTE — Addendum Note (Signed)
Addended by: Scheryl Marten on: 07/17/2021 10:07 AM   Modules accepted: Orders

## 2021-07-17 NOTE — Progress Notes (Addendum)
Post Partum Visit Note  Pamela Doyle is a 25 y.o. G90P2002 female who presents for a postpartum visit. She is 4 weeks postpartum following a normal spontaneous vaginal delivery.  I have fully reviewed the prenatal and intrapartum course, had A1GDM. The delivery was at [redacted]w[redacted]d gestational weeks.  Anesthesia: epidural. Postpartum course has been uncomplicated. Baby is doing well. Baby is feeding by bottle - Gerber Gentle . Bleeding no bleeding. Bowel function is normal. Bladder function is normal. Patient is sexually active. Contraception method is rhythm . Postpartum depression screening: negative.   The pregnancy intention screening data noted above was reviewed. Potential methods of contraception were discussed. The patient elected to proceed with No data recorded.   Edinburgh Postnatal Depression Scale - 07/17/21 0836       Edinburgh Postnatal Depression Scale:  In the Past 7 Days   I have been able to laugh and see the funny side of things. 0    I have looked forward with enjoyment to things. 0    I have blamed myself unnecessarily when things went wrong. 0    I have been anxious or worried for no good reason. 0    I have felt scared or panicky for no good reason. 0    Things have been getting on top of me. 0    I have been so unhappy that I have had difficulty sleeping. 0    I have felt sad or miserable. 0    I have been so unhappy that I have been crying. 0    The thought of harming myself has occurred to me. 0    Edinburgh Postnatal Depression Scale Total 0             Health Maintenance Due  Topic Date Due   HPV VACCINES (1 - 2-dose series) Never done   INFLUENZA VACCINE  06/18/2021    The following portions of the patient's history were reviewed and updated as appropriate: allergies, current medications, past family history, past medical history, past social history, past surgical history, and problem list.  Review of Systems Pertinent items noted in HPI and remainder  of comprehensive ROS otherwise negative.  Objective:  BP 120/78   Pulse 60   Ht 5\' 7"  (1.702 m)   Wt 214 lb (97.1 kg)   Breastfeeding No   BMI 33.52 kg/m    General:  alert and no distress   Breasts:  not indicated  Lungs: clear to auscultation bilaterally  Heart:  regular rate and rhythm  Abdomen: soft, non-tender; bowel sounds normal; no masses,  no organomegaly   GU exam:  not indicated       Assessment:   Normal postpartum exam.   Plan:   Essential components of care per ACOG recommendations:  1.  Mood and well being: Patient with negative depression screening today. Reviewed local resources for support.  - Patient tobacco use? No.   - hx of drug use? No.    2. Infant care and feeding:  -Patient currently breastmilk feeding? No.  -Social determinants of health (SDOH) reviewed in EPIC. No concerns.  3. Sexuality, contraception and birth spacing - Patient does not want a pregnancy in the next year.  Desired family size is 3 children.  - Reviewed forms of contraception in tiered fashion. Patient desired natural family planning (NFP) today.   - Discussed birth spacing of 18 months  4. Sleep and fatigue -Encouraged family/partner/community support of 4 hrs of uninterrupted sleep to  help with mood and fatigue  5. Physical Recovery  - Discussed patients delivery and complications. She describes her labor as good. - Patient had a Vaginal, no problems at delivery. Patient had no laceration. Patient expressed understanding - Patient has urinary incontinence? No. - Patient is safe to resume physical and sexual activity.  6.  Health Maintenance - HM due items addressed Yes - Last pap smear  Diagnosis  Date Value Ref Range Status  12/22/2019   Final   - Negative for intraepithelial lesion or malignancy (NILM)   Pap smear not done at today's visit.    7. Chronic Disease/Pregnancy Condition follow up: Gestational Diabetes - GTT today, will follow up results and manage  accordingly.   Jaynie Collins, MD Center for [employer], [facility]

## 2021-07-18 ENCOUNTER — Encounter: Payer: Self-pay | Admitting: Obstetrics & Gynecology

## 2021-07-18 LAB — GLUCOSE TOLERANCE, 2 HOURS
Glucose, 2 hour: 88 mg/dL (ref 65–139)
Glucose, GTT - Fasting: 85 mg/dL (ref 65–99)

## 2021-08-14 IMAGING — US US MFM OB FOLLOW-UP
1 series · 13 of 28 positions shown · non-contrast
Comparison: none

[Series 1: us mfm ob follow-up · 13 of 81 slices shown]
[im 3/81]
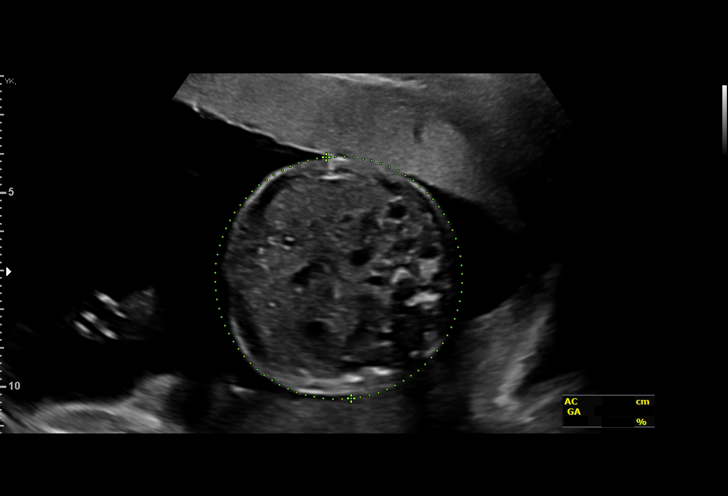
[im 9/81]
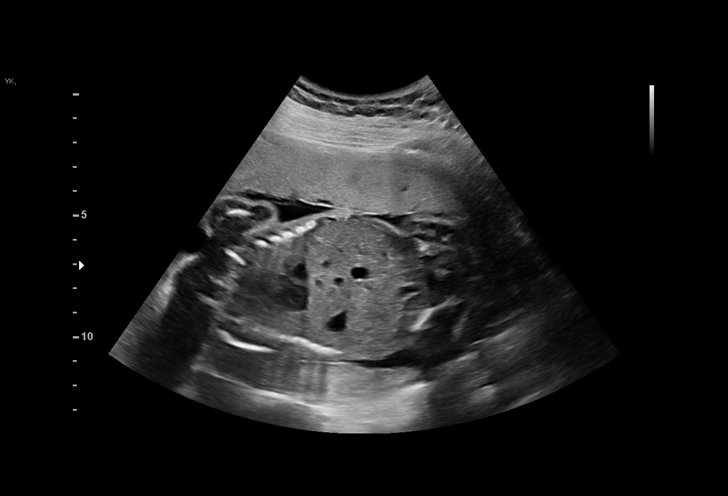
[im 15/81]
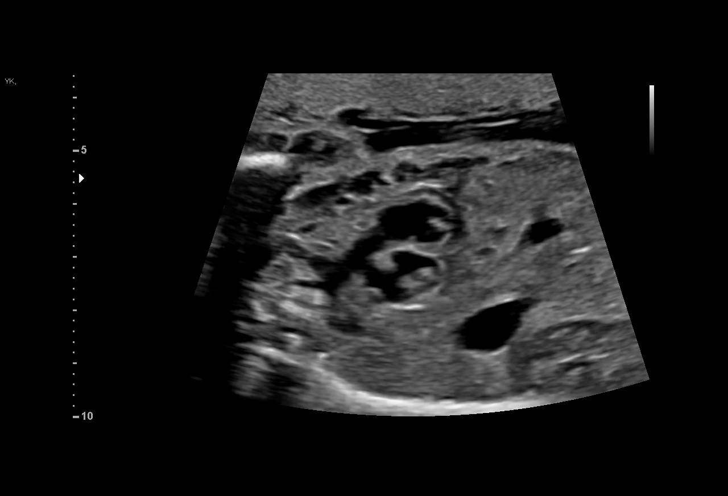
[im 21/81]
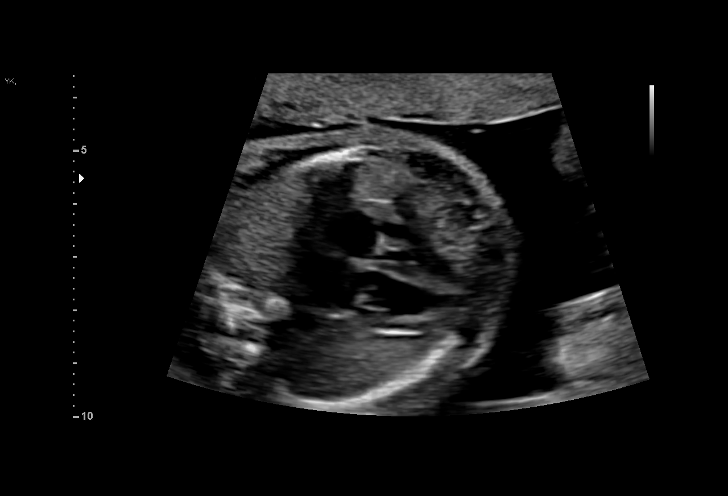
[im 27/81]
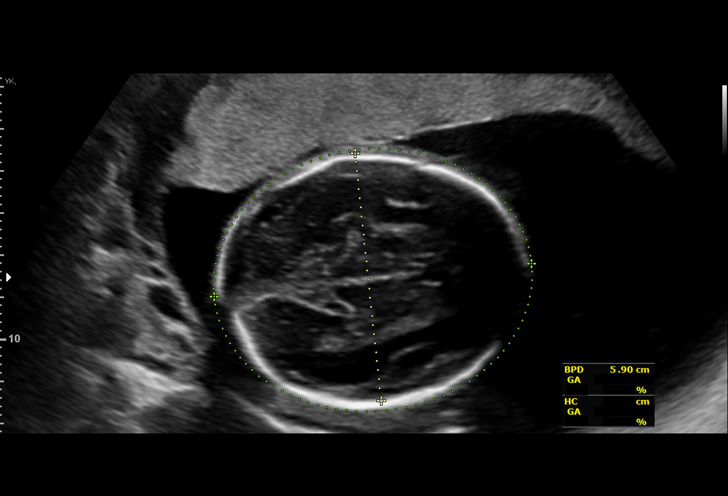
[im 33/81]
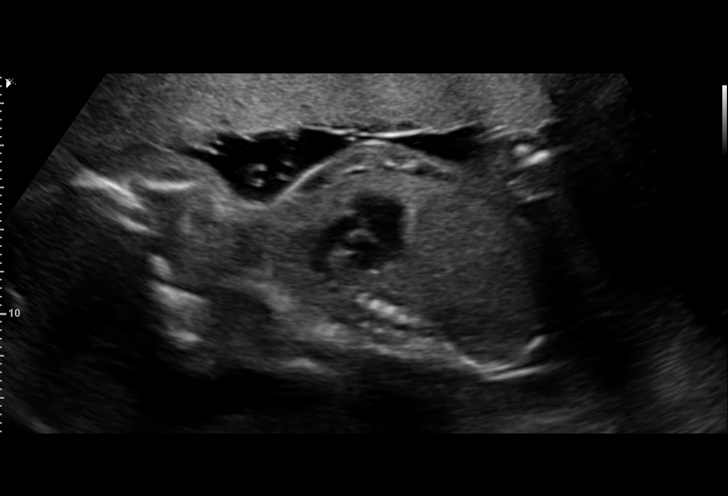
[im 42/81]
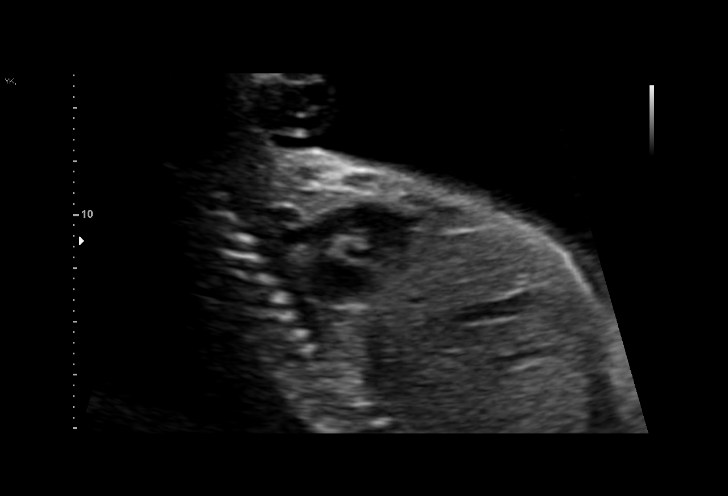
[im 48/81]
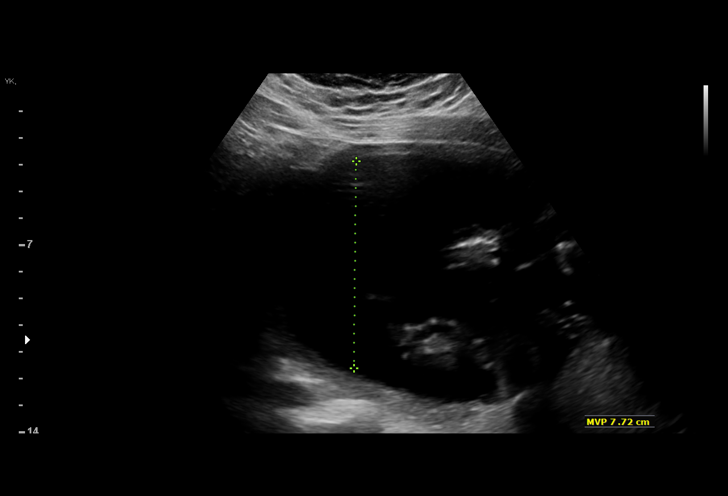
[im 54/81]
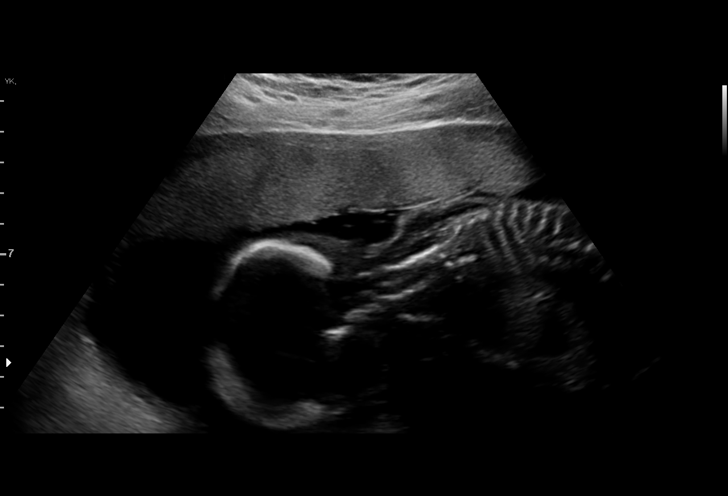
[im 60/81]
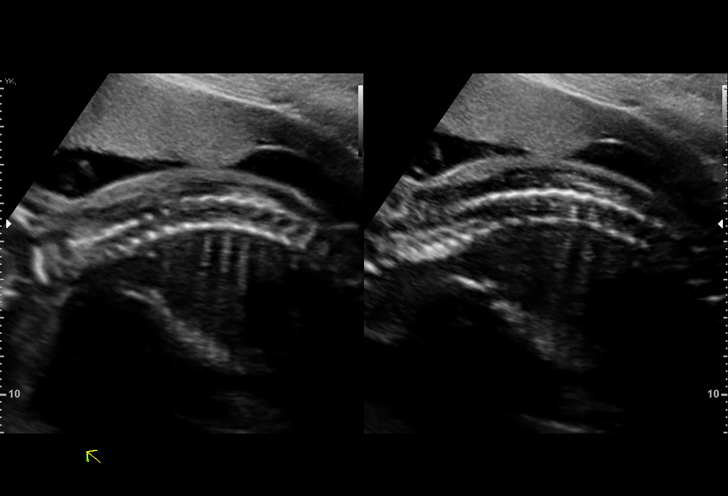
[im 66/81]
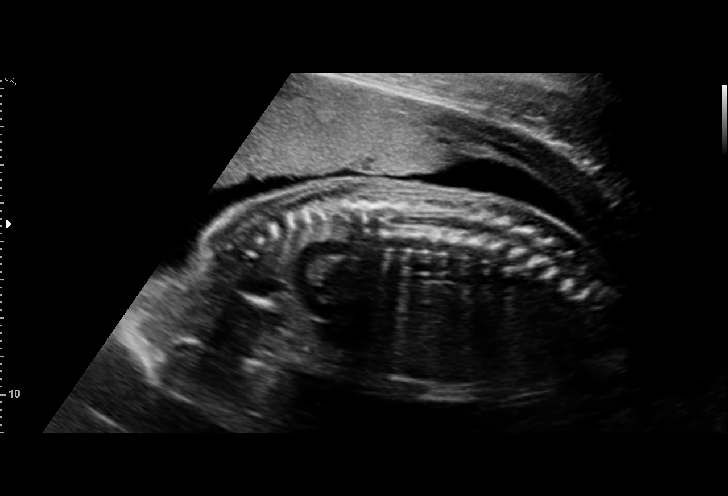
[im 72/81]
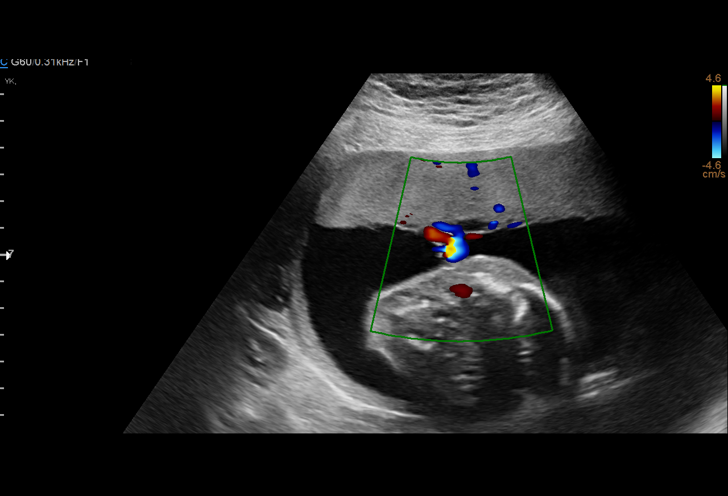
[im 78/81]
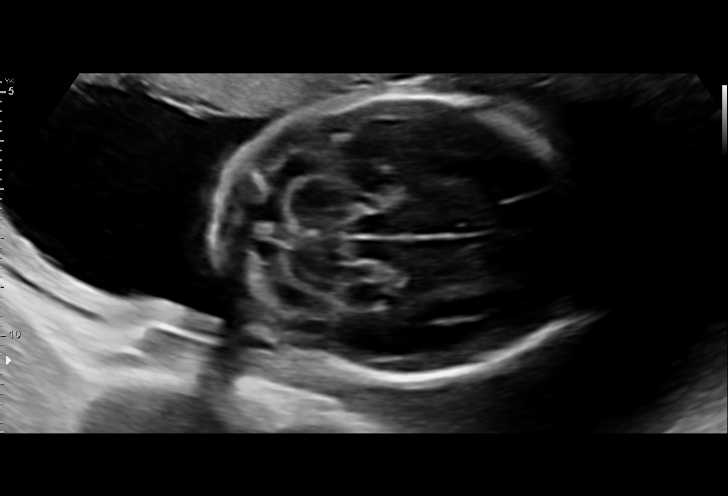

[13 of 28 positions shown; findings below may reference images not displayed]

----------------------------------------------------------------------

 ----------------------------------------------------------------------
Indications

  Antenatal follow-up for nonvisualized fetal
  anatomy
  Encounter for antenatal screening for
  malformations
  Cystic Fibrosis (CF) Carrier, second trimester
  23 weeks gestation of pregnancy
 ----------------------------------------------------------------------
Fetal Evaluation

 Num Of Fetuses:          1
 Fetal Heart Rate(bpm):   146
 Cardiac Activity:        Observed
 Presentation:            Variable
 Placenta:                Anterior
 P. Cord Insertion:       Visualized, central

 Amniotic Fluid
 AFI FV:      Within normal limits

                             Largest Pocket(cm)

Biometry

 BPD:      58.8  mm     G. Age:  24w 0d         82  %    CI:        73.51   %    70 - 86
                                                         FL/HC:       18.2  %    19.2 -
 HC:      217.9  mm     G. Age:  23w 6d         69  %    HC/AC:       1.10       1.05 -
 AC:      198.8  mm     G. Age:  24w 4d         86  %    FL/BPD:      67.5  %    71 - 87
 FL:       39.7  mm     G. Age:  22w 6d         32  %    FL/AC:       20.0  %    20 - 24
 HUM:      38.3  mm     G. Age:  23w 4d         53  %

 Est. FW:     630   gm     1 lb 6 oz     81  %
OB History
 Blood Type:    B+
 Gravidity:    1         Term:   0        Prem:   0        SAB:   0
 TOP:          0       Ectopic:  0        Living: 0
Gestational Age

 LMP:           23w 0d        Date:  10/05/19                 EDD:   07/11/20
 U/S Today:     23w 6d                                        EDD:   07/05/20
 Best:          23w 0d     Det. By:  LMP  (10/05/19)          EDD:   07/11/20
Anatomy

 Cranium:               Appears normal         LVOT:                   Previously seen
 Cavum:                 Appears normal         Aortic Arch:            Appears normal
 Ventricles:            Appears normal         Ductal Arch:            Appears normal
 Choroid Plexus:        Appears normal         Diaphragm:              Appears normal
 Cerebellum:            Appears normal         Stomach:                Appears normal, left
                                                                       sided
 Posterior Fossa:       Appears normal         Abdomen:                Appears normal
 Nuchal Fold:           Previously seen        Abdominal Wall:         Appears nml (cord
                                                                       insert, abd wall)
 Face:                  Orbits and profile     Cord Vessels:           Appears normal (3
                        previously seen                                vessel cord)
 Lips:                  Previously seen        Kidneys:                Appear normal
 Palate:                Not well visualized    Bladder:                Appears normal
 Thoracic:              Appears normal         Spine:                  Appears normal
 Heart:                 Appears normal         Upper Extremities:      Previously seen
                        (4CH, axis, and
                        situs)
 RVOT:                  Appears normal         Lower Extremities:      Previously seen

 Other:  Male gender. Heels and Left 5th digit previously visualized.
         Technically difficult due to fetal position.
Cervix Uterus Adnexa

 Cervix
 Normal appearance by transabdominal scan.

 Uterus
 No abnormality visualized.

 Cul De Sac
 No free fluid seen.

 Adnexa
 No abnormality visualized.
Impression

 Follow up anatomy performed
 Normal interval growth, anatomy completed today
 Good fetal movement and amniotic fluid
Recommendations

 Follow up as clinically indicated.

## 2021-10-17 ENCOUNTER — Telehealth: Payer: Self-pay

## 2021-10-17 ENCOUNTER — Encounter: Payer: Medicaid Other | Admitting: Licensed Clinical Social Worker

## 2021-10-17 NOTE — Telephone Encounter (Signed)
Spoke with the pt about her postpartum anxiety and referred her to [address], [place], [place] per Ms Verdie Mosher and explained to the pt that she can walk in and be treated per their guidelines and they will make recommendations based off their assessment. That is why her appt with Sue Lush was cancelled. Pt stated that she understood and that shed go to the address provided for future assistance

## 2021-10-17 NOTE — Telephone Encounter (Signed)
Called the pt and said Pamela Doyle I spoke with Dr Shawnie Pons and she stated that she can see you as we work up appt in Dec on the 14 @8a  please call or message back to confirm that you are able to make this appt if not her next available appt is in Jan. Thank you and have an amazing day

## 2021-10-31 ENCOUNTER — Other Ambulatory Visit: Payer: Self-pay

## 2021-10-31 ENCOUNTER — Encounter: Payer: Self-pay | Admitting: Family Medicine

## 2021-10-31 ENCOUNTER — Telehealth (INDEPENDENT_AMBULATORY_CARE_PROVIDER_SITE_OTHER): Payer: Medicaid Other | Admitting: Family Medicine

## 2021-10-31 DIAGNOSIS — F411 Generalized anxiety disorder: Secondary | ICD-10-CM | POA: Diagnosis not present

## 2021-10-31 HISTORY — DX: Generalized anxiety disorder: F41.1

## 2021-10-31 MED ORDER — HYDROXYZINE HCL 10 MG PO TABS: 10.0000 mg | ORAL_TABLET | Freq: Three times a day (TID) | ORAL | 0 refills | Status: DC | PRN

## 2021-10-31 MED ORDER — CITALOPRAM HYDROBROMIDE 20 MG PO TABS: 20.0000 mg | ORAL_TABLET | Freq: Every day | ORAL | 12 refills | Status: DC

## 2021-10-31 NOTE — Assessment & Plan Note (Signed)
Will start Celexa. Begin with half tab daily. If makes you sleepy, please take at night. If keeps you up, may take in the morning. After 5-7 days if tolerating, begin 1 full tablet. Usual timing of onset discussed, need for 2-3 months before all symptoms improve.  Referral to Integrated Behavioral health. Mindfulness, meditation, yoga, exercise and self care reviewed.

## 2021-10-31 NOTE — Progress Notes (Signed)
GYNECOLOGY VIRTUAL VISIT ENCOUNTER NOTE  Provider location: Center for [facility]   Patient location: Home  I connected with Pamela Doyle on 10/31/21 at  8:00 AM EST by MyChart Video Encounter and verified that I am speaking with the correct person using two identifiers.   I discussed the limitations, risks, security and privacy concerns of performing an evaluation and management service virtually and the availability of in person appointments. I also discussed with the patient that there may be a patient responsible charge related to this service. The patient expressed understanding and agreed to proceed.   History:  Pamela Doyle is a 25 y.o. G37P2002 female being evaluated today for anxiety after the birth of her last child. .Anxiety is worse, constant worry. Feels paranoid.  She is biting her nails and having ulcers in her mouth. Notes somethings after they happen like when driving like the light has turned green and she is not paying attention. Not sleeping well.  Minmal self care. Does not feel depressed.  Having some panic attacks. Will take her 10 minutes to get into the grocery store. Notes going to church and worrying that someone would come in an do harm to herself and her children. Denies SI or HI. Denies hallucinations. She denies any abnormal vaginal discharge, bleeding, pelvic pain or other concerns.       Past Medical History:  Diagnosis Date   Biological false positive RPR test 12/23/2019   Negative T. 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   GDM (gestational diabetes mellitus), class A1 04/13/2021   Normal postpartum 2 hr GTT   Past Surgical History:  Procedure Laterality Date   NO PAST SURGERIES     The following portions of the patient's history were reviewed and updated as appropriate: allergies, current medications, past family history, past medical history, past social  history, past surgical history and problem list.   Health Maintenance:  Normal pap and negative HRHPV on 12/22/2019.    Review of Systems:  Pertinent items noted in HPI and remainder of comprehensive ROS otherwise negative.  Physical Exam:   General:  Alert, oriented and cooperative. Patient appears to be in no acute distress.  Mental Status: Normal mood and affect. Normal behavior. Normal judgment and thought content.   Respiratory: Normal respiratory effort, no problems with respiration noted  Rest of physical exam deferred due to type of encounter     Assessment and Plan:     Problem List Items Addressed This Visit       Unprioritized   Generalized anxiety disorder - Primary    Will start Celexa. Begin with half tab daily. If makes you sleepy, please take at night. If keeps you up, may take in the morning. After 5-7 days if tolerating, begin 1 full tablet. Usual timing of onset discussed, need for 2-3 months before all symptoms improve.  Referral to Integrated Behavioral health. Mindfulness, meditation, yoga, exercise and self care reviewed.      Relevant Medications   citalopram (CELEXA) 20 MG tablet   hydrOXYzine (ATARAX) 10 MG tablet   Other Relevant Orders   Ambulatory referral to Integrated Behavioral Health         I discussed the assessment and treatment plan with the patient. The patient was provided an opportunity to ask questions and all were answered. The patient agreed with the plan and demonstrated an understanding of the instructions.  The patient was advised to call back or seek an in-person evaluation/go to the ED if the symptoms worsen or if the condition fails to improve as anticipated.  I provided 18 minutes of face-to-face time during this encounter.   Reva Bores, MD Center for [employer], [facility]

## 2021-11-05 ENCOUNTER — Telehealth: Payer: Self-pay | Admitting: Clinical

## 2021-11-05 NOTE — Telephone Encounter (Signed)
Attempt to schedule, per referral; Left HIPPA-compliant message to call back Mikaeel Petrow from [facility] at  [phone] (Vittorio Mohs's office); left MyChart message for pt. °

## 2021-11-22 ENCOUNTER — Other Ambulatory Visit: Payer: Self-pay | Admitting: Family Medicine

## 2021-11-22 DIAGNOSIS — F411 Generalized anxiety disorder: Secondary | ICD-10-CM

## 2022-02-18 ENCOUNTER — Ambulatory Visit: Payer: Medicaid Other | Admitting: Family Medicine

## 2022-06-26 ENCOUNTER — Encounter: Payer: Self-pay | Admitting: Family Medicine

## 2022-06-26 ENCOUNTER — Telehealth (INDEPENDENT_AMBULATORY_CARE_PROVIDER_SITE_OTHER): Payer: Medicaid Other | Admitting: Family Medicine

## 2022-06-26 DIAGNOSIS — F411 Generalized anxiety disorder: Secondary | ICD-10-CM

## 2022-06-26 DIAGNOSIS — N643 Galactorrhea not associated with childbirth: Secondary | ICD-10-CM | POA: Diagnosis not present

## 2022-06-26 MED ORDER — FLUOXETINE HCL 20 MG PO CAPS: 20.0000 mg | ORAL_CAPSULE | Freq: Every day | ORAL | 3 refills | Status: DC

## 2022-06-26 NOTE — Assessment & Plan Note (Addendum)
Will try prozac. Usual onset of effectiveness, usual dosing needed and side effects reviewed. Refer to [facility] for possible psych referral for alternatives to SSRI or SNRIs as she does not seem to tolerate. Has component of OCD and would need this. Discussed self care and exercise and good sleep.

## 2022-06-26 NOTE — Progress Notes (Signed)
Follow up on anxiety, would like to change meds

## 2022-06-26 NOTE — Progress Notes (Signed)
GYNECOLOGY VIRTUAL VISIT ENCOUNTER NOTE  Provider location: Center for [facility]   Patient location: Home  I connected with [name] on 06/26/22 at  8:15 AM EDT by MyChart Video Encounter and verified that I am speaking with the correct person using two identifiers.   I discussed the limitations, risks, security and privacy concerns of performing an evaluation and management service virtually and the availability of in person appointments. I also discussed with the patient that there may be a patient responsible charge related to this service. The patient expressed understanding and agreed to proceed.   History:  Pamela Doyle is a 26 y.o. [phone] female being evaluated today for anxiety--we previously started Celexa. States she is some better but still feels very anxious with it. She is grinding her teeth in her sleep. Having a lot of mouth pain.  She is attributing this to the Celexa. Reports previously on Zoloft and this gave her headaches. She would like to try something else. She also wants another baby soon. She reports stopping breast feeding but ongoing breast leaking.  Denies problems with cycles. She denies any abnormal vaginal discharge, bleeding, pelvic pain or other concerns.       Past Medical History:  Diagnosis Date   Anemia    Biological false positive RPR test 12/23/2019   Negative T. 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   Health Maintenance:  Normal pap on 12/22/2019.  Marland Kitchen   Review of  Systems:  Pertinent items noted in HPI and remainder of comprehensive ROS otherwise negative.  Physical Exam:   General:  Alert, oriented and cooperative. Patient appears to be in no acute distress.  Mental Status: Normal mood and affect. Normal behavior. Normal judgment and thought content.   Respiratory: Normal respiratory effort, no problems with respiration noted  Rest of physical exam deferred due to type of encounter  Labs and Imaging No results found for this or any previous visit (from the past 336 hour(s)). No results found.     Assessment and Plan:     Problem List Items Addressed This Visit       Unprioritized   Generalized anxiety disorder    Will try prozac. Usual onset of effectiveness, usual dosing needed and side effects reviewed. Refer to [facility] for possible psych referral for alternatives to SSRI or SNRIs as she does not seem to tolerate. Has component of OCD and would need this. Discussed self care and exercise and good sleep.      Relevant Medications   FLUoxetine (PROZAC) 20 MG capsule   Other Relevant Orders   Ambulatory referral to Integrated Behavioral Health   Other Visit Diagnoses     Galactorrhea    -  Primary   Will check fasting prolactin level. Future lab put in.   Relevant Orders   Prolactin            I discussed the assessment and treatment plan with the patient. The patient was provided an opportunity to ask questions and all  were answered. The patient agreed with the plan and demonstrated an understanding of the instructions.   The patient was advised to call back or seek an in-person evaluation/go to the ED if the symptoms worsen or if the condition fails to improve as anticipated.  I provided 22 minutes of face-to-face time during this encounter.   Reva Bores, MD Center for [employer], [facility]

## 2022-07-01 ENCOUNTER — Telehealth: Payer: Self-pay | Admitting: Clinical

## 2022-07-01 NOTE — Telephone Encounter (Signed)
Attempt call regarding referral; Left HIPPA-compliant message to call back Brianny Soulliere from [facility] at  [phone] (Mykenzie Ebanks's office).    

## 2022-07-15 NOTE — BH Specialist Note (Deleted)
Integrated Behavioral Health via Telemedicine Visit  07/15/2022 Pamela Doyle [id number]  Number of Integrated Behavioral Health Clinician visits: No data recorded Session Start time: No data recorded  Session End time: No data recorded Total time in minutes: No data recorded  Referring Provider: *** Patient/Family location: *** [facility] Provider location: *** All persons participating in visit: *** Types of Service: {CHL AMB TYPE OF SERVICE:[phone]}  I connected with Pamela Doyle and/or Pamela Doyle {family members:20773} via  Telephone or Video Enabled Telemedicine Application  (Video is Caregility application) and verified that I am speaking with the correct person using two identifiers. Discussed confidentiality: {YES/NO:21197}  I discussed the limitations of telemedicine and the availability of in person appointments.  Discussed there is a possibility of technology failure and discussed alternative modes of communication if that failure occurs.  I discussed that engaging in this telemedicine visit, they consent to the provision of behavioral healthcare and the services will be billed under their insurance.  Patient and/or legal guardian expressed understanding and consented to Telemedicine visit: {YES/NO:21197}  Presenting Concerns: Patient and/or family reports the following symptoms/concerns: *** Duration of problem: ***; Severity of problem: {Mild/Moderate/Severe:20260}  Patient and/or Family's Strengths/Protective Factors: {CHL AMB BH PROTECTIVE FACTORS:[phone]}  Goals Addressed: Patient will:  Reduce symptoms of: {IBH Symptoms:21014056}   Increase knowledge and/or ability of: {IBH Patient Tools:21014057}   Demonstrate ability to: {IBH Goals:21014053}  Progress towards Goals: {CHL AMB BH PROGRESS TOWARDS GOALS:[phone]}  Interventions: Interventions utilized:  {IBH Interventions:21014054} Standardized Assessments completed: {IBH Screening  Tools:21014051}  Patient and/or Family Response: ***  Assessment: Patient currently experiencing ***.   Patient may benefit from ***.  Plan: Follow up with behavioral health clinician on : *** Behavioral recommendations: *** Referral(s): {IBH Referrals:21014055}  I discussed the assessment and treatment plan with the patient and/or parent/guardian. They were provided an opportunity to ask questions and all were answered. They agreed with the plan and demonstrated an understanding of the instructions.   They were advised to call back or seek an in-person evaluation if the symptoms worsen or if the condition fails to improve as anticipated.  Valetta Close Georgenia Salim, LCSW

## 2022-10-28 ENCOUNTER — Encounter: Payer: Self-pay | Admitting: Family Medicine

## 2022-11-27 ENCOUNTER — Ambulatory Visit (INDEPENDENT_AMBULATORY_CARE_PROVIDER_SITE_OTHER): Payer: Medicaid Other

## 2022-11-27 ENCOUNTER — Ambulatory Visit (INDEPENDENT_AMBULATORY_CARE_PROVIDER_SITE_OTHER): Payer: Medicaid Other | Admitting: *Deleted

## 2022-11-27 VITALS — BP 126/74 | HR 80 | Wt 206.0 lb

## 2022-11-27 DIAGNOSIS — O3680X0 Pregnancy with inconclusive fetal viability, not applicable or unspecified: Secondary | ICD-10-CM

## 2022-11-27 DIAGNOSIS — Z3A01 Less than 8 weeks gestation of pregnancy: Secondary | ICD-10-CM | POA: Diagnosis not present

## 2022-11-27 NOTE — Progress Notes (Signed)
New OB Intake  I explained I am completing New OB Intake today. . Pt is G3/P2. LMP 09/28/22. I reviewed her allergies, medications, Medical/Surgical/OB history, and appropriate screenings.   Patient Active Problem List   Diagnosis Date Noted   Generalized anxiety disorder 10/31/2021    Concerns addressed today    Patient informed that the ultrasound is considered a limited obstetric ultrasound and is not intended to be a complete ultrasound exam.  Patient also informed that the ultrasound is not being completed with the intent of assessing for fetal or placental anomalies or any pelvic abnormalities. Explained that the purpose of today's ultrasound is to assess for dating and fetal heart rate.  Patient acknowledges the purpose of the exam and the limitations of the study.      Ultrasound today was inconclusive. Will have pt return in 7 days for repeat scan.    Crosby Oyster, RN 11/27/2022  3:17 PM

## 2022-11-28 ENCOUNTER — Encounter: Payer: Self-pay | Admitting: Family Medicine

## 2022-12-04 ENCOUNTER — Encounter: Payer: Self-pay | Admitting: Family Medicine

## 2022-12-04 ENCOUNTER — Ambulatory Visit (INDEPENDENT_AMBULATORY_CARE_PROVIDER_SITE_OTHER): Payer: Medicaid Other

## 2022-12-04 ENCOUNTER — Ambulatory Visit (INDEPENDENT_AMBULATORY_CARE_PROVIDER_SITE_OTHER): Payer: Medicaid Other | Admitting: Family Medicine

## 2022-12-04 VITALS — BP 135/80 | HR 69

## 2022-12-04 DIAGNOSIS — Z3A01 Less than 8 weeks gestation of pregnancy: Secondary | ICD-10-CM

## 2022-12-04 DIAGNOSIS — O3680X0 Pregnancy with inconclusive fetal viability, not applicable or unspecified: Secondary | ICD-10-CM | POA: Diagnosis not present

## 2022-12-04 DIAGNOSIS — O034 Incomplete spontaneous abortion without complication: Secondary | ICD-10-CM | POA: Insufficient documentation

## 2022-12-04 MED ORDER — MISOPROSTOL 200 MCG PO TABS: 1000.0000 ug | ORAL_TABLET | Freq: Once | ORAL | 1 refills | Status: DC

## 2022-12-04 NOTE — Progress Notes (Signed)
   Subjective:    Patient ID: Pamela Doyle is a 27 y.o. female presenting with repeat scan   on 12/04/2022  HPI: Patient is here for f/u u/s. Since last visit which showed 2 yolk sacs and no fetal poles, she has had spotting and cramping. Bleeding became worse this am.  Review of Systems  Constitutional:  Negative for chills and fever.  Respiratory:  Negative for shortness of breath.   Cardiovascular:  Negative for chest pain.  Gastrointestinal:  Negative for abdominal pain, nausea and vomiting.  Genitourinary:  Positive for vaginal bleeding. Negative for dysuria.  Skin:  Negative for rash.      Objective:    BP 135/80   Pulse 69   LMP 09/28/2022  Physical Exam Exam conducted with a chaperone present.  Constitutional:      General: She is not in acute distress.    Appearance: She is well-developed.  HENT:     Head: Normocephalic and atraumatic.  Eyes:     General: No scleral icterus. Cardiovascular:     Rate and Rhythm: Normal rate.  Pulmonary:     Effort: Pulmonary effort is normal.  Abdominal:     Palpations: Abdomen is soft.  Musculoskeletal:     Cervical back: Neck supple.  Skin:    General: Skin is warm and dry.  Neurological:     Mental Status: She is alert and oriented to person, place, and time.    TVUS continues to show debris in the uterus with 2 yolk sacs, one much larger than the other, no fetal pole and mostly unchanged from last week.     Assessment & Plan:   Problem List Items Addressed This Visit       Unprioritized   Incomplete abortion    Discussed common causes at early gestational age, possible genetics investigation, and management options including R/B/A of expectant management, Cytotec and D & E. Blood type is B pos.  She declines genetics and would like to attempt cytotec. Bleeding precautions given.       Relevant Medications   misoprostol (CYTOTEC) 200 MCG tablet   Other Visit Diagnoses     Encounter to determine fetal  viability of pregnancy, single or unspecified fetus    -  Primary   Relevant Orders   US OB Limited      No follow-ups on file.  Donnamae Jude, MD 12/04/2022 10:37 AM

## 2022-12-04 NOTE — Progress Notes (Signed)
Patient informed that the ultrasound is considered a limited obstetric ultrasound and is not intended to be a complete ultrasound exam.  Patient also informed that the ultrasound is not being completed with the intent of assessing for fetal or placental anomalies or any pelvic abnormalities. Explained that the purpose of today's ultrasound is to assess for fetal heart rate.  Patient acknowledges the purpose of the exam and the limitations of the study.         

## 2022-12-04 NOTE — Assessment & Plan Note (Signed)
Discussed common causes at early gestational age, possible genetics investigation, and management options including R/B/A of expectant management, Cytotec and D & E. Blood type is B pos.  She declines genetics and would like to attempt cytotec. Bleeding precautions given.

## 2022-12-17 ENCOUNTER — Encounter: Payer: Self-pay | Admitting: *Deleted

## 2022-12-18 ENCOUNTER — Ambulatory Visit (INDEPENDENT_AMBULATORY_CARE_PROVIDER_SITE_OTHER): Payer: Medicaid Other

## 2022-12-18 ENCOUNTER — Ambulatory Visit: Payer: Medicaid Other | Admitting: Family Medicine

## 2022-12-18 VITALS — BP 115/76 | HR 64

## 2022-12-18 DIAGNOSIS — Z3A00 Weeks of gestation of pregnancy not specified: Secondary | ICD-10-CM

## 2022-12-18 DIAGNOSIS — O034 Incomplete spontaneous abortion without complication: Secondary | ICD-10-CM | POA: Diagnosis not present

## 2022-12-18 DIAGNOSIS — O039 Complete or unspecified spontaneous abortion without complication: Secondary | ICD-10-CM

## 2022-12-18 NOTE — Progress Notes (Signed)
   Subjective:    Patient ID: Pamela Doyle is a 27 y.o. female presenting with Follow-up (SAB)  on 12/18/2022  HPI: Took cytotec and had cramping and bleeding. Still having some. Noted 3 of 5 tablets in the toilet the next am.  Review of Systems  Constitutional:  Negative for chills and fever.  Respiratory:  Negative for shortness of breath.   Cardiovascular:  Negative for chest pain.  Gastrointestinal:  Negative for abdominal pain, nausea and vomiting.  Genitourinary:  Positive for pelvic pain and vaginal bleeding. Negative for dysuria.  Skin:  Negative for rash.      Objective:    BP 115/76   Pulse 64   LMP 09/28/2022  Physical Exam Exam conducted with a chaperone present.  Constitutional:      General: She is not in acute distress.    Appearance: She is well-developed.  HENT:     Head: Normocephalic and atraumatic.  Eyes:     General: No scleral icterus. Cardiovascular:     Rate and Rhythm: Normal rate.  Pulmonary:     Effort: Pulmonary effort is normal.  Abdominal:     Palpations: Abdomen is soft.  Genitourinary:    Comments: BUS normal, vagina is pink and rugated  Musculoskeletal:     Cervical back: Neck supple.  Skin:    General: Skin is warm and dry.  Neurological:     Mental Status: She is alert and oriented to person, place, and time.    TVUS - reveals thin stripe at fundus with debris in the LUS and cervix with + color on Doppler.     Assessment & Plan:  . Problem List Items Addressed This Visit       Unprioritized   Incomplete abortion    Still present--repeat Cytotec--repeat u/s in 2 weeks      Other Visit Diagnoses     Spontaneous abortion    -  Primary   Relevant Orders   US OB Limited       Return in about 6 weeks (around 01/29/2023).  Donnamae Jude, MD 12/18/2022 11:06 AM

## 2022-12-18 NOTE — Progress Notes (Signed)
Having some bleeding still and passing some small clots

## 2022-12-18 NOTE — Assessment & Plan Note (Signed)
Still present--repeat Cytotec--repeat u/s in 2 weeks

## 2022-12-24 ENCOUNTER — Other Ambulatory Visit: Payer: Self-pay | Admitting: *Deleted

## 2022-12-24 DIAGNOSIS — O034 Incomplete spontaneous abortion without complication: Secondary | ICD-10-CM

## 2022-12-24 MED ORDER — MISOPROSTOL 200 MCG PO TABS: 1000.0000 ug | ORAL_TABLET | Freq: Once | ORAL | 0 refills | Status: DC

## 2023-01-01 ENCOUNTER — Ambulatory Visit (INDEPENDENT_AMBULATORY_CARE_PROVIDER_SITE_OTHER): Payer: Medicaid Other

## 2023-01-01 ENCOUNTER — Ambulatory Visit: Payer: Medicaid Other | Admitting: Family Medicine

## 2023-01-01 VITALS — BP 125/81 | HR 73

## 2023-01-01 DIAGNOSIS — O021 Missed abortion: Secondary | ICD-10-CM

## 2023-01-01 DIAGNOSIS — O039 Complete or unspecified spontaneous abortion without complication: Secondary | ICD-10-CM | POA: Diagnosis not present

## 2023-01-01 DIAGNOSIS — O034 Incomplete spontaneous abortion without complication: Secondary | ICD-10-CM

## 2023-01-01 DIAGNOSIS — Z3A00 Weeks of gestation of pregnancy not specified: Secondary | ICD-10-CM | POA: Diagnosis not present

## 2023-01-01 NOTE — Assessment & Plan Note (Signed)
Complete now--usual bleeding profile discussed.

## 2023-01-01 NOTE — Progress Notes (Signed)
    Subjective:    Patient ID: Pamela Doyle is a 27 y.o. female presenting with Follow-up  on 01/01/2023  HPI: Patient is here to f/u from prior SAB. Took cytotec again. Stopped bleeding x 3 days, but returned this am. No pain, no fever, no chills.  Review of Systems  Constitutional:  Negative for chills and fever.  Respiratory:  Negative for shortness of breath.   Cardiovascular:  Negative for chest pain.  Gastrointestinal:  Negative for abdominal pain, nausea and vomiting.  Genitourinary:  Negative for dysuria.  Skin:  Negative for rash.      Objective:    BP 125/81   Pulse 73  Physical Exam Exam conducted with a chaperone present.  Constitutional:      General: She is not in acute distress.    Appearance: She is well-developed.  HENT:     Head: Normocephalic and atraumatic.  Eyes:     General: No scleral icterus. Cardiovascular:     Rate and Rhythm: Normal rate.  Pulmonary:     Effort: Pulmonary effort is normal.  Abdominal:     Palpations: Abdomen is soft.  Genitourinary:    General: Normal vulva.  Musculoskeletal:     Cervical back: Neck supple.  Skin:    General: Skin is warm and dry.  Neurological:     Mental Status: She is alert and oriented to person, place, and time.    TVUS reveals thin stripe 5 mm.     Assessment & Plan:   Problem List Items Addressed This Visit       Unprioritized   Incomplete abortion    Complete now--usual bleeding profile discussed.      Other Visit Diagnoses     Spontaneous abortion    -  Primary   Relevant Orders   US OB Limited       Return if symptoms worsen or fail to improve.  Donnamae Jude, MD 01/01/2023 11:14 AM

## 2023-01-01 NOTE — Progress Notes (Signed)
Patient informed that the ultrasound is considered a limited obstetric ultrasound and is not intended to be a complete ultrasound exam.  Patient also informed that the ultrasound is not being completed with the intent of assessing for fetal or placental anomalies or any pelvic abnormalities. Explained that the purpose of today's ultrasound is to assess for products of conceptiopn.  Patient acknowledges the purpose of the exam and the limitations of the study.

## 2023-01-23 ENCOUNTER — Ambulatory Visit (INDEPENDENT_AMBULATORY_CARE_PROVIDER_SITE_OTHER): Payer: Medicaid Other | Admitting: Advanced Practice Midwife

## 2023-01-23 ENCOUNTER — Other Ambulatory Visit (HOSPITAL_COMMUNITY)
Admission: RE | Admit: 2023-01-23 | Discharge: 2023-01-23 | Disposition: A | Discharge status: Home / Self Care | Payer: Medicaid Other | Source: Ambulatory Visit | Attending: Advanced Practice Midwife | Admitting: Advanced Practice Midwife

## 2023-01-23 ENCOUNTER — Encounter: Payer: Self-pay | Admitting: Advanced Practice Midwife

## 2023-01-23 VITALS — BP 139/80 | HR 76 | Wt 206.0 lb

## 2023-01-23 DIAGNOSIS — Z01419 Encounter for gynecological examination (general) (routine) without abnormal findings: Secondary | ICD-10-CM | POA: Insufficient documentation

## 2023-01-23 DIAGNOSIS — O926 Galactorrhea: Secondary | ICD-10-CM

## 2023-01-23 NOTE — Progress Notes (Signed)
GYNECOLOGY ANNUAL PREVENTATIVE CARE ENCOUNTER NOTE  History:     Pamela Doyle is a 27 y.o. G85P2002 female here for a routine annual gynecologic exam.  Current complaints: persistent leaking of milk from right nipple. Episodic but has happened since she gave birth 06/2021. No purulent discharge. Denies pain. Denies abnormal vaginal bleeding, discharge, pelvic pain, problems with intercourse or other gynecologic concerns.   Patient is newly recovering from early pregnancy loss and denies concerns related to her emotional or physical recovery.    Gynecologic History Patient's last menstrual period was 01/18/2023. Contraception: none Last Pap: 12/2019. Result was normal with negative HPV Last Mammogram: N/A .   Obstetric History OB History  Gravida Para Term Preterm AB Living  '3 2 2     2  '$ SAB IAB Ectopic Multiple Live Births        0 2    # Outcome Date GA Lbr Len/2nd Weight Sex Delivery Anes PTL Lv  3 Gravida           2 Term 06/19/21 57w0d16:45 / 00:16 7 lb 8.6 oz (3.419 kg) M Vag-Spont EPI  LIV     Birth Comments: wnl  1 Term 07/14/20 436w3d2:10 / 02:11 8 lb 5.5 oz (3.785 kg) M Vag-Spont EPI  LIV     Birth Comments: wnl    Past Medical History:  Diagnosis Date   Anemia    Biological false positive RPR test 12/23/2019   Negative T. 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     cyanocobalamin (VITAMIN B12) 100 MCG tablet Take 100 mcg by mouth daily.     docusate sodium (COLACE) 50 MG capsule Take 50 mg by mouth 2 (two) times daily.     ferrous sulfate 325 (65 FE) MG EC tablet Take 325  mg by mouth 3 (three) times daily with meals.     FLUoxetine (PROZAC) 20 MG capsule Take 1 capsule (20 mg total) by mouth daily. 90 capsule 3   No current facility-administered medications on file prior to visit.    No Known Allergies  Social History:  reports that she has never smoked. She has never used smokeless tobacco. She reports that she does not currently use alcohol. She reports that she does not currently use drugs.  Family History  Adopted: Yes  Problem Relation Age of Onset   Healthy Mother    Drug abuse Father    Breast cancer Maternal Grandmother     The following portions of the patient's history were reviewed and updated as appropriate: allergies, current medications, past family history, past medical history, past social history, past surgical history and problem list.  Review of Systems Pertinent items noted in HPI and remainder of comprehensive ROS otherwise negative.  Physical Exam:  BP 139/80   Pulse 76   Wt 206 lb (93.4 kg)   LMP 01/18/2023   BMI 32.26 kg/m  CONSTITUTIONAL: Well-developed, well-nourished female in no acute distress.  HENT:  Normocephalic, atraumatic, External right and left ear normal.  EYES: Conjunctivae and  EOM are normal. Pupils are equal, round, and reactive to light. No scleral icterus.  SKIN: Skin is warm and dry. No rash noted. Not diaphoretic. No erythema. No pallor. MUSCULOSKELETAL: Normal range of motion. No tenderness.  No cyanosis, clubbing, or edema. NEUROLOGIC: Alert and oriented to person, place, and time. Normal muscle tone coordination.  PSYCHIATRIC: Normal mood and affect. Normal behavior. Normal judgment and thought content. CARDIOVASCULAR: Normal heart rate noted, regular rhythm RESPIRATORY: Clear to auscultation bilaterally. Effort and breath sounds normal, no problems with respiration noted. BREASTS: Symmetric in size. No masses, tenderness, skin changes, nipple drainage, or lymphadenopathy bilaterally. Performed in  the presence of a chaperone. ABDOMEN: Soft, no distention noted.  No tenderness, rebound or guarding.  PELVIC: Normal appearing external genitalia and urethral meatus; normal appearing vaginal mucosa and cervix.  No abnormal vaginal discharge noted.  Pap smear obtained.  Performed in the presence of a chaperone.   Assessment and Plan:    1. Well woman exam with routine gynecological exam - No concerning findings on exam or in conversation with patient - Cytology - PAP  2. Nipple discharge in antepartum period - No discharge on physical exam or with gentle pressure - No co-occurring acute symptoms - Can continue to monitor or proceed with breast ultrasound - Patient elects monitoring  Will follow up results of pap smear and manage accordingly.  Mallie Snooks, [place], MSN, CNM Certified Nurse Midwife, Product/process development scientist for [employer], [employer]

## 2023-01-27 LAB — CYTOLOGY - PAP
Adequacy: ABSENT
Comment: NEGATIVE
Diagnosis: NEGATIVE
High risk HPV: NEGATIVE

## 2023-03-11 ENCOUNTER — Encounter: Payer: Self-pay | Admitting: Advanced Practice Midwife

## 2023-03-12 ENCOUNTER — Encounter: Payer: Self-pay | Admitting: Family Medicine

## 2023-03-12 ENCOUNTER — Ambulatory Visit: Payer: Medicaid Other | Admitting: Family Medicine

## 2023-03-12 VITALS — BP 118/82 | HR 76 | Wt 205.0 lb

## 2023-03-12 DIAGNOSIS — N643 Galactorrhea not associated with childbirth: Secondary | ICD-10-CM | POA: Diagnosis not present

## 2023-03-12 DIAGNOSIS — F411 Generalized anxiety disorder: Secondary | ICD-10-CM

## 2023-03-12 MED ORDER — FLUOXETINE HCL 40 MG PO CAPS: 40.0000 mg | ORAL_CAPSULE | Freq: Every day | ORAL | 3 refills | Status: DC

## 2023-03-12 NOTE — Progress Notes (Signed)
   Subjective:    Patient ID: Pamela Doyle is a 27 y.o. female presenting with No chief complaint on file.  on 03/12/2023  HPI: Drainage on breast. Has been leaking x 3 years since breast fed first baby. Miscarriage in January. Sharp shooting pain in her breast. Breasts have been itchy lately.  No masses. Also notes worsening anxiety. On Prozac 20 mg.  Review of Systems  Constitutional:  Negative for chills and fever.  Respiratory:  Negative for shortness of breath.   Cardiovascular:  Negative for chest pain.  Gastrointestinal:  Negative for abdominal pain, nausea and vomiting.  Genitourinary:  Negative for dysuria.  Skin:  Negative for rash.      Objective:    BP 118/82   Pulse 76   Wt 205 lb (93 kg)   LMP 02/17/2023 (Exact Date)   BMI 32.11 kg/m  Physical Exam Exam conducted with a chaperone present.  Constitutional:      General: She is not in acute distress.    Appearance: She is well-developed.  HENT:     Head: Normocephalic and atraumatic.  Eyes:     General: No scleral icterus. Cardiovascular:     Rate and Rhythm: Normal rate.  Pulmonary:     Effort: Pulmonary effort is normal.  Chest:  Breasts:    Right: Nipple discharge (white) present. No bleeding, inverted nipple or mass.     Left: No bleeding, inverted nipple or mass.  Abdominal:     Palpations: Abdomen is soft.  Musculoskeletal:     Cervical back: Neck supple.  Skin:    General: Skin is warm and dry.  Neurological:     Mental Status: She is alert and oriented to person, place, and time.         Assessment & Plan:   Problem List Items Addressed This Visit       Unprioritized   Generalized anxiety disorder    Increased to 40 mg      Relevant Medications   FLUoxetine (PROZAC) 40 MG capsule   Galactorrhea - Primary    Check PRL. No masses. Bloody discharge is the only one to be nervous about. Wear supportive bra always. Avoid nipple stim.      Relevant Orders   Prolactin    Return if  symptoms worsen or fail to improve.  Reva Bores, MD 03/12/2023 11:11 AM

## 2023-03-12 NOTE — Progress Notes (Signed)
Pt has been having nipple discharge for a while now per pt reports discomfort from breast off/on.  Notes red mark on left breast yet drainage is only from right.  Drainage is sometimes milky or white denies any bleeding from breast.   SAB in 11/2022

## 2023-03-12 NOTE — Assessment & Plan Note (Signed)
Check PRL. No masses. Bloody discharge is the only one to be nervous about. Wear supportive bra always. Avoid nipple stim.

## 2023-03-12 NOTE — Assessment & Plan Note (Signed)
Increased to 40mg.

## 2023-03-13 LAB — PROLACTIN: Prolactin: 16.1 ng/mL (ref 4.8–33.4)

## 2023-05-15 DIAGNOSIS — R21 Rash and other nonspecific skin eruption: Secondary | ICD-10-CM | POA: Diagnosis not present

## 2023-05-15 DIAGNOSIS — L500 Allergic urticaria: Secondary | ICD-10-CM | POA: Diagnosis not present

## 2023-05-26 ENCOUNTER — Other Ambulatory Visit: Payer: Self-pay

## 2023-05-26 DIAGNOSIS — N912 Amenorrhea, unspecified: Secondary | ICD-10-CM | POA: Diagnosis not present

## 2023-05-27 LAB — BETA HCG QUANT (REF LAB): hCG Quant: 5097 m[IU]/mL

## 2023-06-11 ENCOUNTER — Ambulatory Visit: Payer: BC Managed Care – PPO | Admitting: *Deleted

## 2023-06-11 ENCOUNTER — Other Ambulatory Visit (INDEPENDENT_AMBULATORY_CARE_PROVIDER_SITE_OTHER): Payer: BC Managed Care – PPO

## 2023-06-11 VITALS — BP 130/79 | HR 71

## 2023-06-11 DIAGNOSIS — O3680X0 Pregnancy with inconclusive fetal viability, not applicable or unspecified: Secondary | ICD-10-CM

## 2023-06-11 DIAGNOSIS — Z3481 Encounter for supervision of other normal pregnancy, first trimester: Secondary | ICD-10-CM | POA: Diagnosis not present

## 2023-06-11 DIAGNOSIS — F411 Generalized anxiety disorder: Secondary | ICD-10-CM

## 2023-06-11 DIAGNOSIS — Z3A01 Less than 8 weeks gestation of pregnancy: Secondary | ICD-10-CM

## 2023-06-11 DIAGNOSIS — Z3480 Encounter for supervision of other normal pregnancy, unspecified trimester: Secondary | ICD-10-CM | POA: Insufficient documentation

## 2023-06-11 MED ORDER — FLUOXETINE HCL 40 MG PO CAPS
40.0000 mg | ORAL_CAPSULE | Freq: Every day | ORAL | 3 refills | Status: DC
Start: 2023-06-11 — End: 2024-01-19

## 2023-06-11 NOTE — Progress Notes (Signed)
New OB Intake  I explained I am completing New OB Intake today. We discussed EDD of 01/24/2024, by Ultrasound. Pt is [id number]. I reviewed her allergies, medications and Medical/Surgical/OB history.    Patient Active Problem List   Diagnosis Date Noted   Supervision of other normal pregnancy, antepartum 06/11/2023   Generalized anxiety disorder 10/31/2021    Concerns addressed today  Patient informed that the ultrasound is considered a limited obstetric ultrasound and is not intended to be a complete ultrasound exam.  Patient also informed that the ultrasound is not being completed with the intent of assessing for fetal or placental anomalies or any pelvic abnormalities. Explained that the purpose of today's ultrasound is to assess for viability.  Patient acknowledges the purpose of the exam and the limitations of the study.     Delivery Plans Plans to deliver at [facility]. Discussed the nature of our practice with multiple providers including residents and students. Due to the size of the practice, the delivering provider may not be the same as those providing prenatal care.   MyChart/Babyscripts MyChart access verified. I explained pt will have some visits in office and some virtually. Babyscripts app discussed and ordered.   Blood Pressure Cuff Blood pressure cuff  has one from previous pregnancy. Discussed to be used for virtual visits and or if needed BP checks weekly.  Anatomy US Explained first scheduled [place] will be around 19 weeks.   Last Pap Diagnosis  Date Value Ref Range Status  01/23/2023   Final   - Negative for intraepithelial lesion or malignancy (NILM)    First visit review I reviewed new OB appt with patient. Explained pt will be seen by Dr Shawnie Pons at first visit. Discussed [name] genetic screening with patient , pt desires panorama, had horizon with previous pregnancy.. Routine prenatal labs to be collected at [facility] OB visit.    Scheryl Marten, RN 06/11/2023  1:50 PM

## 2023-06-11 NOTE — Addendum Note (Signed)
Addended by: Scheryl Marten on: 06/11/2023 01:59 PM   Modules accepted: Orders

## 2023-07-10 ENCOUNTER — Other Ambulatory Visit (HOSPITAL_COMMUNITY)
Admission: RE | Admit: 2023-07-10 | Discharge: 2023-07-10 | Disposition: A | Discharge status: Home / Self Care | Payer: BC Managed Care – PPO | Source: Ambulatory Visit | Attending: Family Medicine | Admitting: Family Medicine

## 2023-07-10 ENCOUNTER — Ambulatory Visit (INDEPENDENT_AMBULATORY_CARE_PROVIDER_SITE_OTHER): Payer: BC Managed Care – PPO | Admitting: Family Medicine

## 2023-07-10 ENCOUNTER — Encounter: Payer: Self-pay | Admitting: Family Medicine

## 2023-07-10 VITALS — BP 131/81 | HR 80 | Wt 213.0 lb

## 2023-07-10 DIAGNOSIS — D696 Thrombocytopenia, unspecified: Secondary | ICD-10-CM

## 2023-07-10 DIAGNOSIS — Z3480 Encounter for supervision of other normal pregnancy, unspecified trimester: Secondary | ICD-10-CM | POA: Insufficient documentation

## 2023-07-10 DIAGNOSIS — Z8632 Personal history of gestational diabetes: Secondary | ICD-10-CM | POA: Diagnosis not present

## 2023-07-10 DIAGNOSIS — O09891 Supervision of other high risk pregnancies, first trimester: Secondary | ICD-10-CM

## 2023-07-10 DIAGNOSIS — Z3A11 11 weeks gestation of pregnancy: Secondary | ICD-10-CM

## 2023-07-10 DIAGNOSIS — Z2839 Other underimmunization status: Secondary | ICD-10-CM

## 2023-07-10 NOTE — Progress Notes (Signed)
New OB  Last Pap: 01/23/23 WNL   Will need assistance getting FHT's.  CC: None

## 2023-07-10 NOTE — Progress Notes (Signed)
Subjective:   Pamela Doyle is a 27 y.o. [id number] at [redacted]w[redacted]d by early ultrasound being seen today for her first obstetrical visit.  Her obstetrical history is not significant. Patient does intend to breast feed. Pregnancy history fully reviewed.  Patient reports no complaints.  HISTORY: OB History  Gravida Para Term Preterm AB Living  4 2 2  0 1 2  SAB IAB Ectopic Multiple Live Births  1 0 0 0 2    # Outcome Date GA Lbr Len/2nd Weight Sex Type Anes PTL Lv  4 Current           3 SAB 11/2022          2 Term 06/19/21 [redacted]w[redacted]d 16:45 / 00:16 7 lb 8.6 oz (3.419 kg) M Vag-Spont EPI  LIV     Birth Comments: wnl     Name: Andria Frames     Apgar1: 9  Apgar5: 9  1 Term 07/14/20 [redacted]w[redacted]d 02:10 / 02:11 8 lb 5.5 oz (3.785 kg) M Vag-Spont EPI  LIV     Birth Comments: wnl     Name: Andria Frames     Apgar1: 8  Apgar5: 9   Last pap smear was  01/23/2023 and was normal Past Medical History:  Diagnosis Date   Anemia    Biological false positive RPR test 12/23/2019   Negative T. 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   GDM (gestational diabetes mellitus), class A1 04/13/2021   Normal postpartum 2 hr GTT   Past Surgical History:  Procedure Laterality Date   NO PAST SURGERIES     Family History  Adopted: Yes  Problem Relation Age of Onset   Healthy Mother    Drug abuse Father    Breast cancer Maternal Grandmother    Breast cancer Maternal Great-grandmother    Social History   Tobacco Use   Smoking status: Never   Smokeless tobacco: Never  Vaping Use   Vaping status: Never Used  Substance Use Topics   Alcohol use: Not Currently    Comment: Socially   Drug use: Not Currently   No Known Allergies Current Outpatient Medications on File Prior to Visit  Medication Sig Dispense Refill   ascorbic acid (VITAMIN C) 500 MG tablet Take 500 mg by mouth daily.     cyanocobalamin (VITAMIN B12) 100 MCG tablet Take 100 mcg by  mouth daily.     docusate sodium (COLACE) 50 MG capsule Take 50 mg by mouth 2 (two) times daily.     ferrous sulfate 325 (65 FE) MG EC tablet Take 325 mg by mouth 3 (three) times daily with meals.     FLUoxetine (PROZAC) 40 MG capsule Take 1 capsule (40 mg total) by mouth daily. 90 capsule 3   No current facility-administered medications on file prior to visit.     Exam   Vitals:   07/10/23 1412  BP: 131/81  Pulse: 80  Weight: 213 lb (96.6 kg)   Fetal Heart Rate (bpm): 155  System: General: well-developed, well-nourished female in no acute distress   Skin: normal coloration and turgor, no rashes   Neurologic: oriented, normal, negative, normal mood   Extremities: normal strength, tone, and muscle mass, ROM of all joints is normal   HEENT PERRLA, extraocular movement intact and sclera clear, anicteric   Mouth/Teeth mucous membranes moist, pharynx normal without lesions and dental hygiene good   Neck supple and  no masses   Cardiovascular: regular rate and rhythm   Respiratory:  no respiratory distress, normal breath sounds   Abdomen: soft, non-tender; bowel sounds normal; no masses,  no organomegaly     Assessment:   Pregnancy: [id number] Patient Active Problem List   Diagnosis Date Noted   History of gestational diabetes 07/10/2023   Supervision of other normal pregnancy, antepartum 06/11/2023   Generalized anxiety disorder 10/31/2021     Plan:  1. Supervision of other normal pregnancy, antepartum New OB labs today with genetics - CBC/D/Plt+RPR+Rh+ABO+RubIgG... - Culture, OB Urine - Cervicovaginal ancillary only - PANORAMA PRENATAL TEST - [place] MFM OB DETAIL +14 WK; Future  2. History of gestational diabetes A1C today - Hemoglobin A1c  3. [redacted] weeks gestation of pregnancy    Initial labs drawn. Continue prenatal vitamins. Genetic Screening discussed, NIPS: ordered. Ultrasound discussed; fetal anatomic survey: ordered. Problem list reviewed and updated.  Routine  obstetric precautions reviewed. Return in 4 weeks (on 08/07/2023).

## 2023-07-11 DIAGNOSIS — Z2839 Other underimmunization status: Secondary | ICD-10-CM | POA: Insufficient documentation

## 2023-07-11 DIAGNOSIS — O99119 Other diseases of the blood and blood-forming organs and certain disorders involving the immune mechanism complicating pregnancy, unspecified trimester: Secondary | ICD-10-CM

## 2023-07-11 DIAGNOSIS — D696 Thrombocytopenia, unspecified: Secondary | ICD-10-CM | POA: Insufficient documentation

## 2023-07-11 DIAGNOSIS — O09899 Supervision of other high risk pregnancies, unspecified trimester: Secondary | ICD-10-CM

## 2023-07-11 HISTORY — DX: Supervision of other high risk pregnancies, unspecified trimester: O09.899

## 2023-07-11 HISTORY — DX: Thrombocytopenia, unspecified: O99.119

## 2023-07-11 HISTORY — DX: Thrombocytopenia, unspecified: D69.6

## 2023-07-11 LAB — CBC/D/PLT+RPR+RH+ABO+RUBIGG...
Antibody Screen: NEGATIVE
Basophils Absolute: 0 10*3/uL (ref 0.0–0.2)
Basos: 0 %
EOS (ABSOLUTE): 0.1 10*3/uL (ref 0.0–0.4)
Eos: 1 %
HCV Ab: NONREACTIVE
HIV Screen 4th Generation wRfx: NONREACTIVE
Hematocrit: 40 % (ref 34.0–46.6)
Hemoglobin: 13.7 g/dL (ref 11.1–15.9)
Hepatitis B Surface Ag: NEGATIVE
Immature Grans (Abs): 0 10*3/uL (ref 0.0–0.1)
Immature Granulocytes: 0 %
Lymphocytes Absolute: 1.7 10*3/uL (ref 0.7–3.1)
Lymphs: 20 %
MCH: 31.3 pg (ref 26.6–33.0)
MCHC: 34.3 g/dL (ref 31.5–35.7)
MCV: 91 fL (ref 79–97)
Monocytes Absolute: 0.5 10*3/uL (ref 0.1–0.9)
Monocytes: 6 %
Neutrophils Absolute: 6.1 10*3/uL (ref 1.4–7.0)
Neutrophils: 73 %
Platelets: 149 10*3/uL — ABNORMAL LOW (ref 150–450)
RBC: 4.38 x10E6/uL (ref 3.77–5.28)
RDW: 12.2 % (ref 11.7–15.4)
RPR Ser Ql: NONREACTIVE
Rh Factor: POSITIVE
Rubella Antibodies, IGG: <0.9 {index} — ABNORMAL LOW (ref >0.99)
WBC: 8.4 10*3/uL (ref 3.4–10.8)

## 2023-07-11 LAB — HCV INTERPRETATION

## 2023-07-12 LAB — URINE CULTURE, OB REFLEX

## 2023-07-12 LAB — CULTURE, OB URINE

## 2023-07-14 LAB — CERVICOVAGINAL ANCILLARY ONLY
Chlamydia: NEGATIVE
Comment: NEGATIVE
Comment: NORMAL
Neisseria Gonorrhea: NEGATIVE

## 2023-07-15 LAB — SPECIMEN STATUS REPORT

## 2023-07-15 LAB — HEMOGLOBIN A1C
Est. average glucose Bld gHb Est-mCnc: 103 mg/dL
Hgb A1c MFr Bld: 5.2 % (ref 4.8–5.6)

## 2023-07-20 LAB — PANORAMA PRENATAL TEST FULL PANEL:PANORAMA TEST PLUS 5 ADDITIONAL MICRODELETIONS: FETAL FRACTION: 6.2

## 2023-07-24 ENCOUNTER — Encounter: Payer: Self-pay | Admitting: Family Medicine

## 2023-07-25 ENCOUNTER — Other Ambulatory Visit: Payer: Self-pay | Admitting: *Deleted

## 2023-07-25 MED ORDER — TERCONAZOLE 0.8 % VA CREA
1.0000 | TOPICAL_CREAM | Freq: Every day | VAGINAL | 2 refills | Status: DC
Start: 2023-07-25 — End: 2023-10-14

## 2023-07-28 ENCOUNTER — Telehealth: Payer: Self-pay

## 2023-07-28 NOTE — Telephone Encounter (Signed)
Attempted to reach patient regarding message left with answering service Left voicemail for patient to call office or send mychart message with details regarding appointment needs.   

## 2023-08-07 ENCOUNTER — Encounter: Payer: BC Managed Care – PPO | Admitting: Obstetrics and Gynecology

## 2023-08-11 ENCOUNTER — Encounter: Payer: Self-pay | Admitting: Obstetrics and Gynecology

## 2023-08-11 ENCOUNTER — Ambulatory Visit (INDEPENDENT_AMBULATORY_CARE_PROVIDER_SITE_OTHER): Payer: BC Managed Care – PPO | Admitting: Obstetrics and Gynecology

## 2023-08-11 VITALS — BP 123/80 | HR 78 | Wt 213.0 lb

## 2023-08-11 DIAGNOSIS — Z3A16 16 weeks gestation of pregnancy: Secondary | ICD-10-CM

## 2023-08-11 DIAGNOSIS — Z8632 Personal history of gestational diabetes: Secondary | ICD-10-CM

## 2023-08-11 DIAGNOSIS — D696 Thrombocytopenia, unspecified: Secondary | ICD-10-CM

## 2023-08-11 NOTE — Progress Notes (Signed)
ROB   AFP: Declines today.   VH:QION

## 2023-08-11 NOTE — Progress Notes (Signed)
   PRENATAL VISIT NOTE  Subjective:  Pamela Doyle is a 27 y.o. [id number] at [redacted]w[redacted]d being seen today for ongoing prenatal care.  She is currently monitored for the following issues for this low-risk pregnancy and has Generalized anxiety disorder; Supervision of other normal pregnancy, antepartum; History of gestational diabetes; Rubella non-immune status, antepartum; and Thrombocytopenia (HCC) on their problem list.  Patient reports no complaints.  Contractions: Not present. Vag. Bleeding: None.  Movement: Absent. Denies leaking of fluid.   The following portions of the patient's history were reviewed and updated as appropriate: allergies, current medications, past family history, past medical history, past social history, past surgical history and problem list.   Objective:   Vitals:   08/11/23 1419  BP: 123/80  Pulse: 78  Weight: 213 lb (96.6 kg)    Fetal Status: Fetal Heart Rate (bpm): 135   Movement: Absent     General:  Alert, oriented and cooperative. Patient is in no acute distress.  Skin: Skin is warm and dry. No rash noted.   Cardiovascular: Normal heart rate noted  Respiratory: Normal respiratory effort, no problems with respiration noted  Abdomen: Soft, gravid, appropriate for gestational age.  Pain/Pressure: Absent     Pelvic: Cervical exam deferred        Extremities: Normal range of motion.  Edema: None  Mental Status: Normal mood and affect. Normal behavior. Normal judgment and thought content.   Assessment and Plan:  Pregnancy: [id number] at [redacted]w[redacted]d 1. Thrombocytopenia (HCC) H/o gestational thrombocytopenia. Repeat sometime in October  2. [redacted] weeks gestation of pregnancy F/u mfm anatomy u/s. Weight stable  Preterm labor symptoms and general obstetric precautions including but not limited to vaginal bleeding, contractions, leaking of fluid and fetal movement were reviewed in detail with the patient. Please refer to After Visit Summary for other counseling recommendations.    Return in about 6 weeks (around 09/22/2023).  Future Appointments  Date Time Provider Department Center  09/04/2023 12:15 PM [facility] NURSE [facility]  09/04/2023 12:30 PM [facility] US2 [facility]  09/11/2023 10:55 AM Laird Bing, MD [facility]  10/09/2023 10:55 AM Rogers Bing, MD [facility]    [place] Bing, MD

## 2023-08-29 ENCOUNTER — Encounter: Payer: Self-pay | Admitting: Family Medicine

## 2023-08-29 ENCOUNTER — Inpatient Hospital Stay (HOSPITAL_COMMUNITY)
Admission: AD | Admit: 2023-08-29 | Discharge: 2023-08-29 | Disposition: A | Discharge status: Home / Self Care | Payer: BC Managed Care – PPO | Attending: Obstetrics & Gynecology | Admitting: Obstetrics & Gynecology

## 2023-08-29 ENCOUNTER — Other Ambulatory Visit: Payer: Self-pay

## 2023-08-29 DIAGNOSIS — Z3480 Encounter for supervision of other normal pregnancy, unspecified trimester: Secondary | ICD-10-CM

## 2023-08-29 DIAGNOSIS — N390 Urinary tract infection, site not specified: Secondary | ICD-10-CM | POA: Insufficient documentation

## 2023-08-29 DIAGNOSIS — R300 Dysuria: Secondary | ICD-10-CM

## 2023-08-29 DIAGNOSIS — O26892 Other specified pregnancy related conditions, second trimester: Secondary | ICD-10-CM | POA: Diagnosis not present

## 2023-08-29 DIAGNOSIS — O26832 Pregnancy related renal disease, second trimester: Secondary | ICD-10-CM | POA: Diagnosis not present

## 2023-08-29 DIAGNOSIS — D696 Thrombocytopenia, unspecified: Secondary | ICD-10-CM

## 2023-08-29 DIAGNOSIS — Z3A18 18 weeks gestation of pregnancy: Secondary | ICD-10-CM | POA: Diagnosis not present

## 2023-08-29 DIAGNOSIS — O2342 Unspecified infection of urinary tract in pregnancy, second trimester: Secondary | ICD-10-CM

## 2023-08-29 DIAGNOSIS — Z8632 Personal history of gestational diabetes: Secondary | ICD-10-CM

## 2023-08-29 DIAGNOSIS — Z2839 Encounter for supervision of normal pregnancy, unspecified, unspecified trimester: Secondary | ICD-10-CM

## 2023-08-29 LAB — URINALYSIS, ROUTINE W REFLEX MICROSCOPIC
Bilirubin Urine: NEGATIVE
Glucose, UA: NEGATIVE mg/dL
Ketones, ur: 20 mg/dL — AB
Nitrite: NEGATIVE
Protein, ur: 100 mg/dL — AB
RBC / HPF: >50 RBC/hpf (ref 0–5)
Specific Gravity, Urine: 1.017 (ref 1.005–1.030)
WBC, UA: >50 WBC/hpf (ref 0–5)
pH: 5 (ref 5.0–8.0)

## 2023-08-29 MED ORDER — NITROFURANTOIN MONOHYD MACRO 100 MG PO CAPS
100.0000 mg | ORAL_CAPSULE | Freq: Two times a day (BID) | ORAL | 0 refills | Status: DC
Start: 2023-08-29 — End: 2023-11-14

## 2023-08-29 NOTE — MAU Provider Note (Signed)
Chief Complaint: UTI Symptoms   None     SUBJECTIVE HPI: Pamela Doyle is a 27 y.o. [id number] at [redacted]w[redacted]d who presents to maternity admissions reporting dysuria, abdominal cramping and blood when urinating.  She has hx of UTIs in pregnancy, in prior pregnancies and in this pregnancy.  HPI  Past Medical History:  Diagnosis Date   Anemia    Biological false positive RPR test 12/23/2019   Negative T. 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   GDM (gestational diabetes mellitus), class A1 04/13/2021   Normal postpartum 2 hr GTT   Past Surgical History:  Procedure Laterality Date   NO PAST SURGERIES     Social History   Socioeconomic History   Marital status: Married    Spouse name: Not on file   Number of children: Not on file   Years of education: Not on file   Highest education level: Not on file  Occupational History   Not on file  Tobacco Use   Smoking status: Never   Smokeless tobacco: Never  Vaping Use   Vaping status: Never Used  Substance and Sexual Activity   Alcohol use: Not Currently    Comment: Socially   Drug use: Not Currently   Sexual activity: Yes    Birth control/protection: Rhythm  Other Topics Concern   Not on file  Social History Narrative   Not on file   Social Determinants of Health   Financial Resource Strain: Not on file  Food Insecurity: Not on file  Transportation Needs: Not on file  Physical Activity: Not on file  Stress: Not on file  Social Connections: Unknown (04/02/2022)   Received from [facility]   Social Network    Social Network: Not on file  Intimate Partner Violence: Unknown (02/22/2022)   Received from [facility]   HITS    Physically Hurt: Not on file    Insult or Talk Down To: Not on file    Threaten Physical Harm: Not on file    Scream or Curse: Not on file   No current facility-administered medications on file prior to  encounter.   Current Outpatient Medications on File Prior to Encounter  Medication Sig Dispense Refill   ascorbic acid (VITAMIN C) 500 MG tablet Take 500 mg by mouth daily.     cyanocobalamin (VITAMIN B12) 100 MCG tablet Take 100 mcg by mouth daily.     docusate sodium (COLACE) 50 MG capsule Take 50 mg by mouth 2 (two) times daily.     ferrous sulfate 325 (65 FE) MG EC tablet Take 325 mg by mouth 3 (three) times daily with meals.     FLUoxetine (PROZAC) 40 MG capsule Take 1 capsule (40 mg total) by mouth daily. 90 capsule 3   nitrofurantoin, macrocrystal-monohydrate, (MACROBID) 100 MG capsule Take 1 capsule (100 mg total) by mouth 2 (two) times daily. 14 capsule 0   terconazole (TERAZOL 3) 0.8 % vaginal cream Place 1 applicator vaginally at bedtime. Apply nightly for three nights. 20 g 2   No Known Allergies  ROS:  Review of Systems  Constitutional:  Negative for chills, fatigue and fever.  Respiratory:  Negative for shortness of breath.   Cardiovascular:  Negative for chest pain.  Gastrointestinal:  Positive for abdominal pain.  Genitourinary:  Positive for dysuria and hematuria. Negative for difficulty urinating, flank pain, pelvic pain, vaginal bleeding, vaginal discharge and  vaginal pain.  Neurological:  Negative for dizziness and headaches.  Psychiatric/Behavioral: Negative.       I have reviewed patient's Past Medical Hx, Surgical Hx, Family Hx, Social Hx, medications and allergies.   Physical Exam  Patient Vitals for the past 24 hrs:  BP Temp Temp src Pulse Resp SpO2 Height Weight  08/29/23 1310 119/68 98.1 F (36.7 C) Oral 83 18 99 % -- --  08/29/23 1304 -- -- -- -- -- -- 5\' 8"  (1.727 m) 95.8 kg   Constitutional: Well-developed, well-nourished female in no acute distress.  Cardiovascular: normal rate Respiratory: normal effort GI: Abd soft, non-tender. Pos BS x 4 MS: Extremities nontender, no edema, normal ROM Neurologic: Alert and oriented x 4.  GU: Neg  CVAT.  PELVIC EXAM: Cervix pink, visually closed, without lesion, scant white creamy discharge, vaginal walls and external genitalia normal Bimanual exam: Cervix 0/long/high, firm, anterior, neg CMT, uterus nontender, nonenlarged, adnexa without tenderness, enlargement, or mass  FHT 142 by doppler  LAB RESULTS Results for orders placed or performed during the hospital encounter of 08/29/23 (from the past 24 hour(s))  Urinalysis, Routine w reflex microscopic -Urine, Clean Catch     Status: Abnormal   Collection Time: 08/29/23  1:14 PM  Result Value Ref Range   Color, Urine AMBER (A) YELLOW   APPearance CLOUDY (A) CLEAR   Specific Gravity, Urine 1.017 1.005 - 1.030   pH 5.0 5.0 - 8.0   Glucose, UA NEGATIVE NEGATIVE mg/dL   Hgb urine dipstick LARGE (A) NEGATIVE   Bilirubin Urine NEGATIVE NEGATIVE   Ketones, ur 20 (A) NEGATIVE mg/dL   Protein, ur 161 (A) NEGATIVE mg/dL   Nitrite NEGATIVE NEGATIVE   Leukocytes,Ua LARGE (A) NEGATIVE   RBC / HPF >50 0 - 5 RBC/hpf   WBC, UA >50 0 - 5 WBC/hpf   Bacteria, UA RARE (A) NONE SEEN   Squamous Epithelial / HPF 6-10 0 - 5 /HPF   WBC Clumps PRESENT    Mucus PRESENT     B/Positive/-- (08/22 1441)  IMAGING No results found.  MAU Management/MDM: Orders Placed This Encounter  Procedures   Culture, OB Urine   Urinalysis, Routine w reflex microscopic -Urine, Clean Catch   Discharge patient    No orders of the defined types were placed in this encounter.   Large leukocytes and hematuria c/w UTI, nitrites negative.  Pt office called in Macrobid but pt was having spotting/bleeding so wanted to make sure everything was [facility] with pregnancy.  D/C home, take Macrobid as prescribed, f/u on Monday in office as scheduled.  Return precautions reviewed.    ASSESSMENT 1. Urinary tract infection in mother during second trimester of pregnancy   2. [redacted] weeks gestation of pregnancy     PLAN Discharge home Allergies as of 08/29/2023   No Known Allergies       Medication List     TAKE these medications    ascorbic acid 500 MG tablet Commonly known as: VITAMIN C Take 500 mg by mouth daily.   cyanocobalamin 100 MCG tablet Commonly known as: VITAMIN B12 Take 100 mcg by mouth daily.   docusate sodium 50 MG capsule Commonly known as: COLACE Take 50 mg by mouth 2 (two) times daily.   ferrous sulfate 325 (65 FE) MG EC tablet Take 325 mg by mouth 3 (three) times daily with meals.   FLUoxetine 40 MG capsule Commonly known as: PROzac Take 1 capsule (40 mg total) by mouth daily.   nitrofurantoin (  macrocrystal-monohydrate) 100 MG capsule Commonly known as: MACROBID Take 1 capsule (100 mg total) by mouth 2 (two) times daily.   terconazole 0.8 % vaginal cream Commonly known as: TERAZOL 3 Place 1 applicator vaginally at bedtime. Apply nightly for three nights.        Follow-up Information     [facility] Follow up.   Specialty: Obstetrics and Gynecology Why: As scheduled Contact information: [address] [place] [address] [phone]        [facility] Follow up.   Specialty: Obstetrics and Gynecology Why: As needed for emergencies Contact information: [address] [place] [address] [phone]                Sharen Counter Certified Nurse-Midwife 08/29/2023  4:14 PM

## 2023-08-29 NOTE — MAU Note (Signed)
Pamela Doyle is a 27 y.o. at [redacted]w[redacted]d here in MAU reporting: she's having urinary symptoms that began today.  Reports she has pain, burning, urgency and blood tinged urine.   Reports constant vaginal burning sensation in vaginal even when not voiding. Denies VB or LOF. LMP: NA Onset of complaint: today Pain score: 8 Vitals:   08/29/23 1310  BP: 119/68  Pulse: 83  Resp: 18  Temp: 98.1 F (36.7 C)  SpO2: 99%     FHT:142 bpm Lab orders placed from triage:   UA

## 2023-08-29 NOTE — Progress Notes (Signed)
Pt sent message regarding having a UTI and states they are frequent in pregnancy for her . notes burning, nothing coming out when attempting to urinate yet still having the urge.  I consulted with provider and ok to send Rx on protocol and pt come in for sample on Monday. Pt messaged back now having bleeding and going to MAU for an evaluation.  I advised pt to await instructions from MAU as far as appt we were about to schedule for Monday.  Pt agreeable and voiced understanding.

## 2023-08-31 LAB — CULTURE, OB URINE: Culture: >=100000 — AB

## 2023-09-04 ENCOUNTER — Ambulatory Visit: Payer: BC Managed Care – PPO | Attending: Family Medicine

## 2023-09-04 ENCOUNTER — Encounter: Payer: Self-pay | Admitting: *Deleted

## 2023-09-04 ENCOUNTER — Other Ambulatory Visit: Payer: Self-pay

## 2023-09-04 ENCOUNTER — Ambulatory Visit: Payer: BC Managed Care – PPO | Admitting: *Deleted

## 2023-09-04 ENCOUNTER — Encounter: Payer: BC Managed Care – PPO | Admitting: Family Medicine

## 2023-09-04 VITALS — BP 112/66 | HR 85

## 2023-09-04 DIAGNOSIS — O99212 Obesity complicating pregnancy, second trimester: Secondary | ICD-10-CM | POA: Insufficient documentation

## 2023-09-04 DIAGNOSIS — D696 Thrombocytopenia, unspecified: Secondary | ICD-10-CM | POA: Insufficient documentation

## 2023-09-04 DIAGNOSIS — O0992 Supervision of high risk pregnancy, unspecified, second trimester: Secondary | ICD-10-CM | POA: Insufficient documentation

## 2023-09-04 DIAGNOSIS — O09299 Supervision of pregnancy with other poor reproductive or obstetric history, unspecified trimester: Secondary | ICD-10-CM | POA: Diagnosis not present

## 2023-09-04 DIAGNOSIS — O24112 Pre-existing diabetes mellitus, type 2, in pregnancy, second trimester: Secondary | ICD-10-CM | POA: Insufficient documentation

## 2023-09-04 DIAGNOSIS — O99112 Other diseases of the blood and blood-forming organs and certain disorders involving the immune mechanism complicating pregnancy, second trimester: Secondary | ICD-10-CM | POA: Insufficient documentation

## 2023-09-04 DIAGNOSIS — O09292 Supervision of pregnancy with other poor reproductive or obstetric history, second trimester: Secondary | ICD-10-CM | POA: Insufficient documentation

## 2023-09-04 DIAGNOSIS — Z3480 Encounter for supervision of other normal pregnancy, unspecified trimester: Secondary | ICD-10-CM | POA: Insufficient documentation

## 2023-09-04 DIAGNOSIS — Z3A19 19 weeks gestation of pregnancy: Secondary | ICD-10-CM | POA: Insufficient documentation

## 2023-09-04 DIAGNOSIS — Z8632 Personal history of gestational diabetes: Secondary | ICD-10-CM | POA: Diagnosis not present

## 2023-09-10 ENCOUNTER — Encounter: Payer: Self-pay | Admitting: Family Medicine

## 2023-09-11 ENCOUNTER — Telehealth (INDEPENDENT_AMBULATORY_CARE_PROVIDER_SITE_OTHER): Payer: BC Managed Care – PPO | Admitting: Obstetrics and Gynecology

## 2023-09-11 ENCOUNTER — Telehealth: Payer: Self-pay

## 2023-09-11 ENCOUNTER — Encounter: Payer: Self-pay | Admitting: Obstetrics and Gynecology

## 2023-09-11 DIAGNOSIS — O2342 Unspecified infection of urinary tract in pregnancy, second trimester: Secondary | ICD-10-CM | POA: Insufficient documentation

## 2023-09-11 DIAGNOSIS — Z3A20 20 weeks gestation of pregnancy: Secondary | ICD-10-CM

## 2023-09-11 DIAGNOSIS — Z3480 Encounter for supervision of other normal pregnancy, unspecified trimester: Secondary | ICD-10-CM

## 2023-09-11 DIAGNOSIS — Z8632 Personal history of gestational diabetes: Secondary | ICD-10-CM

## 2023-09-11 NOTE — Progress Notes (Signed)
OBSTETRICS PRENATAL VIRTUAL VISIT ENCOUNTER NOTE  Provider location: [facility]   Patient location: Home  I connected with Pamela Doyle on 09/11/23 at 10:55 AM EDT by MyChart Video Encounter and verified that I am speaking with the correct person using two identifiers. I discussed the limitations, risks, security and privacy concerns of performing an evaluation and management service virtually and the availability of in person appointments. I also discussed with the patient that there may be a patient responsible charge related to this service. The patient expressed understanding and agreed to proceed. Subjective:  Pamela Doyle is a 27 y.o. [id number] at [redacted]w[redacted]d being seen today for ongoing prenatal care.  She is currently monitored for the following issues for this low-risk pregnancy and has Generalized anxiety disorder; Supervision of other normal pregnancy, antepartum; History of gestational diabetes; Rubella non-immune status, antepartum; and Thrombocytopenia (HCC) on their problem list.  Patient reports no complaints.  Contractions: Not present. Vag. Bleeding: None.  Movement: Present. Denies any leaking of fluid.   The following portions of the patient's history were reviewed and updated as appropriate: allergies, current medications, past family history, past medical history, past social history, past surgical history and problem list.   Objective:  There were no vitals filed for this visit.  Fetal Status:     Movement: Present     General:  Alert, oriented and cooperative. Patient is in no acute distress.  Respiratory: Normal respiratory effort, no problems with respiration noted  Mental Status: Normal mood and affect. Normal behavior. Normal judgment and thought content.  Rest of physical exam deferred due to type of encounter  Imaging: [place] MFM OB DETAIL +14 WK  Result Date:  09/04/2023 ----------------------------------------------------------------------  OBSTETRICS REPORT                    (Corrected Final 09/04/2023 01:40 pm) ---------------------------------------------------------------------- Patient Info  ID #:       [id number]                          D.O.B.:  1996/08/19 (27 yrs)  Name:       Pamela Doyle                     Visit Date: 09/04/2023 12:18 pm ---------------------------------------------------------------------- Performed By  Attending:        Braxton Feathers DO       Ref. Address:     [address]                                                             [address]  Performed By:     Dennis Bast RDMS      [facility]  Referred By:      [facility] ---------------------------------------------------------------------- Orders  #  Description                           Code        Ordered By  1  [place] MFM OB DETAIL +14 WK               76811.01    Tinnie Gens ----------------------------------------------------------------------  #  Order #                     Accession #                Episode #  1  [id number]                   [id number]                 [id number] ---------------------------------------------------------------------- Indications  Thrombocytopenia affecting pregnancy,          O99.119, D69.6  antepartum  Poor obstetric history: Previous gestational   O09.299  diabetes  Obesity complicating pregnancy, second         O99.212  trimester (BMI 33)  Malformation of placenta, unspecified,         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 Cord Insertion:      Visualized  Amniotic Fluid  AFI FV:      Within normal limits                              Largest Pocket(cm)                              5.44 ---------------------------------------------------------------------- Biometry  BPD:        45  mm     G. Age:  19w 4d         45  %    CI:        71.62   %    70 - 86                                                          FL/HC:  19.8   %    16.8 - 19.8  HC:      169.3  mm     G. Age:  19w 4d         35  %    HC/AC:      1.14        1.09 - 1.39  AC:      148.6  mm     G. Age:  20w 1d         59  %    FL/BPD:     74.7   %  FL:       33.6  mm     G. Age:  20w 4d         72  %    FL/AC:      22.6   %    20 - 24  HUM:      31.4  mm     G. Age:  20w 3d         72  %  CER:      19.9  mm     G. Age:  19w 2d         47  %  LV:        6.9  mm  CM:        4.5  mm  Est. FW:     339  gm    0 lb 12 oz      74  % ---------------------------------------------------------------------- OB History  Blood Type:   B+  Gravidity:    4         Term:   2        Prem:   0        SAB:   1  TOP:          0       Ectopic:  0        Living: 2 ---------------------------------------------------------------------- Gestational Age  U/S Today:     20w 0d                                        EDD:   01/22/24  Best:          19w 5d     Det. By:  U/S C R L  (06/11/23)    EDD:   01/24/24 ---------------------------------------------------------------------- Targeted Anatomy  Central Nervous System  Calvarium/Cranial V.:  Appears normal         Cereb./Vermis:          Appears normal  Cavum:                 Appears normal         Cisterna Magna:         Appears normal  Lateral Ventricles:    Appears normal         Midline  Falx:           Appears normal  Choroid Plexus:        Appears normal  Spine  Cervical:              Appears normal         Sacral:                 Appears normal  Thoracic:  Appears normal         Shape/Curvature:        Appears normal  Lumbar:                Appears normal  Head/Neck  Lips:                  Appears normal         Profile:                Appears normal  Neck:                  Appears normal         Orbits/Eyes:            Appears normal  Nuchal Fold:           Appears normal         Mandible:               Appears normal  Nasal Bone:            Present                Maxilla:                Appears normal  Thorax  4 Chamber View:        Appears normal         Interventr. Septum:     Appears normal  Cardiac Rhythm:        Normal                 Cardiac Axis:           Normal  Cardiac Situs:         Appears normal         Diaphragm:              Appears normal  Rt Outflow Tract:      Appears normal         3 Vessel View:          Appears normal  Lt Outflow Tract:      Appears normal         3 V Trachea View:       Appears normal  Aortic Arch:           Not well visualized    IVC:                    Appears normal  Ductal Arch:           Appears normal         Crossing:               Appears normal  SVC:                   Appears normal  Abdomen  Ventral Wall:          Appears normal         Lt Kidney:              Appears normal  Cord Insertion:        Appears normal         Rt Kidney:              Appears normal  Situs:                 Appears normal  Bladder:                Appears normal  Stomach:               Appears normal  Extremities  Lt Humerus:            Appears normal         Lt Femur:               Appears normal  Rt Humerus:            Appears normal         Rt Femur:               Appears normal  Lt Forearm:            Appears normal         Lt Lower Leg:           Appears normal  Rt Forearm:            Appears normal         Rt Lower Leg:           Appears normal   Lt Hand:               Open hand nml          Lt Foot:                Nml heel/foot  Rt Hand:               Open hand nml          Rt Foot:                Nml heel/foot  Other  Umbilical Cord:        Normal 3-vessel        Genitalia:              Female-nml ---------------------------------------------------------------------- Cervix Uterus Adnexa  Cervix  Length:            3.7  cm.  Normal appearance by transabdominal scan  Uterus  No abnormality visualized.  Right Ovary  Size(cm)     3.23   x   2.11   x  1.6       Vol(ml): 5.71  Within normal limits.  Left Ovary  Not visualized.  Cul De Sac  No free fluid seen.  Adnexa  No abnormality visualized ---------------------------------------------------------------------- Comments  The patient is here for an anatomy ultrasound. Her pregnancy  is complicated by gestational thrombocytopenia in a previous  pregnancy.  Sonographic findings  Single intrauterine pregnancy at 19w 5d.  Fetal cardiac activity:  Observed and appears normal.  Presentation: Cephalic.  The anatomic structures that were well seen appear normal  without evidence of soft markers. The anatomic survey is  complete.  Fetal biometry shows the estimated fetal weight at the 74  percentile.  Amniotic fluid: Within normal limits.  MVP: 5.44 cm.  Placenta: Circumvallate placenta ant.  Adnexa: No abnormality visualized.  Cervical length: 3.7 cm.  Recommendations  -EDD should be 01/24/2024 based on  U/S C R L  (06/11/23).  - No further ultrasounds are recommended at this time based  on the current indications. If future indications arise (e.g.  size/date discrepancy on fundal height, gestational diabetes  or hypertension) and an ultrasound is to be desired at our  MFM office, please send a referral. ----------------------------------------------------------------------  Braxton Feathers, DO Electronically Signed Corrected Final Report  09/04/2023 01:40 pm  ----------------------------------------------------------------------    Assessment and Plan:  Pregnancy: [id number] at [redacted]w[redacted]d  Pregnancy: Routine care. GTT next visit  Hematology:  Will clarify with heme but should be okay to do her repeat labs when she does her GTT  UTI in pregnancy: TOC next visit  Preterm labor symptoms and general obstetric precautions including but not limited to vaginal bleeding, contractions, leaking of fluid and fetal movement were reviewed in detail with the patient. I discussed the assessment and treatment plan with the patient. The patient was provided an opportunity to ask questions and all were answered. The patient agreed with the plan and demonstrated an understanding of the instructions. The patient was advised to call back or seek an in-person office evaluation/go to MAU at [facility] for any urgent or concerning symptoms. Please refer to After Visit Summary for other counseling recommendations.   I provided 7 minutes of face-to-face time during this encounter.  No follow-ups on file.  Future Appointments  Date Time Provider Department Center  10/09/2023 10:55 AM Woodlawn Bing, MD [facility]    Woodbine Bing, MD [facility], [facility]

## 2023-09-11 NOTE — Telephone Encounter (Signed)
Left message for pt to call office back regarding upcoming prenatal visits per Dr Vergie Living

## 2023-10-09 ENCOUNTER — Encounter: Payer: BC Managed Care – PPO | Admitting: Obstetrics and Gynecology

## 2023-10-09 ENCOUNTER — Encounter: Payer: Self-pay | Admitting: Family Medicine

## 2023-10-14 ENCOUNTER — Ambulatory Visit (INDEPENDENT_AMBULATORY_CARE_PROVIDER_SITE_OTHER): Payer: BC Managed Care – PPO | Admitting: Obstetrics & Gynecology

## 2023-10-14 ENCOUNTER — Other Ambulatory Visit: Payer: BC Managed Care – PPO

## 2023-10-14 ENCOUNTER — Encounter: Payer: Self-pay | Admitting: Obstetrics & Gynecology

## 2023-10-14 VITALS — BP 117/75 | HR 74 | Wt 217.4 lb

## 2023-10-14 DIAGNOSIS — O2342 Unspecified infection of urinary tract in pregnancy, second trimester: Secondary | ICD-10-CM

## 2023-10-14 DIAGNOSIS — Z3A20 20 weeks gestation of pregnancy: Secondary | ICD-10-CM

## 2023-10-14 DIAGNOSIS — Z8632 Personal history of gestational diabetes: Secondary | ICD-10-CM

## 2023-10-14 DIAGNOSIS — D696 Thrombocytopenia, unspecified: Secondary | ICD-10-CM

## 2023-10-14 DIAGNOSIS — Z3A25 25 weeks gestation of pregnancy: Secondary | ICD-10-CM

## 2023-10-14 DIAGNOSIS — Z3480 Encounter for supervision of other normal pregnancy, unspecified trimester: Secondary | ICD-10-CM

## 2023-10-14 NOTE — Patient Instructions (Addendum)
Return to office for any scheduled appointments. Call the office or go to the MAU at [facility] at [facility] if: You begin to have strong, frequent contractions Your water breaks.  Sometimes it is a big gush of fluid, sometimes it is just a trickle that keeps getting your underwear wet or running down your legs You have vaginal bleeding.  It is normal to have a small amount of spotting if your cervix was checked.  You do not feel your baby moving like normal.  If you do not, get something to eat and drink and lay down and focus on feeling your baby move.   If your baby is still not moving like normal, you should call the office or go to MAU. Any other obstetric concerns.   TDaP Vaccine Pregnancy Get the Whooping Cough Vaccine While You Are Pregnant (CDC)  It is important for women to get the whooping cough vaccine in the third trimester of each pregnancy. Vaccines are the best way to prevent this disease. There are 2 different whooping cough vaccines. Both vaccines combine protection against whooping cough, tetanus and diphtheria, but they are for different age groups: Tdap: for everyone 11 years or older, including pregnant women  DTaP: for children 2 months through 79 years of age  You need the whooping cough vaccine during each of your pregnancies The recommended time to get the shot is during your 27th through 36th week of pregnancy, preferably during the earlier part of this time period. The Centers for Disease Control and Prevention (CDC) recommends that pregnant women receive the whooping cough vaccine for adolescents and adults (called Tdap vaccine) during the third trimester of each pregnancy. The recommended time to get the shot is during your 27th through 36th week of pregnancy, preferably during the earlier part of this time period. This replaces the original recommendation that pregnant women get the vaccine only if they had not previously received it. The [employer] and the [employer] support this recommendation.  You should get the whooping cough vaccine while pregnant to pass protection to your baby frame support disabled and/or not supported in this browser  Learn why Vernona Rieger decided to get the whooping cough vaccine in her 3rd trimester of pregnancy and how her baby girl was born with some protection against the disease. Also available on YouTube. After receiving the whooping cough vaccine, your body will create protective antibodies (proteins produced by the body to fight off diseases) and pass some of them to your baby before birth. These antibodies provide your baby some short-term protection against whooping cough in early life. These antibodies can also protect your baby from some of the more serious complications that come along with whooping cough. Your protective antibodies are at their highest about 2 weeks after getting the vaccine, but it takes time to pass them to your baby. So the preferred time to get the whooping cough vaccine is early in your third trimester. The amount of whooping cough antibodies in your body decreases over time. That is why CDC recommends you get a whooping cough vaccine during each pregnancy. Doing so allows each of your babies to get the greatest number of protective antibodies from you. This means each of your babies will get the best protection possible against this disease.  Getting the whooping cough vaccine while pregnant is better than getting the vaccine after you give birth Whooping cough vaccination during pregnancy is ideal so  your baby will have short-term protection as soon as he is born. This early protection is important because your baby will not start getting his whooping cough vaccines until he is 2 months old. These first few months of life are when your baby is at greatest risk for catching whooping cough. This is also when he's at greatest  risk for having severe, potentially life-threating complications from the infection. To avoid that gap in protection, it is best to get a whooping cough vaccine during pregnancy. You will then pass protection to your baby before he is born. To continue protecting your baby, he should get whooping cough vaccines starting at 2 months old. You may never have gotten the Tdap vaccine before and did not get it during this pregnancy. If so, you should make sure to get the vaccine immediately after you give birth, before leaving the hospital or birthing center. It will take about 2 weeks before your body develops protection (antibodies) in response to the vaccine. Once you have protection from the vaccine, you are less likely to give whooping cough to your newborn while caring for him. But remember, your baby will still be at risk for catching whooping cough from others. A recent study looked to see how effective Tdap was at preventing whooping cough in babies whose mothers got the vaccine while pregnant or in the hospital after giving birth. The study found that getting Tdap between 27 through 36 weeks of pregnancy is 85% more effective at preventing whooping cough in babies younger than 2 months old. Blood tests cannot tell if you need a whooping cough vaccine There are no blood tests that can tell you if you have enough antibodies in your body to protect yourself or your baby against whooping cough. Even if you have been sick with whooping cough in the past or previously received the vaccine, you still should get the vaccine during each pregnancy. Breastfeeding may pass some protective antibodies onto your baby By breastfeeding, you may pass some antibodies you have made in response to the vaccine to your baby. When you get a whooping cough vaccine during your pregnancy, you will have antibodies in your breast milk that you can share with your baby as soon as your milk comes in. However, your baby will not get  protective antibodies immediately if you wait to get the whooping cough vaccine until after delivering your baby. This is because it takes about 2 weeks for your body to create antibodies. Learn more about the health benefits of breastfeeding.    RSV Vaccination for Pregnant People  CDC recommends two ways to protect babies from getting very sick with Respiratory Syncytial Virus (RSV):  An RSV vaccination given during pregnancy  Pfizer's vaccine Verdis Frederickson) is recommended for use during pregnancy. It is given during RSV season to people who are 32 through [redacted] weeks pregnant.  Or, An RSV immunization given directly to infants and some older babies  Babies born to mothers who get RSV vaccine at least 2 weeks before delivery will have protection and, in most cases, should not need an RSV immunization later.    When is RSV season?  In most regions of the [place] RSV season starts in the fall and peaks in the winter, but the timing and severity of RSV season can vary from place to place and year to year.   The goal of maternal RSV vaccination is to protect babies from getting very sick with RSV during their first RSV season.  In most of the [place], this means maternal RSV vaccine will be given in September through January.  Who should get the maternal RSV vaccine?  People who are 34 through [redacted] weeks pregnant during September through January should get one dose of maternal RSV vaccine to protect their babies. RSV season can vary around the country.   How is the maternal RSV vaccine administered?  Maternal RSV vaccine is given as a shot into the mother's upper arm. Only a single dose (one shot) of maternal RSV vaccine is recommended.   It is not yet known whether another dose might be needed in later pregnancies.  How well does the maternal RSV vaccine work?  When someone gets RSV vaccine, their body responds by making a protein that protects against the virus that  causes RSV. The process takes about 2 weeks. When a pregnant person gets RSV vaccine, their protective proteins (called antibodies) also pass to their baby. So, babies who are born at least 2 weeks after their mother gets RSV vaccine are protected at birth, when infants are at the highest risk of severe RSV disease.   The vaccine can reduce a baby's risk of being hospitalized from RSV by 57% in the first six months after birth.  What are the possible side effects of the maternal RSV vaccine?  In the clinical trials, the side effects most often reported by pregnant people who received the maternal RSV vaccine were pain at the injection site, headache, muscle pain, and nausea.  Although not common, a dangerous high blood pressure condition called pre-eclampsia occurred in 1.8% of pregnant people who received the maternal RSV vaccine compared to 1.4% of pregnant people who received a placebo.  The clinical trials identified a small increase in the number of preterm births in vaccinated pregnant people. It is not clear if this is a true safety problem related to RSV vaccine or if this occurred for reasons unrelated to vaccination.  To reduce the potential risk of preterm birth and complications from RSV disease, FDA approved the maternal RSV vaccine for use during weeks 32 through 40 of pregnancy while additional studies are conducted.  FDA is requiring the manufacturer to do additional studies that will look more closely at the potential risk of preterm births and pregnancy-related high blood pressure issues in mothers, including pre-eclampsia.  Severe allergic reactions to vaccines are rare but can happen after any vaccine and can be life-threatening. If you see signs of a severe allergic reaction after vaccination (hives, swelling of the face and throat, difficulty breathing, a fast heartbeat, dizziness, or weakness), seek immediate medical care by calling 911.  As with any medicine or vaccine there  is a very remote chance of the vaccine causing other serious injury or death after vaccination.  Adverse events following vaccination should be reported to the Vaccine Adverse Event Reporting System (VAERS), even if it's not clear that the vaccine caused the adverse event. You or your doctor can report an adverse event to [facility] and FDA through VAERS. If you need further assistance reporting to VAERS, please email [email or web address] or call [phone].  If you have any questions about side effects from the maternal RSV vaccine, talk with your healthcare provider.  Do I need a prescription for a maternal RSV vaccine?  Until the vaccine available in the office, you will need a prescription to take to a local pharmacy that is providing the vaccine.   How do I pay for the maternal RSV vaccine?  Most private health insurance plans cover the maternal RSV vaccine, but there may be a cost to you depending on your plan.  Contact your insurer to find out.  Medicaid Beginning August 18, 2022, most people with coverage from [facility] and [employer] [facility]) will be guaranteed coverage of all vaccines recommended by the Advisory Committee on Immunization Practice at no cost to them.   Source: [facility] for Immunization and Respiratory Diseases

## 2023-10-14 NOTE — Progress Notes (Signed)
   PRENATAL VISIT NOTE  Subjective:  Pamela Doyle is a 27 y.o. W2N5621 at [redacted]w[redacted]d being seen today for ongoing prenatal care.  She is currently monitored for the following issues for this low-risk pregnancy and has Generalized anxiety disorder; Supervision of other normal pregnancy, antepartum; History of gestational diabetes; Rubella non-immune status, antepartum; Thrombocytopenia (HCC); and Urinary tract infection in mother during second trimester of pregnancy on their problem list.  Patient reports no complaints.  Contractions: Not present. Vag. Bleeding: None.  Movement: Present. Denies leaking of fluid.   The following portions of the patient's history were reviewed and updated as appropriate: allergies, current medications, past family history, past medical history, past social history, past surgical history and problem list.   Objective:   Vitals:   10/14/23 0827  BP: 117/75  Pulse: 74  Weight: 217 lb 6.4 oz (98.6 kg)    Fetal Status: Fetal Heart Rate (bpm): 136 Fundal Height: 26 cm Movement: Present     General:  Alert, oriented and cooperative. Patient is in no acute distress.  Skin: Skin is warm and dry. No rash noted.   Cardiovascular: Normal heart rate noted  Respiratory: Normal respiratory effort, no problems with respiration noted  Abdomen: Soft, gravid, appropriate for gestational age.  Pain/Pressure: Absent     Pelvic: Cervical exam deferred        Extremities: Normal range of motion.  Edema: None  Mental Status: Normal mood and affect. Normal behavior. Normal judgment and thought content.   Assessment and Plan:  Pregnancy: [id number] at [redacted]w[redacted]d 1. History of gestational diabetes GTT being done today, will follow up results and manage accordingly.  2. Thrombocytopenia (HCC) Was 149K on initial OB labs, CBC being checked today. Had history of gestational thrombocytopenia last pregnancy.   3. Urinary tract infection in mother during second trimester of pregnancy Test of  cure urine culture being done today.  4. [redacted] weeks gestation of pregnancy 5. Supervision of other normal pregnancy, antepartum Will get Tdap next visit. Preterm labor symptoms and general obstetric precautions including but not limited to vaginal bleeding, contractions, leaking of fluid and fetal movement were reviewed in detail with the patient. Please refer to After Visit Summary for other counseling recommendations.   Return in about 3 weeks (around 11/04/2023) for TDap, OFFICE OB VISIT (MD only).  Future Appointments  Date Time Provider Department Center  10/28/2023  9:55 AM Constant, Gigi Gin, MD [facility]  11/14/2023 11:15 AM Reva Bores, MD [facility]  11/25/2023 11:15 AM Macon Large, Jethro Bastos, MD [facility]  12/09/2023 10:35 AM Jeff Frieden, Jethro Bastos, MD [facility]    Jaynie Collins, MD

## 2023-10-15 ENCOUNTER — Encounter: Payer: Self-pay | Admitting: Obstetrics & Gynecology

## 2023-10-15 LAB — CBC
Hematocrit: 36.1 % (ref 34.0–46.6)
Hemoglobin: 11.8 g/dL (ref 11.1–15.9)
MCH: 30.1 pg (ref 26.6–33.0)
MCHC: 32.7 g/dL (ref 31.5–35.7)
MCV: 92 fL (ref 79–97)
Platelets: 119 10*3/uL — ABNORMAL LOW (ref 150–450)
RBC: 3.92 x10E6/uL (ref 3.77–5.28)
RDW: 13 % (ref 11.7–15.4)
WBC: 8.5 10*3/uL (ref 3.4–10.8)

## 2023-10-15 LAB — GLUCOSE TOLERANCE, 2 HOURS W/ 1HR
Glucose, 1 hour: 126 mg/dL (ref 70–179)
Glucose, 2 hour: 105 mg/dL (ref 70–152)
Glucose, Fasting: 76 mg/dL (ref 70–91)

## 2023-10-15 LAB — HEMOGLOBIN A1C
Est. average glucose Bld gHb Est-mCnc: 94 mg/dL
Hgb A1c MFr Bld: 4.9 % (ref 4.8–5.6)

## 2023-10-15 LAB — HIV ANTIBODY (ROUTINE TESTING W REFLEX): HIV Screen 4th Generation wRfx: NONREACTIVE

## 2023-10-15 LAB — RPR: RPR Ser Ql: NONREACTIVE

## 2023-10-17 LAB — CULTURE, OB URINE

## 2023-10-17 LAB — URINE CULTURE, OB REFLEX

## 2023-10-27 ENCOUNTER — Encounter: Payer: Self-pay | Admitting: Obstetrics and Gynecology

## 2023-10-28 ENCOUNTER — Encounter: Payer: Self-pay | Admitting: Obstetrics and Gynecology

## 2023-10-28 ENCOUNTER — Telehealth: Payer: BC Managed Care – PPO | Admitting: Obstetrics and Gynecology

## 2023-10-28 DIAGNOSIS — Z2839 Other underimmunization status: Secondary | ICD-10-CM

## 2023-10-28 DIAGNOSIS — D696 Thrombocytopenia, unspecified: Secondary | ICD-10-CM

## 2023-10-28 DIAGNOSIS — O99113 Other diseases of the blood and blood-forming organs and certain disorders involving the immune mechanism complicating pregnancy, third trimester: Secondary | ICD-10-CM

## 2023-10-28 DIAGNOSIS — O09899 Supervision of other high risk pregnancies, unspecified trimester: Secondary | ICD-10-CM

## 2023-10-28 DIAGNOSIS — Z3480 Encounter for supervision of other normal pregnancy, unspecified trimester: Secondary | ICD-10-CM

## 2023-10-28 DIAGNOSIS — Z3A27 27 weeks gestation of pregnancy: Secondary | ICD-10-CM

## 2023-10-28 NOTE — Progress Notes (Signed)
   OBSTETRICS PRENATAL VIRTUAL VISIT ENCOUNTER NOTE  Provider location: Center for [facility]   Patient location: Home  I connected with Pamela Doyle on 10/28/23 at  9:55 AM EST by MyChart Video Encounter and verified that I am speaking with the correct person using two identifiers. I discussed the limitations, risks, security and privacy concerns of performing an evaluation and management service virtually and the availability of in person appointments. I also discussed with the patient that there may be a patient responsible charge related to this service. The patient expressed understanding and agreed to proceed. Subjective:  Pamela Doyle is a 27 y.o. A2Z3086 at [redacted]w[redacted]d being seen today for ongoing prenatal care.  She is currently monitored for the following issues for this low-risk pregnancy and has Generalized anxiety disorder; Supervision of other normal pregnancy, antepartum; History of gestational diabetes; Rubella non-immune status, antepartum; Gestational thrombocytopenia (HCC); and Urinary tract infection in mother during second trimester of pregnancy on their problem list.  Patient reports no complaints.   . Vag. Bleeding: None.  Movement: Present. Denies any leaking of fluid.   The following portions of the patient's history were reviewed and updated as appropriate: allergies, current medications, past family history, past medical history, past social history, past surgical history and problem list.   Objective:  There were no vitals filed for this visit.  Fetal Status:     Movement: Present     General:  Alert, oriented and cooperative. Patient is in no acute distress.  Respiratory: Normal respiratory effort, no problems with respiration noted  Mental Status: Normal mood and affect. Normal behavior. Normal judgment and thought content.  Rest of physical exam deferred due to type of encounter  Imaging: No results found.  Assessment and Plan:  Pregnancy:  [id number] at [redacted]w[redacted]d 1. Supervision of other normal pregnancy, antepartum Patient is doing well without complaints Plans vasectomy for contraception Patient desires next appointment virtually  2. Benign gestational thrombocytopenia in third trimester [facility]) Reviewed last platelet count with patient of 119 and plan for repeat labs at 32 weeks Patient desires epidural at the time of delivery  3. Rubella non-immune status, antepartum Will offer postpartum  4. [redacted] weeks gestation of pregnancy   Preterm labor symptoms and general obstetric precautions including but not limited to vaginal bleeding, contractions, leaking of fluid and fetal movement were reviewed in detail with the patient. I discussed the assessment and treatment plan with the patient. The patient was provided an opportunity to ask questions and all were answered. The patient agreed with the plan and demonstrated an understanding of the instructions. The patient was advised to call back or seek an in-person office evaluation/go to MAU at [facility] for any urgent or concerning symptoms. Please refer to After Visit Summary for other counseling recommendations.   I provided 15 minutes of face-to-face time during this encounter.  Return in about 2 weeks (around 11/11/2023) for Virtual, ROB, Low risk.  Future Appointments  Date Time Provider Department Center  11/14/2023 11:15 AM Reva Bores, MD [facility]  11/25/2023 11:15 AM Macon Large, Jethro Bastos, MD [facility]  12/09/2023 10:35 AM Anyanwu, Jethro Bastos, MD [facility]    Catalina Antigua, MD Center for [facility], [facility]

## 2023-10-28 NOTE — Progress Notes (Deleted)
   OBSTETRICS PRENATAL VIRTUAL VISIT ENCOUNTER NOTE  Provider location: Center for [facility] at [facility]   Patient location: Home  I connected with Pamela Doyle on 10/28/23 at  9:55 AM EST by MyChart Video Encounter and verified that I am speaking with the correct person using two identifiers. I discussed the limitations, risks, security and privacy concerns of performing an evaluation and management service virtually and the availability of in person appointments. I also discussed with the patient that there may be a patient responsible charge related to this service. The patient expressed understanding and agreed to proceed. Subjective:  [name] is a 27 y.o. [id number] at [redacted]w[redacted]d being seen today for ongoing prenatal care.  She is currently monitored for the following issues for this {Blank single:19197::"high-risk","low-risk"} pregnancy and has Generalized anxiety disorder; Supervision of other normal pregnancy, antepartum; History of gestational diabetes; Rubella non-immune status, antepartum; Gestational thrombocytopenia (HCC); and Urinary tract infection in mother during second trimester of pregnancy on their problem list.  Patient reports {sx:[id number]}.   .  .   . Denies any leaking of fluid.   The following portions of the patient's history were reviewed and updated as appropriate: allergies, current medications, past family history, past medical history, past social history, past surgical history and problem list.   Objective:  There were no vitals filed for this visit.  Fetal Status:           General:  Alert, oriented and cooperative. Patient is in no acute distress.  Respiratory: Normal respiratory effort, no problems with respiration noted  Mental Status: Normal mood and affect. Normal behavior. Normal judgment and thought content.  Rest of physical exam deferred due to type of encounter  Imaging: No results found.  Assessment and Plan:  Pregnancy: [id number] at  [redacted]w[redacted]d There are no diagnoses linked to this encounter. {Blank single:19197::"Term","Preterm"} labor symptoms and general obstetric precautions including but not limited to vaginal bleeding, contractions, leaking of fluid and fetal movement were reviewed in detail with the patient. I discussed the assessment and treatment plan with the patient. The patient was provided an opportunity to ask questions and all were answered. The patient agreed with the plan and demonstrated an understanding of the instructions. The patient was advised to call back or seek an in-person office evaluation/go to [facility]U at [facility] for any urgent or concerning symptoms. Please refer to After Visit Summary for other counseling recommendations.   I provided *** minutes of face-to-face time during this encounter.  No follow-ups on file.  Future Appointments  Date Time Provider Department Center  11/14/2023 11:15 AM Reva Bores, MD [facility]  11/25/2023 11:15 AM Macon Large, Jethro Bastos, MD [facility]  12/09/2023 10:35 AM Anyanwu, Jethro Bastos, MD [facility]    Kennon Portela, CMA Center for [employer], [facility]

## 2023-11-14 ENCOUNTER — Ambulatory Visit (INDEPENDENT_AMBULATORY_CARE_PROVIDER_SITE_OTHER): Payer: BC Managed Care – PPO | Admitting: Family Medicine

## 2023-11-14 VITALS — BP 122/78 | HR 74 | Wt 223.2 lb

## 2023-11-14 DIAGNOSIS — O09899 Supervision of other high risk pregnancies, unspecified trimester: Secondary | ICD-10-CM

## 2023-11-14 DIAGNOSIS — Z3480 Encounter for supervision of other normal pregnancy, unspecified trimester: Secondary | ICD-10-CM

## 2023-11-14 DIAGNOSIS — Z2839 Other underimmunization status: Secondary | ICD-10-CM

## 2023-11-14 DIAGNOSIS — Z3A29 29 weeks gestation of pregnancy: Secondary | ICD-10-CM

## 2023-11-14 NOTE — Progress Notes (Signed)
    PRENATAL VISIT NOTE  Subjective:  Pamela Doyle is a 27 y.o. [id number] at [redacted]w[redacted]d being seen today for ongoing prenatal care.  She is currently monitored for the following issues for this low-risk pregnancy and has Generalized anxiety disorder; Supervision of other normal pregnancy, antepartum; History of gestational diabetes; Rubella non-immune status, antepartum; Gestational thrombocytopenia (HCC); and Urinary tract infection in mother during second trimester of pregnancy on their problem list.  Patient reports no complaints.  Contractions: Not present. Vag. Bleeding: None.  Movement: Present. Denies leaking of fluid.   The following portions of the patient's history were reviewed and updated as appropriate: allergies, current medications, past family history, past medical history, past social history, past surgical history and problem list.   Objective:   Vitals:   11/14/23 1106  BP: 122/78  Pulse: 74  Weight: 223 lb 3.2 oz (101.2 kg)    Fetal Status: Fetal Heart Rate (bpm): 142 Fundal Height: 32 cm Movement: Present     General:  Alert, oriented and cooperative. Patient is in no acute distress.  Skin: Skin is warm and dry. No rash noted.   Cardiovascular: Normal heart rate noted  Respiratory: Normal respiratory effort, no problems with respiration noted  Abdomen: Soft, gravid, appropriate for gestational age.  Pain/Pressure: Absent     Pelvic: Cervical exam deferred        Extremities: Normal range of motion.  Edema: None  Mental Status: Normal mood and affect. Normal behavior. Normal judgment and thought content.   Assessment and Plan:  Pregnancy: J4N8295 at [redacted]w[redacted]d 1. Supervision of other normal pregnancy, antepartum (Primary) Continue routine prenatal care.  2. Rubella non-immune status, antepartum MMR pp  3. [redacted] weeks gestation of pregnancy   Preterm labor symptoms and general obstetric precautions including but not limited to vaginal bleeding, contractions, leaking of  fluid and fetal movement were reviewed in detail with the patient. Please refer to After Visit Summary for other counseling recommendations.   Return in 2 weeks (on 11/28/2023).  Future Appointments  Date Time Provider Department Center  11/25/2023 11:15 AM Anyanwu, Jethro Bastos, MD [facility]  12/09/2023 10:35 AM Anyanwu, Jethro Bastos, MD [facility]    Reva Bores, MD

## 2023-11-14 NOTE — Progress Notes (Signed)
ROB: Needing proof of prenatal care for tiny toes

## 2023-11-19 NOTE — L&D Delivery Note (Signed)
 OB/GYN Faculty Practice Delivery Note  Pamela Doyle is a 28 y.o. [id number] s/p SVD at [redacted]w[redacted]d. She was admitted for PROM.   ROM: 22h 6m with clear fluid GBS Status:  Negative/-- (02/11 1322) Maximum Maternal Temperature: 98.71F  Labor Progress: Initial SVE: 3/50/-2. Augmented with AROM of forebag, and pitocin. She then progressed to complete @2032 .   Delivery Date/Time: 01/17/2024 @2212   Delivery: Called to room and patient was complete and pushing. Head delivered LOA. Loose nuchal cord present, delivered through. Shoulder and body delivered in usual fashion. Infant with spontaneous cry, placed on mother's abdomen, dried and stimulated. Cord clamped x 2 after 1-minute delay, and cut by FOB. Cord blood drawn. Placenta delivered spontaneously with gentle cord traction. Fundus firm with massage and Pitocin. Labia, perineum, vagina, and cervix inspected without lesion.   Baby Weight: 3720g  Placenta: 3 vessel, intact. Sent to L&D  Complications: None Lacerations: None EBL: 112 mL Analgesia: Epidural   Infant:  APGAR (1 MIN):  8 APGAR (5 MINS):  9  Wyn Forster, MD OB Family Medicine Fellow, [facility] for [facility], [facility] 01/18/2024, 4:16 AM

## 2023-11-25 ENCOUNTER — Telehealth: Payer: BC Managed Care – PPO | Admitting: Obstetrics & Gynecology

## 2023-11-25 ENCOUNTER — Encounter: Payer: Self-pay | Admitting: Obstetrics & Gynecology

## 2023-11-25 DIAGNOSIS — Z3A31 31 weeks gestation of pregnancy: Secondary | ICD-10-CM

## 2023-11-25 DIAGNOSIS — D696 Thrombocytopenia, unspecified: Secondary | ICD-10-CM

## 2023-11-25 DIAGNOSIS — O91219 Nonpurulent mastitis associated with pregnancy, unspecified trimester: Secondary | ICD-10-CM

## 2023-11-25 DIAGNOSIS — Z3480 Encounter for supervision of other normal pregnancy, unspecified trimester: Secondary | ICD-10-CM

## 2023-11-25 DIAGNOSIS — O99113 Other diseases of the blood and blood-forming organs and certain disorders involving the immune mechanism complicating pregnancy, third trimester: Secondary | ICD-10-CM

## 2023-11-25 MED ORDER — CEFADROXIL 500 MG PO CAPS: 500.0000 mg | ORAL_CAPSULE | Freq: Two times a day (BID) | ORAL | 0 refills | Status: DC

## 2023-11-25 NOTE — Progress Notes (Signed)
 OBSTETRICS PRENATAL VIRTUAL VISIT ENCOUNTER NOTE  Provider location: Center for [facility] at [facility]   Patient location: Home  I connected with Pamela Doyle on 11/25/23 at 11:15 AM EST by MyChart Video Encounter and verified that I am speaking with the correct person using two identifiers. I discussed the limitations, risks, security and privacy concerns of performing an evaluation and management service virtually and the availability of in person appointments. I also discussed with the patient that there may be a patient responsible charge related to this service. The patient expressed understanding and agreed to proceed. Subjective:  Pamela Doyle is a 28 y.o. [id number] at [redacted]w[redacted]d being seen today for ongoing prenatal care.  Was switched from office visit due to childcare issues. She is currently monitored for the following issues for this low-risk pregnancy and has Generalized anxiety disorder; Supervision of other normal pregnancy, antepartum; History of gestational diabetes; Rubella non-immune status, antepartum; Gestational thrombocytopenia (HCC); and Urinary tract infection in mother during second trimester of pregnancy on their problem list.  Patient reports  right breast leaking of yellow fluid and tenderness .  Usually starts having breast mild leaking about a week or two before delivery, but this started earlier than usual and now this is yellow in color and thick, and is painful.  She is concerned about infection.  Contractions: Not present. Vag. Bleeding: None.  Movement: Present. Denies any leaking of fluid.   The following portions of the patient's history were reviewed and updated as appropriate: allergies, current medications, past family history, past medical history, past social history, past surgical history and problem list.   Objective:  There were no vitals filed for this visit.  Fetal Status:     Movement: Present     General:  Alert, oriented and cooperative.  Patient is in no acute distress.  Respiratory: Normal respiratory effort, no problems with respiration noted  Mental Status: Normal mood and affect. Normal behavior. Normal judgment and thought content.  Rest of physical exam deferred due to type of encounter  Imaging: No results found.  Assessment and Plan:  Pregnancy: [id number] at [redacted]w[redacted]d 1. Possible right breast mastitis during pregnancy (Primary) Symptoms are concerned about mastitis, will treat. Cefadroxil  ordered. She was told to come in for evaluation/go to MAU for fevers or worsening symptoms.  - cefadroxil  (DURICEF) 500 MG capsule; Take 1 capsule (500 mg total) by mouth 2 (two) times daily.  Dispense: 14 capsule; Refill: 0  2. Benign gestational thrombocytopenia in third trimester (HCC) 10/14/23 count was 119K, will recheck at next visit  3. [redacted] weeks gestation of pregnancy 4. Supervision of other normal pregnancy, antepartum No other concerns . Preterm labor symptoms and general obstetric precautions including but not limited to vaginal bleeding, contractions, leaking of fluid and fetal movement were reviewed in detail with the patient. I discussed the assessment and treatment plan with the patient. The patient was provided an opportunity to ask questions and all were answered. The patient agreed with the plan and demonstrated an understanding of the instructions. The patient was advised to call back or seek an in-person office evaluation/go to [facility]U at [facility] for any urgent or concerning symptoms. Please refer to After Visit Summary for other counseling recommendations.   I provided 12 minutes of face-to-face time during this encounter.  Return in about 2 weeks (around 12/09/2023) for OB visit as scheduled.  Future Appointments  Date Time Provider Department Center  12/09/2023 10:35 AM Sharyon Peitz, Gloris LABOR, MD [facility]  [facility]    Gloris Hugger, MD Center for [employer], [facility], [facility]

## 2023-12-09 ENCOUNTER — Ambulatory Visit (INDEPENDENT_AMBULATORY_CARE_PROVIDER_SITE_OTHER): Payer: BC Managed Care – PPO | Admitting: Obstetrics & Gynecology

## 2023-12-09 ENCOUNTER — Encounter: Payer: Self-pay | Admitting: Obstetrics & Gynecology

## 2023-12-09 VITALS — BP 122/77 | HR 83 | Wt 220.0 lb

## 2023-12-09 DIAGNOSIS — O99113 Other diseases of the blood and blood-forming organs and certain disorders involving the immune mechanism complicating pregnancy, third trimester: Secondary | ICD-10-CM | POA: Diagnosis not present

## 2023-12-09 DIAGNOSIS — D696 Thrombocytopenia, unspecified: Secondary | ICD-10-CM

## 2023-12-09 DIAGNOSIS — Z23 Encounter for immunization: Secondary | ICD-10-CM

## 2023-12-09 DIAGNOSIS — Z3480 Encounter for supervision of other normal pregnancy, unspecified trimester: Secondary | ICD-10-CM

## 2023-12-09 DIAGNOSIS — Z3A33 33 weeks gestation of pregnancy: Secondary | ICD-10-CM

## 2023-12-09 NOTE — Progress Notes (Signed)
   PRENATAL VISIT NOTE  Subjective:  Pamela Doyle is a 28 y.o. [id number] at [redacted]w[redacted]d being seen today for ongoing prenatal care.  She is currently monitored for the following issues for this low-risk pregnancy and has Generalized anxiety disorder; Supervision of other normal pregnancy, antepartum; Rubella non-immune status, antepartum; Gestational thrombocytopenia (HCC); and Urinary tract infection in mother during second trimester of pregnancy on their problem list.  Patient reports no complaints.  Contractions: Irregular. Vag. Bleeding: None.  Movement: Present. Denies leaking of fluid.   The following portions of the patient's history were reviewed and updated as appropriate: allergies, current medications, past family history, past medical history, past social history, past surgical history and problem list.   Objective:   Vitals:   12/09/23 1016  BP: 122/77  Pulse: 83  Weight: 220 lb (99.8 kg)    Fetal Status: Fetal Heart Rate (bpm): 135 Fundal Height: 35 cm Movement: Present     General:  Alert, oriented and cooperative. Patient is in no acute distress.  Skin: Skin is warm and dry. No rash noted.   Cardiovascular: Normal heart rate noted  Respiratory: Normal respiratory effort, no problems with respiration noted  Abdomen: Soft, gravid, appropriate for gestational age.  Pain/Pressure: Present     Pelvic: Cervical exam deferred        Extremities: Normal range of motion.  Edema: None  Mental Status: Normal mood and affect. Normal behavior. Normal judgment and thought content.   Assessment and Plan:  Pregnancy: A5W0981 at [redacted]w[redacted]d 1. Benign gestational thrombocytopenia in third trimester [facility]) (Primary)    Latest Ref Rng & Units 10/14/2023    8:15 AM 07/10/2023    2:41 PM 06/20/2021    5:31 AM  CBC  WBC 3.4 - 10.8 x10E3/uL 8.5  8.4  9.6   Hemoglobin 11.1 - 15.9 g/dL 19.1  47.8  9.6   Hematocrit 34.0 - 46.6 % 36.1  40.0  30.0   Platelets 150 - 450 x10E3/uL 119  149  101   - CBC  checked today, will follow up results and manage accordingly.  2. Need for Tdap vaccination - Tdap vaccine greater than or equal to 7yo IM given today after counseling.  2. [redacted] weeks gestation of pregnancy 3. Supervision of other normal pregnancy, antepartum Declined RSV vaccine. No other concerns. Preterm labor symptoms and general obstetric precautions including but not limited to vaginal bleeding, contractions, leaking of fluid and fetal movement were reviewed in detail with the patient. Please refer to After Visit Summary for other counseling recommendations.   Return in about 2 weeks (around 12/23/2023) for Pelvic cultures, OFFICE OB VISIT (MD or APP).  No future appointments.   Jaynie Collins, MD

## 2023-12-09 NOTE — Patient Instructions (Signed)
Return to office for any scheduled appointments. Call the office or go to the MAU at [facility] at [facility] if: You begin to have strong, frequent contractions Your water breaks.  Sometimes it is a big gush of fluid, sometimes it is just a trickle that keeps getting your underwear wet or running down your legs You have vaginal bleeding.  It is normal to have a small amount of spotting if your cervix was checked.  You do not feel your baby moving like normal.  If you do not, get something to eat and drink and lay down and focus on feeling your baby move.   If your baby is still not moving like normal, you should call the office or go to MAU. Any other obstetric concerns.    RSV Vaccination for Pregnant People  CDC recommends two ways to protect babies from getting very sick with Respiratory Syncytial Virus (RSV):  An RSV vaccination given during pregnancy  Pfizer's vaccine Verdis Frederickson) is recommended for use during pregnancy. It is given during RSV season to people who are 32 through [redacted] weeks pregnant.  Or, An RSV immunization given directly to infants and some older babies  Babies born to mothers who get RSV vaccine at least 2 weeks before delivery will have protection and, in most cases, should not need an RSV immunization later.    When is RSV season?  In most regions of the [place] RSV season starts in the fall and peaks in the winter, but the timing and severity of RSV season can vary from place to place and year to year.   The goal of maternal RSV vaccination is to protect babies from getting very sick with RSV during their first RSV season.  In most of the [place], this means maternal RSV vaccine will be given in September through January.  Who should get the maternal RSV vaccine?  People who are 1 through [redacted] weeks pregnant during September through January should get one dose of maternal RSV vaccine to protect their babies. RSV season can  vary around the country.   How is the maternal RSV vaccine administered?  Maternal RSV vaccine is given as a shot into the mother's upper arm. Only a single dose (one shot) of maternal RSV vaccine is recommended.   It is not yet known whether another dose might be needed in later pregnancies.  How well does the maternal RSV vaccine work?  When someone gets RSV vaccine, their body responds by making a protein that protects against the virus that causes RSV. The process takes about 2 weeks. When a pregnant person gets RSV vaccine, their protective proteins (called antibodies) also pass to their baby. So, babies who are born at least 2 weeks after their mother gets RSV vaccine are protected at birth, when infants are at the highest risk of severe RSV disease.   The vaccine can reduce a baby's risk of being hospitalized from RSV by 57% in the first six months after birth.  What are the possible side effects of the maternal RSV vaccine?  In the clinical trials, the side effects most often reported by pregnant people who received the maternal RSV vaccine were pain at the injection site, headache, muscle pain, and nausea.  Although not common, a dangerous high blood pressure condition called pre-eclampsia occurred in 1.8% of pregnant people who received the maternal RSV vaccine compared to 1.4% of pregnant people who received a placebo.  The clinical trials  a small increase in the number of preterm births in vaccinated pregnant people. It is not clear if this is a true safety problem related to RSV vaccine or if this occurred for reasons unrelated to vaccination.  To reduce the potential risk of preterm birth and complications from RSV disease, FDA approved the maternal RSV vaccine for use during weeks 32 through 36 of pregnancy while additional studies are conducted.  FDA is requiring the manufacturer to do additional studies that will look more closely at the potential risk of preterm  births and pregnancy-related high blood pressure issues in mothers, including pre-eclampsia.  Severe allergic reactions to vaccines are rare but can happen after any vaccine and can be life-threatening. If you see signs of a severe allergic reaction after vaccination (hives, swelling of the face and throat, difficulty breathing, a fast heartbeat, dizziness, or weakness), seek immediate medical care by calling 911.  As with any medicine or vaccine there is a very remote chance of the vaccine causing other serious injury or death after vaccination.  Adverse events following vaccination should be reported to the Vaccine Adverse Event Reporting System (VAERS), even if it's not clear that the vaccine caused the adverse event. You or your doctor can report an adverse event to CDC and FDA through VAERS. If you need further assistance reporting to VAERS, please email info@VAERS.org or call 1-800-822-7967.  If you have any questions about side effects from the maternal RSV vaccine, talk with your healthcare provider.  Do I need a prescription for a maternal RSV vaccine?  Until the vaccine available in the office, you will need a prescription to take to a local pharmacy that is providing the vaccine.   How do I pay for the maternal RSV vaccine?  Most private health insurance plans cover the maternal RSV vaccine, but there may be a cost to you depending on your plan.  Contact your insurer to find out.  Medicaid Beginning August 18, 2022, most people with coverage from Medicaid and Children's Health Insurance Program (CHIP) will be guaranteed coverage of all vaccines recommended by the Advisory Committee on Immunization Practice at no cost to them.   Source: National Center for Immunization and Respiratory Diseases  

## 2023-12-10 ENCOUNTER — Encounter: Payer: Self-pay | Admitting: Obstetrics & Gynecology

## 2023-12-10 LAB — CBC
Hematocrit: 36.5 % (ref 34.0–46.6)
Hemoglobin: 12.2 g/dL (ref 11.1–15.9)
MCH: 29.9 pg (ref 26.6–33.0)
MCHC: 33.4 g/dL (ref 31.5–35.7)
MCV: 90 fL (ref 79–97)
Platelets: 102 10*3/uL — ABNORMAL LOW (ref 150–450)
RBC: 4.08 x10E6/uL (ref 3.77–5.28)
RDW: 13.1 % (ref 11.7–15.4)
WBC: 8.2 10*3/uL (ref 3.4–10.8)

## 2023-12-12 ENCOUNTER — Telehealth: Payer: BC Managed Care – PPO

## 2023-12-22 NOTE — Progress Notes (Signed)
   PRENATAL VISIT NOTE  Subjective:  Pamela Doyle is a 28 y.o. [id number] at [redacted]w[redacted]d being seen today for ongoing prenatal care.  She is currently monitored for the following issues for this low-risk pregnancy and has Generalized anxiety disorder; Supervision of other normal pregnancy, antepartum; Rubella non-immune status, antepartum; Gestational thrombocytopenia (HCC); and Urinary tract infection in mother during second trimester of pregnancy on their problem list.  Patient reports no complaints.  Contractions: Not present. Vag. Bleeding: None.  Movement: Present. Denies leaking of fluid.   The following portions of the patient's history were reviewed and updated as appropriate: allergies, current medications, past family history, past medical history, past social history, past surgical history and problem list.   Objective:   Vitals:   12/23/23 1056  BP: 122/76  Pulse: 78  Weight: 227 lb 9.6 oz (103.2 kg)    Fetal Status: Fetal Heart Rate (bpm): 143 Fundal Height: 37 cm Movement: Present     General:  Alert, oriented and cooperative. Patient is in no acute distress.  Skin: Skin is warm and dry. No rash noted.   Cardiovascular: Normal heart rate noted  Respiratory: Normal respiratory effort, no problems with respiration noted  Abdomen: Soft, gravid, appropriate for gestational age.  Pain/Pressure: Present      Assessment and Plan:  Pregnancy: [id number] at [redacted]w[redacted]d 1. Supervision of other normal pregnancy, antepartum (Primary) 2. [redacted] weeks gestation of pregnancy Discussed GBS/GC/CT next visit  3. Benign gestational thrombocytopenia in third trimester (HCC) Plt 102 on 1/21 Repeat at 37-38w  4. Rubella non-immune status, antepartum PP MMR  5. Generalized anxiety disorder Denies mood concerns today, continue prozac  40mg  daily  Preterm labor symptoms and general obstetric precautions including but not limited to vaginal bleeding, contractions, leaking of fluid and fetal movement were  reviewed in detail with the patient. Please refer to After Visit Summary for other counseling recommendations.   Return in about 1 week (around 12/30/2023) for return OB at 36 weeks with GBS/GC/CT.  Future Appointments  Date Time Provider Department Center  12/30/2023 10:55 AM Anyanwu, Gloris LABOR, MD [facility]  01/06/2024 10:55 AM Izell Harari, MD [facility]  01/13/2024 10:55 AM Herchel, Gloris LABOR, MD [facility]  01/20/2024 10:55 AM Anyanwu, Gloris LABOR, MD [facility]    Kieth JAYSON Carolin, MD

## 2023-12-23 ENCOUNTER — Ambulatory Visit (INDEPENDENT_AMBULATORY_CARE_PROVIDER_SITE_OTHER): Payer: BC Managed Care – PPO | Admitting: Obstetrics and Gynecology

## 2023-12-23 VITALS — BP 122/76 | HR 78 | Wt 227.6 lb

## 2023-12-23 DIAGNOSIS — O09899 Supervision of other high risk pregnancies, unspecified trimester: Secondary | ICD-10-CM | POA: Diagnosis not present

## 2023-12-23 DIAGNOSIS — Z3480 Encounter for supervision of other normal pregnancy, unspecified trimester: Secondary | ICD-10-CM | POA: Diagnosis not present

## 2023-12-23 DIAGNOSIS — Z2839 Other underimmunization status: Secondary | ICD-10-CM

## 2023-12-23 DIAGNOSIS — Z3A35 35 weeks gestation of pregnancy: Secondary | ICD-10-CM

## 2023-12-23 DIAGNOSIS — F411 Generalized anxiety disorder: Secondary | ICD-10-CM

## 2023-12-23 DIAGNOSIS — D696 Thrombocytopenia, unspecified: Secondary | ICD-10-CM

## 2023-12-23 DIAGNOSIS — O99113 Other diseases of the blood and blood-forming organs and certain disorders involving the immune mechanism complicating pregnancy, third trimester: Secondary | ICD-10-CM

## 2023-12-30 ENCOUNTER — Other Ambulatory Visit (HOSPITAL_COMMUNITY)
Admission: RE | Admit: 2023-12-30 | Discharge: 2023-12-30 | Disposition: A | Discharge status: Home / Self Care | Payer: BC Managed Care – PPO | Source: Ambulatory Visit | Attending: Obstetrics & Gynecology | Admitting: Obstetrics & Gynecology

## 2023-12-30 ENCOUNTER — Encounter: Payer: Self-pay | Admitting: Obstetrics & Gynecology

## 2023-12-30 ENCOUNTER — Ambulatory Visit (INDEPENDENT_AMBULATORY_CARE_PROVIDER_SITE_OTHER): Payer: BC Managed Care – PPO | Admitting: Obstetrics & Gynecology

## 2023-12-30 VITALS — BP 103/71 | HR 81 | Wt 227.6 lb

## 2023-12-30 DIAGNOSIS — Z3480 Encounter for supervision of other normal pregnancy, unspecified trimester: Secondary | ICD-10-CM | POA: Diagnosis not present

## 2023-12-30 DIAGNOSIS — Z3A36 36 weeks gestation of pregnancy: Secondary | ICD-10-CM | POA: Insufficient documentation

## 2023-12-30 DIAGNOSIS — D696 Thrombocytopenia, unspecified: Secondary | ICD-10-CM | POA: Diagnosis not present

## 2023-12-30 DIAGNOSIS — O99113 Other diseases of the blood and blood-forming organs and certain disorders involving the immune mechanism complicating pregnancy, third trimester: Secondary | ICD-10-CM | POA: Diagnosis not present

## 2023-12-30 DIAGNOSIS — Z3483 Encounter for supervision of other normal pregnancy, third trimester: Secondary | ICD-10-CM | POA: Insufficient documentation

## 2023-12-30 NOTE — Patient Instructions (Signed)

## 2023-12-30 NOTE — Progress Notes (Signed)
   PRENATAL VISIT NOTE  Subjective:  Pamela Doyle is a 28 y.o. [id number] at [redacted]w[redacted]d being seen today for ongoing prenatal care.  She is currently monitored for the following issues for this high-risk pregnancy and has Generalized anxiety disorder; Supervision of other normal pregnancy, antepartum; Rubella non-immune status, antepartum; Gestational thrombocytopenia (HCC); and Urinary tract infection in mother during second trimester of pregnancy on their problem list.  Patient reports no complaints.  Contractions: Not present. Vag. Bleeding: None.  Movement: Present. Denies leaking of fluid.   The following portions of the patient's history were reviewed and updated as appropriate: allergies, current medications, past family history, past medical history, past social history, past surgical history and problem list.   Objective:   Vitals:   12/30/23 1057  BP: 103/71  Pulse: 81  Weight: 227 lb 9.6 oz (103.2 kg)    Fetal Status: Fetal Heart Rate (bpm): 135 Fundal Height: 37 cm Movement: Present  Presentation: Vertex  General:  Alert, oriented and cooperative. Patient is in no acute distress.  Skin: Skin is warm and dry. No rash noted.   Cardiovascular: Normal heart rate noted  Respiratory: Normal respiratory effort, no problems with respiration noted  Abdomen: Soft, gravid, appropriate for gestational age.  Pain/Pressure: Present     Pelvic: Cervical exam deferred Dilation: 1 Effacement (%): Thick Station: Ballotable, cultures done.  Extremities: Normal range of motion.  Edema: None  Mental Status: Normal mood and affect. Normal behavior. Normal judgment and thought content.   Assessment and Plan:  Pregnancy: [id number] at [redacted]w[redacted]d 1. Benign gestational thrombocytopenia in third trimester [facility]) (Primary)    Latest Ref Rng & Units 12/09/2023   10:55 AM 10/14/2023    8:15 AM 07/10/2023    2:41 PM  CBC  WBC 3.4 - 10.8 x10E3/uL 8.2  8.5  8.4   Hemoglobin 11.1 - 15.9 g/dL 52.8  41.3  24.4    Hematocrit 34.0 - 46.6 % 36.5  36.1  40.0   Platelets 150 - 450 x10E3/uL 102  119  149   Will check labs next visit.  2. [redacted] weeks gestation of pregnancy 3. Supervision of other normal pregnancy, antepartum Pelvic cultures done, will follow up results and manage accordingly. - Cervicovaginal ancillary only - Strep Gp B NAA Preterm labor symptoms and general obstetric precautions including but not limited to vaginal bleeding, contractions, leaking of fluid and fetal movement were reviewed in detail with the patient. Please refer to After Visit Summary for other counseling recommendations.   Return in about 1 week (around 01/06/2024) for OFFICE OB VISIT (MD only).  Future Appointments  Date Time Provider Department Center  01/06/2024 10:55 AM Waubay Bing, MD [facility]  01/13/2024 10:55 AM Macel Yearsley, Jethro Bastos, MD [facility]  01/20/2024 10:55 AM Afrika Brick, Jethro Bastos, MD [facility]    Jaynie Collins, MD

## 2023-12-31 LAB — CERVICOVAGINAL ANCILLARY ONLY
Chlamydia: NEGATIVE
Comment: NEGATIVE
Comment: NORMAL
Neisseria Gonorrhea: NEGATIVE

## 2024-01-01 LAB — STREP GP B NAA: Strep Gp B NAA: NEGATIVE

## 2024-01-02 ENCOUNTER — Encounter: Payer: Self-pay | Admitting: Obstetrics & Gynecology

## 2024-01-06 ENCOUNTER — Ambulatory Visit (INDEPENDENT_AMBULATORY_CARE_PROVIDER_SITE_OTHER): Payer: BC Managed Care – PPO | Admitting: Obstetrics and Gynecology

## 2024-01-06 VITALS — BP 115/79 | HR 84 | Wt 228.4 lb

## 2024-01-06 DIAGNOSIS — Z3480 Encounter for supervision of other normal pregnancy, unspecified trimester: Secondary | ICD-10-CM

## 2024-01-06 DIAGNOSIS — O2342 Unspecified infection of urinary tract in pregnancy, second trimester: Secondary | ICD-10-CM

## 2024-01-06 DIAGNOSIS — O99113 Other diseases of the blood and blood-forming organs and certain disorders involving the immune mechanism complicating pregnancy, third trimester: Secondary | ICD-10-CM | POA: Diagnosis not present

## 2024-01-06 DIAGNOSIS — D696 Thrombocytopenia, unspecified: Secondary | ICD-10-CM

## 2024-01-06 DIAGNOSIS — Z3A37 37 weeks gestation of pregnancy: Secondary | ICD-10-CM

## 2024-01-06 NOTE — Progress Notes (Unsigned)
 ROB: Wants cervix checked

## 2024-01-07 ENCOUNTER — Encounter: Payer: Self-pay | Admitting: Obstetrics and Gynecology

## 2024-01-07 LAB — CBC
Hematocrit: 37.4 % (ref 34.0–46.6)
Hemoglobin: 12.5 g/dL (ref 11.1–15.9)
MCH: 29.8 pg (ref 26.6–33.0)
MCHC: 33.4 g/dL (ref 31.5–35.7)
MCV: 89 fL (ref 79–97)
Platelets: 119 10*3/uL — ABNORMAL LOW (ref 150–450)
RBC: 4.2 x10E6/uL (ref 3.77–5.28)
RDW: 13.4 % (ref 11.7–15.4)
WBC: 8.6 10*3/uL (ref 3.4–10.8)

## 2024-01-07 NOTE — Progress Notes (Signed)
   PRENATAL VISIT NOTE  Subjective:  Pamela Doyle is a 28 y.o. [id number] at [redacted]w[redacted]d being seen today for ongoing prenatal care.  She is currently monitored for the following issues for this high-risk pregnancy and has Generalized anxiety disorder; Supervision of other normal pregnancy, antepartum; Rubella non-immune status, antepartum; Gestational thrombocytopenia (HCC); and Urinary tract infection in mother during second trimester of pregnancy on their problem list.  Patient reports no complaints.  Contractions: Irregular. Vag. Bleeding: None.  Movement: Present. Denies leaking of fluid.   The following portions of the patient's history were reviewed and updated as appropriate: allergies, current medications, past family history, past medical history, past social history, past surgical history and problem list.   Objective:   Vitals:   01/06/24 1052  BP: 115/79  Pulse: 84  Weight: 228 lb 6.4 oz (103.6 kg)    Fetal Status: Fetal Heart Rate (bpm): 126 Fundal Height: 37 cm Movement: Present  Presentation: Vertex  General:  Alert, oriented and cooperative. Patient is in no acute distress.  Skin: Skin is warm and dry. No rash noted.   Cardiovascular: Normal heart rate noted  Respiratory: Normal respiratory effort, no problems with respiration noted  Abdomen: Soft, gravid, appropriate for gestational age.  Pain/Pressure: Present     Pelvic: Cervical exam deferred Dilation: 1 Effacement (%): Thick Station: Ballotable  Extremities: Normal range of motion.  Edema: None  Mental Status: Normal mood and affect. Normal behavior. Normal judgment and thought content.   Assessment and Plan:  Pregnancy: [id number] at [redacted]w[redacted]d 1. Benign gestational thrombocytopenia in third trimester (HCC) (Primary) Continue weekly checks. D/w her re: okay for 39wk IOL if she desires. Last plt 102 and I told her that if goes below 100, definitely recommend 39wk IOL but if stays above 100 then reasonable to go to 41wks. Pt to  consider; f/u next week  2. [redacted] weeks gestation of pregnancy Vasectomy; BTL if needs c/s for some reason. GBS neg - CBC  3. Supervision of other normal pregnancy, antepartum  Term labor symptoms and general obstetric precautions including but not limited to vaginal bleeding, contractions, leaking of fluid and fetal movement were reviewed in detail with the patient. Please refer to After Visit Summary for other counseling recommendations.   Return in about 1 week (around 01/13/2024) for in person, md or app, high risk ob.  Future Appointments  Date Time Provider Department Center  01/13/2024 10:55 AM Anyanwu, Jethro Bastos, MD [facility]  01/20/2024 10:55 AM Anyanwu, Jethro Bastos, MD [facility]    Clyde Bing, MD

## 2024-01-13 ENCOUNTER — Ambulatory Visit: Payer: BC Managed Care – PPO | Admitting: Obstetrics & Gynecology

## 2024-01-13 ENCOUNTER — Encounter: Payer: Self-pay | Admitting: Obstetrics & Gynecology

## 2024-01-13 VITALS — BP 123/75 | HR 82 | Wt 228.6 lb

## 2024-01-13 DIAGNOSIS — Z3480 Encounter for supervision of other normal pregnancy, unspecified trimester: Secondary | ICD-10-CM | POA: Diagnosis not present

## 2024-01-13 DIAGNOSIS — O99113 Other diseases of the blood and blood-forming organs and certain disorders involving the immune mechanism complicating pregnancy, third trimester: Secondary | ICD-10-CM | POA: Diagnosis not present

## 2024-01-13 DIAGNOSIS — Z3A38 38 weeks gestation of pregnancy: Secondary | ICD-10-CM | POA: Diagnosis not present

## 2024-01-13 DIAGNOSIS — D696 Thrombocytopenia, unspecified: Secondary | ICD-10-CM

## 2024-01-13 NOTE — Progress Notes (Signed)
   PRENATAL VISIT NOTE  Subjective:  Pamela Doyle is a 27 y.o. [id number] at [redacted]w[redacted]d being seen today for ongoing prenatal care.  She is currently monitored for the following issues for this low-risk pregnancy and has Generalized anxiety disorder; Supervision of other normal pregnancy, antepartum; Rubella non-immune status, antepartum; Gestational thrombocytopenia (HCC); and Urinary tract infection in mother during second trimester of pregnancy on their problem list.  Patient reports occasional contractions, getting stronger since last night.  Contractions: Irregular. Vag. Bleeding: Bloody Show.  Movement: Present. Denies leaking of fluid.   The following portions of the patient's history were reviewed and updated as appropriate: allergies, current medications, past family history, past medical history, past social history, past surgical history and problem list.   Objective:   Vitals:   01/13/24 0949  BP: 123/75  Pulse: 82  Weight: 228 lb 9.6 oz (103.7 kg)    Fetal Status: Fetal Heart Rate (bpm): 130 Fundal Height: 39 cm Movement: Present  Presentation: Vertex  General:  Alert, oriented and cooperative. Patient is in no acute distress.  Skin: Skin is warm and dry. No rash noted.   Cardiovascular: Normal heart rate noted  Respiratory: Normal respiratory effort, no problems with respiration noted  Abdomen: Soft, gravid, appropriate for gestational age.  Pain/Pressure: Present     Pelvic: Cervical exam performed in the presence of a chaperone Dilation: 3 Effacement (%): 50 Station: -3 with some bloody show  Extremities: Normal range of motion.  Edema: None  Mental Status: Normal mood and affect. Normal behavior. Normal judgment and thought content.   Assessment and Plan:  Pregnancy: [id number] at [redacted]w[redacted]d 1. Benign gestational thrombocytopenia in third trimester (HCC) (Primary) Stable platelet count of 119K last week.  2. [redacted] weeks gestation of pregnancy 3. Supervision of other normal pregnancy,  antepartum Favorable cervix, likely in early/latent labor.  Labor symptoms and general obstetric precautions including but not limited to vaginal bleeding, contractions, leaking of fluid and fetal movement were reviewed in detail with the patient. Please refer to After Visit Summary for other counseling recommendations.   Return in about 1 week (around 01/20/2024) for OB visits as scheduled. (if needed)  Future Appointments  Date Time Provider Department Center  01/13/2024 10:55 AM Vadis Slabach, Jethro Bastos, MD [facility]  01/20/2024 10:55 AM Jameela Michna, Jethro Bastos, MD [facility]    Jaynie Collins, MD

## 2024-01-13 NOTE — Patient Instructions (Signed)

## 2024-01-15 ENCOUNTER — Telehealth (HOSPITAL_COMMUNITY): Payer: Self-pay | Admitting: *Deleted

## 2024-01-15 ENCOUNTER — Other Ambulatory Visit: Payer: Self-pay | Admitting: Obstetrics and Gynecology

## 2024-01-15 ENCOUNTER — Encounter (HOSPITAL_COMMUNITY): Payer: Self-pay | Admitting: *Deleted

## 2024-01-15 DIAGNOSIS — D696 Thrombocytopenia, unspecified: Secondary | ICD-10-CM

## 2024-01-15 NOTE — Telephone Encounter (Signed)
 Preadmission screen

## 2024-01-16 ENCOUNTER — Inpatient Hospital Stay (HOSPITAL_COMMUNITY)
Admission: AD | Admit: 2024-01-16 | Discharge: 2024-01-19 | DRG: 806 | Disposition: A | Discharge status: Home / Self Care | Payer: BC Managed Care – PPO | Attending: Family Medicine | Admitting: Family Medicine

## 2024-01-16 DIAGNOSIS — Z3480 Encounter for supervision of other normal pregnancy, unspecified trimester: Principal | ICD-10-CM

## 2024-01-16 DIAGNOSIS — O99214 Obesity complicating childbirth: Secondary | ICD-10-CM | POA: Diagnosis present

## 2024-01-16 DIAGNOSIS — Z2839 Other underimmunization status: Secondary | ICD-10-CM

## 2024-01-16 DIAGNOSIS — Z3A39 39 weeks gestation of pregnancy: Secondary | ICD-10-CM

## 2024-01-16 DIAGNOSIS — O99113 Other diseases of the blood and blood-forming organs and certain disorders involving the immune mechanism complicating pregnancy, third trimester: Secondary | ICD-10-CM

## 2024-01-16 DIAGNOSIS — O09899 Supervision of other high risk pregnancies, unspecified trimester: Secondary | ICD-10-CM

## 2024-01-16 DIAGNOSIS — O99119 Other diseases of the blood and blood-forming organs and certain disorders involving the immune mechanism complicating pregnancy, unspecified trimester: Secondary | ICD-10-CM | POA: Diagnosis present

## 2024-01-16 DIAGNOSIS — K219 Gastro-esophageal reflux disease without esophagitis: Secondary | ICD-10-CM | POA: Diagnosis present

## 2024-01-16 DIAGNOSIS — O4292 Full-term premature rupture of membranes, unspecified as to length of time between rupture and onset of labor: Principal | ICD-10-CM | POA: Diagnosis present

## 2024-01-16 DIAGNOSIS — O9912 Other diseases of the blood and blood-forming organs and certain disorders involving the immune mechanism complicating childbirth: Secondary | ICD-10-CM | POA: Diagnosis present

## 2024-01-16 DIAGNOSIS — Z8632 Personal history of gestational diabetes: Secondary | ICD-10-CM

## 2024-01-16 DIAGNOSIS — O9962 Diseases of the digestive system complicating childbirth: Secondary | ICD-10-CM | POA: Diagnosis present

## 2024-01-16 DIAGNOSIS — D696 Thrombocytopenia, unspecified: Secondary | ICD-10-CM | POA: Diagnosis present

## 2024-01-16 DIAGNOSIS — Z141 Cystic fibrosis carrier: Secondary | ICD-10-CM

## 2024-01-16 DIAGNOSIS — O9902 Anemia complicating childbirth: Secondary | ICD-10-CM | POA: Diagnosis present

## 2024-01-16 NOTE — MAU Note (Signed)
 Pt says SROM at registration  Was coming to MAU for UC's  3/10 [facility] VE- 3 cm- Tuesday  Denies HSV GBS-neg

## 2024-01-17 ENCOUNTER — Encounter (HOSPITAL_COMMUNITY): Payer: Self-pay | Admitting: Family Medicine

## 2024-01-17 ENCOUNTER — Inpatient Hospital Stay (HOSPITAL_COMMUNITY): Admitting: Anesthesiology

## 2024-01-17 DIAGNOSIS — O9902 Anemia complicating childbirth: Secondary | ICD-10-CM | POA: Diagnosis not present

## 2024-01-17 DIAGNOSIS — Z3A39 39 weeks gestation of pregnancy: Secondary | ICD-10-CM | POA: Diagnosis not present

## 2024-01-17 DIAGNOSIS — O4202 Full-term premature rupture of membranes, onset of labor within 24 hours of rupture: Secondary | ICD-10-CM | POA: Diagnosis not present

## 2024-01-17 DIAGNOSIS — O4292 Full-term premature rupture of membranes, unspecified as to length of time between rupture and onset of labor: Secondary | ICD-10-CM | POA: Diagnosis not present

## 2024-01-17 DIAGNOSIS — Z141 Cystic fibrosis carrier: Secondary | ICD-10-CM | POA: Diagnosis not present

## 2024-01-17 DIAGNOSIS — D696 Thrombocytopenia, unspecified: Secondary | ICD-10-CM | POA: Diagnosis not present

## 2024-01-17 DIAGNOSIS — O9912 Other diseases of the blood and blood-forming organs and certain disorders involving the immune mechanism complicating childbirth: Secondary | ICD-10-CM | POA: Diagnosis not present

## 2024-01-17 DIAGNOSIS — K219 Gastro-esophageal reflux disease without esophagitis: Secondary | ICD-10-CM | POA: Diagnosis not present

## 2024-01-17 DIAGNOSIS — Z8632 Personal history of gestational diabetes: Secondary | ICD-10-CM | POA: Diagnosis not present

## 2024-01-17 DIAGNOSIS — O99214 Obesity complicating childbirth: Secondary | ICD-10-CM | POA: Diagnosis not present

## 2024-01-17 DIAGNOSIS — O26893 Other specified pregnancy related conditions, third trimester: Secondary | ICD-10-CM | POA: Diagnosis not present

## 2024-01-17 DIAGNOSIS — O9962 Diseases of the digestive system complicating childbirth: Secondary | ICD-10-CM | POA: Diagnosis not present

## 2024-01-17 LAB — CBC
HCT: 37 % (ref 36.0–46.0)
HCT: 37.8 % (ref 36.0–46.0)
HCT: 37.5 % (ref 36.0–46.0)
Hemoglobin: 12.4 g/dL (ref 12.0–15.0)
Hemoglobin: 12.8 g/dL (ref 12.0–15.0)
Hemoglobin: 12.6 g/dL (ref 12.0–15.0)
MCH: 29.8 pg (ref 26.0–34.0)
MCH: 29.8 pg (ref 26.0–34.0)
MCH: 30.2 pg (ref 26.0–34.0)
MCHC: 33.5 g/dL (ref 30.0–36.0)
MCHC: 33.9 g/dL (ref 30.0–36.0)
MCHC: 33.6 g/dL (ref 30.0–36.0)
MCV: 88.9 fL (ref 80.0–100.0)
MCV: 88.1 fL (ref 80.0–100.0)
MCV: 89.9 fL (ref 80.0–100.0)
Platelets: 121 10*3/uL — ABNORMAL LOW (ref 150–400)
Platelets: 115 10*3/uL — ABNORMAL LOW (ref 150–400)
Platelets: 110 K/uL — ABNORMAL LOW (ref 150–400)
RBC: 4.16 MIL/uL (ref 3.87–5.11)
RBC: 4.29 MIL/uL (ref 3.87–5.11)
RBC: 4.17 MIL/uL (ref 3.87–5.11)
RDW: 13.8 % (ref 11.5–15.5)
RDW: 14 % (ref 11.5–15.5)
RDW: 13.7 % (ref 11.5–15.5)
WBC: 10.7 10*3/uL — ABNORMAL HIGH (ref 4.0–10.5)
WBC: 11.6 10*3/uL — ABNORMAL HIGH (ref 4.0–10.5)
WBC: 13.8 K/uL — ABNORMAL HIGH (ref 4.0–10.5)
nRBC: 0 % (ref 0.0–0.2)
nRBC: 0 % (ref 0.0–0.2)
nRBC: 0 % (ref 0.0–0.2)

## 2024-01-17 LAB — POCT FERN TEST: POCT Fern Test: POSITIVE

## 2024-01-17 LAB — TYPE AND SCREEN
ABO/RH(D): B POS
Antibody Screen: NEGATIVE

## 2024-01-17 LAB — RPR: RPR Ser Ql: NONREACTIVE

## 2024-01-17 MED ORDER — MISOPROSTOL 50MCG HALF TABLET
50.0000 ug | ORAL_TABLET | Freq: Once | ORAL | Status: AC
  Administered 2024-01-17: 50 ug via ORAL
  Filled 2024-01-17: qty 1

## 2024-01-17 MED ORDER — OXYTOCIN-SODIUM CHLORIDE 30-0.9 UT/500ML-% IV SOLN
2.5000 [IU]/h | INTRAVENOUS | Status: DC
  Administered 2024-01-17: 2.5 [IU]/h via INTRAVENOUS

## 2024-01-17 MED ORDER — EPHEDRINE 5 MG/ML INJ: 10.0000 mg | INTRAVENOUS | Status: DC | PRN

## 2024-01-17 MED ORDER — LACTATED RINGERS IV SOLN: 500.0000 mL | INTRAVENOUS | Status: DC | PRN

## 2024-01-17 MED ORDER — ONDANSETRON HCL 4 MG/2ML IJ SOLN: 4.0000 mg | Freq: Four times a day (QID) | INTRAMUSCULAR | Status: DC | PRN

## 2024-01-17 MED ORDER — FENTANYL-BUPIVACAINE-NACL 0.5-0.125-0.9 MG/250ML-% EP SOLN
12.0000 mL/h | EPIDURAL | Status: DC | PRN
  Administered 2024-01-17: 12 mL/h via EPIDURAL
  Filled 2024-01-17: qty 250

## 2024-01-17 MED ORDER — OXYTOCIN-SODIUM CHLORIDE 30-0.9 UT/500ML-% IV SOLN: 1.0000 m[IU]/min | INTRAVENOUS | Status: DC

## 2024-01-17 MED ORDER — OXYTOCIN-SODIUM CHLORIDE 30-0.9 UT/500ML-% IV SOLN
1.0000 m[IU]/min | INTRAVENOUS | Status: DC
  Administered 2024-01-17: 2 m[IU]/min via INTRAVENOUS
  Filled 2024-01-17: qty 500

## 2024-01-17 MED ORDER — ACETAMINOPHEN 325 MG PO TABS: 650.0000 mg | ORAL_TABLET | ORAL | Status: DC | PRN

## 2024-01-17 MED ORDER — LIDOCAINE HCL (PF) 1 % IJ SOLN
INTRAMUSCULAR | Status: DC | PRN
  Administered 2024-01-17 (×2): 5 mL via EPIDURAL

## 2024-01-17 MED ORDER — LACTATED RINGERS IV SOLN
500.0000 mL | Freq: Once | INTRAVENOUS | Status: AC
  Administered 2024-01-17: 500 mL via INTRAVENOUS

## 2024-01-17 MED ORDER — TERBUTALINE SULFATE 1 MG/ML IJ SOLN: 0.2500 mg | Freq: Once | INTRAMUSCULAR | Status: DC | PRN

## 2024-01-17 MED ORDER — SOD CITRATE-CITRIC ACID 500-334 MG/5ML PO SOLN: 30.0000 mL | ORAL | Status: DC | PRN

## 2024-01-17 MED ORDER — LIDOCAINE HCL (PF) 1 % IJ SOLN: 30.0000 mL | INTRAMUSCULAR | Status: DC | PRN

## 2024-01-17 MED ORDER — PHENYLEPHRINE 80 MCG/ML (10ML) SYRINGE FOR IV PUSH (FOR BLOOD PRESSURE SUPPORT)
80.0000 ug | PREFILLED_SYRINGE | INTRAVENOUS | Status: DC | PRN
  Filled 2024-01-17: qty 10

## 2024-01-17 MED ORDER — LACTATED RINGERS IV SOLN: INTRAVENOUS | Status: DC

## 2024-01-17 MED ORDER — OXYTOCIN BOLUS FROM INFUSION
333.0000 mL | Freq: Once | INTRAVENOUS | Status: AC
  Administered 2024-01-17: 333 mL via INTRAVENOUS

## 2024-01-17 MED ORDER — FENTANYL CITRATE (PF) 100 MCG/2ML IJ SOLN: 50.0000 ug | INTRAMUSCULAR | Status: DC | PRN

## 2024-01-17 MED ORDER — PHENYLEPHRINE 80 MCG/ML (10ML) SYRINGE FOR IV PUSH (FOR BLOOD PRESSURE SUPPORT): 80.0000 ug | PREFILLED_SYRINGE | INTRAVENOUS | Status: DC | PRN

## 2024-01-17 MED ORDER — DIPHENHYDRAMINE HCL 50 MG/ML IJ SOLN: 12.5000 mg | INTRAMUSCULAR | Status: DC | PRN

## 2024-01-17 NOTE — H&P (Signed)
 Pamela Doyle is a 28 y.o. female [phone] at [redacted]w[redacted]d presenting for ROM.  Hx significant for gestational thrombocytopenia with stable platelets at 119 at 38 weeks.   NURSING  PROVIDER  Office Location [facility] Dating by U/S at 7.4 wks  [facility] Model Traditional Anatomy U/S WNL  Initiated care at  [facility]  English              LAB RESULTS   Support Person FOB Genetics NIPS: LR female AFP:     Carrier Screen Horizon:   Rhogam  B/Positive/-- (08/22 1441) A1C/GTT Early: 5.2 Third trimester: Nml 2 hr GTT  Flu Vaccine Declined    TDaP Vaccine 12/09/2023 Blood Type B/Positive/-- (08/22 1441)  Covid Vaccine Declined Antibody Negative (08/22 1441)  RSV Vaccine Declined Rubella <0.90 (08/22 1441)  Feeding Plan Both RPR Non Reactive (08/22 1441)  Contraception Plans for a BTL (has BCBS) vs vasectomy HBsAg Negative (08/22 1441)  Circumcision Yes HIV Non Reactive (08/22 1441)  Pediatrician  [facility] HCVAb Non Reactive (08/22 1441)  Prenatal Classes No      Pap Diagnosis  Date Value Ref Range Status  01/23/2023   Final   - Negative for intraepithelial lesion or malignancy (NILM)    BTLConsent N/A, has BCBS GC/CT Initial:  Neg/Neg 36wks:  Neg/neg    GBS  Neg       DME Rx [ ]  BP cuff [ ]  Weight Scale Waterbirth  [ ]  Class [ ]  Consent [ ]  CNM visit  PHQ9 & GAD7 [  ] new OB [  ] 28 weeks  [  ] 36 weeks Induction  [ ]  Orders Entered [ ] Foley Y/N    OB History     Gravida  4   Para  2   Term  2   Preterm      AB  1   Living  2      SAB  1   IAB      Ectopic      Multiple  0   Live Births  2          Past Medical History:  Diagnosis Date   Anemia    Biological false positive RPR test 12/23/2019   Negative T. 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 She was adopted. Social History:  reports that she has never smoked. She has never used smokeless tobacco. She reports that she does not currently use alcohol. She reports that she does not currently use drugs.     Maternal Diabetes: No Genetic Screening: Normal Maternal Ultrasounds/Referrals: Normal Fetal Ultrasounds or other Referrals:  None Maternal Substance Abuse:  No Significant Maternal Medications:  None Significant Maternal Lab Results:  Group B Strep negative Number of Prenatal Visits:greater than 3 verified prenatal visits Maternal Vaccinations:declined Other Comments:  None  Review of Systems  Constitutional:  Negative for chills, fatigue and fever.  Eyes:  Negative for visual disturbance.  Respiratory:  Negative for shortness of breath.   Cardiovascular:  Negative for  chest pain.  Gastrointestinal:  Positive for abdominal pain. Negative for vomiting.  Genitourinary:  Positive for vaginal discharge. Negative for difficulty urinating, dysuria, flank pain, pelvic pain, vaginal bleeding and vaginal pain.  Neurological:  Negative for dizziness and headaches.  Psychiatric/Behavioral: Negative.     Maternal Medical History:  Reason for admission: Rupture of membranes and contractions.   Contractions: Onset was 3-5 hours ago.   Fetal activity: Perceived fetal activity is normal.   Prenatal complications: Thrombocytopenia.   Prenatal Complications - Diabetes: none.   Dilation: 3 Effacement (%): 50 Station: -2 Exam by:: N. Romeo Apple, RN Blood pressure 119/75, pulse 76, temperature 97.9 F (36.6 C), temperature source Oral, resp. rate 16, height 5\' 8"  (1.727 m), weight 105.1 kg, unknown if currently breastfeeding. Maternal Exam:  Uterine Assessment: Contraction strength is mild.  Contraction  frequency is regular.  Abdomen: Patient reports no abdominal tenderness. Fetal presentation: vertex Cervix: Cervix evaluated by digital exam.     Fetal Exam Fetal Monitor Review: Mode: ultrasound.   Baseline rate: 130.  Variability: moderate (6-25 bpm).   Pattern: accelerations present and no decelerations.   Fetal State Assessment: Category I - tracings are normal.   Physical Exam Vitals and nursing note reviewed.  Constitutional:      Appearance: She is well-developed.  Cardiovascular:     Rate and Rhythm: Normal rate.     Heart sounds: Normal heart sounds.  Pulmonary:     Effort: Pulmonary effort is normal.     Breath sounds: Normal breath sounds.  Abdominal:     Palpations: Abdomen is soft.  Musculoskeletal:        General: Normal range of motion.     Cervical back: Normal range of motion.  Skin:    General: Skin is warm and dry.  Neurological:     Mental Status: She is alert and oriented to person, place, and time.  Psychiatric:        Behavior: Behavior normal.        Thought Content: Thought content normal.        Judgment: Judgment normal.     Prenatal labs: ABO, Rh: --/--/PENDING (03/01 0105) Antibody: PENDING (03/01 0105) Rubella: <0.90 (08/22 1441) RPR: Non Reactive (11/26 0815)  HBsAg: Negative (08/22 1441)  HIV: Non Reactive (11/26 0815)  GBS: Negative/-- (02/11 1322)   Assessment/Plan: [id number]  at [redacted]w[redacted]d   PROM/early labor GBS neg  Admit to L&D Expectant management on admission, augment PRN Vasectomy for contracetion Plans to breast and formula feed.  Anticipate NSVD  Sharen Counter, CNM 1:51 AM

## 2024-01-17 NOTE — Progress Notes (Signed)
 LABOR PROGRESS NOTE  Patient Name: Pamela Doyle, female   DOB: Jan 29, 1996, 28 y.o.  MRN: [id number]  Patient feeling mild contractions after Cytotec. More so than previous. Cervix 4/50/-2. Questionable forebag. Attempted rupture after verbal consent obtained; no fluid. Discussed starting pit; she is amenable. Cat I.  Joanne Gavel, MD

## 2024-01-17 NOTE — Anesthesia Preprocedure Evaluation (Signed)
 Anesthesia Evaluation  Patient identified by MRN, date of birth, ID band Patient awake    Reviewed: Allergy & Precautions, Patient's Chart, lab work & pertinent test results  Airway Mallampati: II       Dental no notable dental hx.    Pulmonary neg pulmonary ROS   Pulmonary exam normal        Cardiovascular negative cardio ROS Normal cardiovascular exam Rhythm:Regular Rate:Normal     Neuro/Psych  PSYCHIATRIC DISORDERS Anxiety     negative neurological ROS     GI/Hepatic Neg liver ROS,GERD  ,,  Endo/Other  diabetes, Gestational  Obesity  Renal/GU negative Renal ROS  negative genitourinary   Musculoskeletal negative musculoskeletal ROS (+)    Abdominal  (+) + obese  Peds  Hematology  (+) Blood dyscrasia, anemia   Anesthesia Other Findings   Reproductive/Obstetrics (+) Pregnancy                             Anesthesia Physical Anesthesia Plan  ASA: 2  Anesthesia Plan:    Post-op Pain Management:    Induction:   PONV Risk Score and Plan:   Airway Management Planned: Natural Airway  Additional Equipment: None and Fetal Monitoring  Intra-op Plan:   Post-operative Plan:   Informed Consent: I have reviewed the patients History and Physical, chart, labs and discussed the procedure including the risks, benefits and alternatives for the proposed anesthesia with the patient or authorized representative who has indicated his/her understanding and acceptance.       Plan Discussed with: Anesthesiologist  Anesthesia Plan Comments:        Anesthesia Quick Evaluation

## 2024-01-17 NOTE — Progress Notes (Signed)
 Pt scanned with u/s Infant vertex at 0855

## 2024-01-17 NOTE — Anesthesia Procedure Notes (Signed)
 Epidural Patient location during procedure: OB Start time: 01/17/2024 4:14 PM End time: 01/17/2024 4:23 PM  Staffing Anesthesiologist: Mal Amabile, MD Performed: anesthesiologist   Preanesthetic Checklist Completed: patient identified, IV checked, site marked, risks and benefits discussed, surgical consent, monitors and equipment checked, pre-op evaluation and timeout performed  Epidural Patient position: sitting Prep: DuraPrep and site prepped and draped Patient monitoring: continuous pulse ox and blood pressure Approach: midline Location: L3-L4 Injection technique: LOR air  Needle:  Needle type: Tuohy  Needle gauge: 17 G Needle length: 9 cm and 9 Needle insertion depth: 6 cm Catheter type: closed end flexible Catheter size: 19 Gauge Catheter at skin depth: 11 cm Test dose: negative and Other  Assessment Events: blood not aspirated, no cerebrospinal fluid, injection not painful, no injection resistance, no paresthesia and negative IV test  Additional Notes Patient identified. Risks and benefits discussed including failed block, incomplete  Pain control, post dural puncture headache, nerve damage, paralysis, blood pressure Changes, nausea, vomiting, reactions to medications-both toxic and allergic and post Partum back pain. All questions were answered. Patient expressed understanding and wished to proceed. Sterile technique was used throughout procedure. Epidural site was Dressed with sterile barrier dressing. No paresthesias, signs of intravascular injection Or signs of intrathecal spread were encountered.  Patient was more comfortable after the epidural was dosed. Please see RN's note for documentation of vital signs and FHR which are stable. Reason for block:procedure for pain

## 2024-01-17 NOTE — Lactation Note (Signed)
 This note was copied from a baby's chart. Lactation Consultation Note  Patient Name: Boy Anniemae Haberkorn [id number] Date: 01/17/2024 Age:28 hours  Per RN Karma Ganja) in L&D, MOB declined latch assistance in L&D and on MOB tonight but would like to be seen by [facility] services in the morning.    Maternal Data    Feeding    LATCH Score Latch: Grasps breast easily, tongue down, lips flanged, rhythmical sucking.  Audible Swallowing: None  Type of Nipple: Everted at rest and after stimulation  Comfort (Breast/Nipple): Soft / non-tender  Hold (Positioning): Assistance needed to correctly position infant at breast and maintain latch.  LATCH Score: 7   Lactation Tools Discussed/Used    Interventions    Discharge    Consult Status      Frederico Hamman 01/17/2024, 11:24 PM

## 2024-01-17 NOTE — Progress Notes (Signed)
 Pt complete, would like to wait for photographer to arrive. They have been called but live an hour away.   Blood pressure (!) 111/57, pulse 68, temperature 97.8 F (36.6 C), temperature source Oral, resp. rate 17, height 5\' 8"  (1.727 m), weight 105.1 kg, SpO2 98%, unknown if currently breastfeeding.  EFM: baseline 125, accels, no decels, moderate variability TOCO: q1-29min contractions  Passive decent until pt feels urge to push.   Wyn Forster, MD FMOB Fellow, Faculty practice [facility], Center for [facility]

## 2024-01-18 ENCOUNTER — Inpatient Hospital Stay (HOSPITAL_COMMUNITY): Admission: AD | Admit: 2024-01-18 | Payer: BC Managed Care – PPO | Source: Home / Self Care

## 2024-01-18 ENCOUNTER — Encounter (HOSPITAL_COMMUNITY): Payer: Self-pay | Admitting: Obstetrics and Gynecology

## 2024-01-18 ENCOUNTER — Inpatient Hospital Stay (HOSPITAL_COMMUNITY): Payer: BC Managed Care – PPO

## 2024-01-18 LAB — CBC
HCT: 34.8 % — ABNORMAL LOW (ref 36.0–46.0)
Hemoglobin: 11.6 g/dL — ABNORMAL LOW (ref 12.0–15.0)
MCH: 29.7 pg (ref 26.0–34.0)
MCHC: 33.3 g/dL (ref 30.0–36.0)
MCV: 89 fL (ref 80.0–100.0)
Platelets: 114 10*3/uL — ABNORMAL LOW (ref 150–400)
RBC: 3.91 MIL/uL (ref 3.87–5.11)
RDW: 13.8 % (ref 11.5–15.5)
WBC: 12.1 10*3/uL — ABNORMAL HIGH (ref 4.0–10.5)
nRBC: 0 % (ref 0.0–0.2)

## 2024-01-18 LAB — BIRTH TISSUE RECOVERY COLLECTION (PLACENTA DONATION)

## 2024-01-18 MED ORDER — BENZOCAINE-MENTHOL 20-0.5 % EX AERO
1.0000 | INHALATION_SPRAY | CUTANEOUS | Status: DC | PRN
  Administered 2024-01-18: 1 via TOPICAL
  Filled 2024-01-18: qty 56

## 2024-01-18 MED ORDER — ACETAMINOPHEN 325 MG PO TABS: 650.0000 mg | ORAL_TABLET | ORAL | Status: DC | PRN

## 2024-01-18 MED ORDER — IBUPROFEN 600 MG PO TABS
600.0000 mg | ORAL_TABLET | Freq: Four times a day (QID) | ORAL | Status: DC
  Administered 2024-01-18 – 2024-01-19 (×5): 600 mg via ORAL
  Filled 2024-01-18 (×5): qty 1

## 2024-01-18 MED ORDER — SODIUM CHLORIDE 0.9 % IV SOLN: 250.0000 mL | INTRAVENOUS | Status: DC | PRN

## 2024-01-18 MED ORDER — ONDANSETRON HCL 4 MG PO TABS: 4.0000 mg | ORAL_TABLET | ORAL | Status: DC | PRN

## 2024-01-18 MED ORDER — SODIUM CHLORIDE 0.9% FLUSH: 3.0000 mL | INTRAVENOUS | Status: DC | PRN

## 2024-01-18 MED ORDER — MEASLES, MUMPS & RUBELLA VAC IJ SOLR: 0.5000 mL | Freq: Once | INTRAMUSCULAR | Status: DC

## 2024-01-18 MED ORDER — SENNOSIDES-DOCUSATE SODIUM 8.6-50 MG PO TABS
2.0000 | ORAL_TABLET | ORAL | Status: DC
  Administered 2024-01-18: 2 via ORAL
  Filled 2024-01-18: qty 2

## 2024-01-18 MED ORDER — PRENATAL MULTIVITAMIN CH
1.0000 | ORAL_TABLET | Freq: Every day | ORAL | Status: DC
  Administered 2024-01-18: 1 via ORAL
  Filled 2024-01-18: qty 1

## 2024-01-18 MED ORDER — DIBUCAINE (PERIANAL) 1 % EX OINT: 1.0000 | TOPICAL_OINTMENT | CUTANEOUS | Status: DC | PRN

## 2024-01-18 MED ORDER — DIPHENHYDRAMINE HCL 25 MG PO CAPS: 25.0000 mg | ORAL_CAPSULE | Freq: Four times a day (QID) | ORAL | Status: DC | PRN

## 2024-01-18 MED ORDER — ZOLPIDEM TARTRATE 5 MG PO TABS: 5.0000 mg | ORAL_TABLET | Freq: Every evening | ORAL | Status: DC | PRN

## 2024-01-18 MED ORDER — COCONUT OIL OIL: 1.0000 | TOPICAL_OIL | Status: DC | PRN

## 2024-01-18 MED ORDER — ONDANSETRON HCL 4 MG/2ML IJ SOLN: 4.0000 mg | INTRAMUSCULAR | Status: DC | PRN

## 2024-01-18 MED ORDER — OXYCODONE HCL 5 MG PO TABS
5.0000 mg | ORAL_TABLET | ORAL | Status: DC | PRN
  Administered 2024-01-18 (×2): 5 mg via ORAL
  Filled 2024-01-18 (×3): qty 1

## 2024-01-18 MED ORDER — SODIUM CHLORIDE 0.9% FLUSH: 3.0000 mL | Freq: Two times a day (BID) | INTRAVENOUS | Status: DC

## 2024-01-18 MED ORDER — SIMETHICONE 80 MG PO CHEW: 80.0000 mg | CHEWABLE_TABLET | ORAL | Status: DC | PRN

## 2024-01-18 MED ORDER — FLUOXETINE HCL 20 MG PO CAPS
40.0000 mg | ORAL_CAPSULE | Freq: Every day | ORAL | Status: DC
  Administered 2024-01-18: 40 mg via ORAL
  Filled 2024-01-18: qty 2

## 2024-01-18 MED ORDER — TETANUS-DIPHTH-ACELL PERTUSSIS 5-2.5-18.5 LF-MCG/0.5 IM SUSY: 0.5000 mL | PREFILLED_SYRINGE | Freq: Once | INTRAMUSCULAR | Status: DC

## 2024-01-18 MED ORDER — WITCH HAZEL-GLYCERIN EX PADS: 1.0000 | MEDICATED_PAD | CUTANEOUS | Status: DC | PRN

## 2024-01-18 NOTE — Anesthesia Postprocedure Evaluation (Signed)
 Anesthesia Post Note  Patient: Pamela Doyle  Procedure(s) Performed: AN AD HOC LABOR EPIDURAL     Patient location during evaluation: Mother Baby Anesthesia Type: Epidural Level of consciousness: awake and alert Pain management: pain level controlled Vital Signs Assessment: post-procedure vital signs reviewed and stable Respiratory status: spontaneous breathing, nonlabored ventilation and respiratory function stable Cardiovascular status: stable Postop Assessment: no headache, no backache, epidural receding, no apparent nausea or vomiting, patient able to bend at knees, able to ambulate and adequate PO intake Anesthetic complications: no   No notable events documented.  Last Vitals:  Vitals:   01/18/24 0430 01/18/24 1025  BP: 116/65 117/72  Pulse: 72 71  Resp: 16   Temp:  36.7 C  SpO2: 97% 99%    Last Pain:  Vitals:   01/18/24 0440  TempSrc:   PainSc: 4    Pain Goal: Patients Stated Pain Goal: 7 (01/17/24 0800)                 Laban Emperor

## 2024-01-18 NOTE — Progress Notes (Signed)
 Post Partum Day 1 Subjective: no complaints, up ad lib, and voiding  Objective: Blood pressure 116/65, pulse 72, temperature 98.6 F (37 C), temperature source Oral, resp. rate 16, height 5\' 8"  (1.727 m), weight 105.1 kg, SpO2 97%, unknown if currently breastfeeding.  Physical Exam:  General: alert, cooperative, and appears stated age Lochia: appropriate Uterine Fundus: firm DVT Evaluation: No evidence of DVT seen on physical exam.  Recent Labs    01/17/24 2312 01/18/24 0518  HGB 12.6 11.6*  HCT 37.5 34.8*    Assessment/Plan: Plan for discharge tomorrow, Breastfeeding, Circumcision prior to discharge, and Contraception vasectomy   LOS: 1 day   Reva Bores, MD 01/18/2024, 7:27 AM

## 2024-01-18 NOTE — Lactation Note (Signed)
 This note was copied from a baby's chart. Lactation Consultation Note  Patient Name: Pamela Doyle [id number] Date: 01/18/2024 Age:29 hours Reason for consult: Initial assessment  P3- MOB informed LC that she has decided to formula feed only. LC reviewed engorgement/breast care in case her milk still comes in. [facility] provided MOB with an [facility] services handout and encouraged her to call if she needs help with engorgement post discharge.  Feeding Mother's Current Feeding Choice: Formula  Consult Status Consult Status: Complete Date: 01/18/24    Dema Severin BS, IBCLC 01/18/2024, 5:09 PM

## 2024-01-19 MED ORDER — IBUPROFEN 600 MG PO TABS: 600.0000 mg | ORAL_TABLET | Freq: Four times a day (QID) | ORAL | 0 refills | Status: DC

## 2024-01-19 MED ORDER — PRENATAL MULTIVITAMIN CH: 1.0000 | ORAL_TABLET | Freq: Every day | ORAL | 0 refills | Status: AC

## 2024-01-19 MED ORDER — SENNOSIDES-DOCUSATE SODIUM 8.6-50 MG PO TABS: 2.0000 | ORAL_TABLET | ORAL | 0 refills | Status: AC

## 2024-01-19 NOTE — Discharge Summary (Signed)
 Postpartum Discharge Summary       Patient Name: Pamela Doyle DOB: 10-11-1996 MRN: [id number]  Date of admission: 01/16/2024 Delivery date:01/17/2024 Delivering provider: Wyn Forster Date of discharge: 01/19/2024  Admitting diagnosis: Gestational thrombocytopenia (HCC) [O99.119, D69.6] Intrauterine pregnancy: [redacted]w[redacted]d     Secondary diagnosis:  Active Problems:   NSVD (normal spontaneous vaginal delivery)   Gestational thrombocytopenia (HCC)  Additional problems: none    Discharge diagnosis: Term Pregnancy Delivered                                              Post partum procedures: none Augmentation: AROM and Pitocin Complications: None  Hospital course: Onset of Labor With Vaginal Delivery      28 y.o. yo [id number] at [redacted]w[redacted]d was admitted in Latent Labor on 01/16/2024. Labor course was complicated by PROM.  Membrane Rupture Time/Date: 11:36 PM,01/16/2024  Delivery Method:Vaginal, Spontaneous Operative Delivery:N/A Episiotomy: None Lacerations:  None Patient had a postpartum course complicated by none.  She is ambulating, tolerating a regular diet, passing flatus, and urinating well. Patient is discharged home in stable condition on 01/19/24.  Newborn Data: Birth date:01/17/2024 Birth time:10:12 PM Gender:Female Living status:Living Apgars:8 ,9  Weight:3720 g  Magnesium Sulfate received: No BMZ received: No Rhophylac:N/A MMR:No T-DaP:Given prenatally Flu: Declined RSV Vaccine received: Declined Transfusion:No  Immunizations received: Immunization History  Administered Date(s) Administered   DTaP 02/20/1996, 06/18/1996, 09/17/1996, 04/05/1997, 10/02/2000   HIB (PRP-OMP) 02/20/1996, 06/18/1996, 09/17/1996, 04/05/1997   Hepatitis B, ADULT 12/20/1995, 01/21/1996, 09/17/1996   IPV 02/20/1996, 06/18/1996, 04/05/1997, 10/02/2000   Influenza,inj,Quad PF,6+ Mos 11/27/2017   MMR 04/05/1997, 10/02/2000   Tdap 11/27/2017, 04/12/2020, 04/11/2021, 12/09/2023   Varicella  08/04/2001, 07/15/2003    Physical exam  Vitals:   01/18/24 1025 01/18/24 1426 01/18/24 2308 01/19/24 0520  BP: 117/72 119/76 119/70 110/64  Pulse: 71 77 70 70  Resp: 16 16 18 16   Temp: 98 F (36.7 C) 98 F (36.7 C)    TempSrc: Oral Oral    SpO2: 99% 98% 98% 97%  Weight:      Height:       General: alert, cooperative, and no distress Lochia: appropriate Uterine Fundus: firm Incision: N/A DVT Evaluation: No evidence of DVT seen on physical exam. Labs: Lab Results  Component Value Date   WBC 12.1 (H) 01/18/2024   HGB 11.6 (L) 01/18/2024   HCT 34.8 (L) 01/18/2024   MCV 89.0 01/18/2024   PLT 114 (L) 01/18/2024      Latest Ref Rng & Units 06/20/2021    5:31 AM  CMP  Glucose 70 - 99 mg/dL 91    Edinburgh Score:    01/18/2024   11:08 PM  Edinburgh Postnatal Depression Scale Screening Tool  I have been able to laugh and see the funny side of things. 0  I have looked forward with enjoyment to things. 0  I have blamed myself unnecessarily when things went wrong. 0  I have been anxious or worried for no good reason. 0  I have felt scared or panicky for no good reason. 0  Things have been getting on top of me. 0  I have been so unhappy that I have had difficulty sleeping. 0  I have felt sad or miserable. 0  I have been so unhappy that I have been crying. 0  The thought of harming myself has  occurred to me. 0  Edinburgh Postnatal Depression Scale Total 0   Edinburgh Postnatal Depression Scale Total: 0   After visit meds:  Allergies as of 01/19/2024   No Known Allergies      Medication List     STOP taking these medications    ascorbic acid 500 MG tablet Commonly known as: VITAMIN C   cefadroxil 500 MG capsule Commonly known as: DURICEF   cyanocobalamin 100 MCG tablet Commonly known as: VITAMIN B12   docusate sodium 50 MG capsule Commonly known as: COLACE   ferrous sulfate 325 (65 FE) MG EC tablet   FLUoxetine 40 MG capsule Commonly known as: PROzac        TAKE these medications    ibuprofen 600 MG tablet Commonly known as: ADVIL Take 1 tablet (600 mg total) by mouth every 6 (six) hours.   prenatal multivitamin Tabs tablet Take 1 tablet by mouth daily at 12 noon.   senna-docusate 8.6-50 MG tablet Commonly known as: Senokot-S Take 2 tablets by mouth daily.         Discharge home in stable condition Infant Feeding: Bottle and Breast Infant Disposition:home with mother Discharge instruction: per After Visit Summary and Postpartum booklet. Activity: Advance as tolerated. Pelvic rest for 6 weeks.  Diet: routine diet Future Appointments: Future Appointments  Date Time Provider Department Center  01/26/2024  1:50 PM [facility] NURSE [facility]  03/02/2024 10:55 AM Anyanwu, Jethro Bastos, MD [facility]   Follow up Visit:  Follow-up Information     [facility] for [facility] at [facility]. Schedule an appointment as soon as possible for a visit.   Specialty: Obstetrics and Gynecology Why: Postpartum check at 4-6 weeks Contact information: [address] [place] [address] [phone]               Message sent to [facility] 3/2  Please schedule this patient for a In person postpartum visit in 4 weeks with the following provider: Any provider. Additional Postpartum F/U:Postpartum Depression checkup  Low risk pregnancy complicated by:  gestational thrombocytopenia Delivery mode:  Vaginal, Spontaneous Anticipated Birth Control:   Plans for a BTL (has [employer]) vs vasectomy   01/19/2024 Richardson Landry, CNM

## 2024-01-20 ENCOUNTER — Encounter: Payer: BC Managed Care – PPO | Admitting: Obstetrics & Gynecology

## 2024-01-23 ENCOUNTER — Other Ambulatory Visit: Payer: Self-pay | Admitting: *Deleted

## 2024-01-23 MED ORDER — DICLOXACILLIN SODIUM 500 MG PO CAPS: 500.0000 mg | ORAL_CAPSULE | Freq: Four times a day (QID) | ORAL | 0 refills | Status: AC

## 2024-01-23 MED ORDER — IBUPROFEN 600 MG PO TABS: 600.0000 mg | ORAL_TABLET | Freq: Four times a day (QID) | ORAL | 3 refills | Status: DC | PRN

## 2024-01-26 ENCOUNTER — Ambulatory Visit

## 2024-01-26 NOTE — Progress Notes (Signed)
 Pamela Doyle  [phone] Telephone call  for postpartum depression screen. Pt is currently 1 weeks postpartum. Pt reports doing well.   Edinburgh Postnatal Depression Scale - 01/26/24 1412       Edinburgh Postnatal Depression Scale:  In the Past 7 Days   I have been able to laugh and see the funny side of things. 0    I have looked forward with enjoyment to things. 0    I have blamed myself unnecessarily when things went wrong. 0    I have been anxious or worried for no good reason. 0    I have felt scared or panicky for no good reason. 0    Things have been getting on top of me. 0    I have been so unhappy that I have had difficulty sleeping. 0    I have felt sad or miserable. 0    I have been so unhappy that I have been crying. 0    The thought of harming myself has occurred to me. 0    Edinburgh Postnatal Depression Scale Total 0               Discussed with provider results of Edinburgh 0.  Pt to follow up Post Partum Visit.

## 2024-03-02 ENCOUNTER — Encounter: Payer: Self-pay | Admitting: Obstetrics & Gynecology

## 2024-03-02 ENCOUNTER — Telehealth (INDEPENDENT_AMBULATORY_CARE_PROVIDER_SITE_OTHER): Admitting: Obstetrics & Gynecology

## 2024-03-02 DIAGNOSIS — O09899 Supervision of other high risk pregnancies, unspecified trimester: Secondary | ICD-10-CM

## 2024-03-02 NOTE — Progress Notes (Signed)
   Provider location: Center for [facility] at [facility] [employer]   Patient location: Home  I connected with Shirl Dose on 03/02/24 at 10:55 AM EDT by Mychart Video Encounter and verified that I am speaking with the correct person using two identifiers.       I discussed the limitations, risks, security and privacy concerns of performing an evaluation and management service virtually and the availability of in person appointments. I also discussed with the patient that there may be a patient responsible charge related to this service. The patient expressed understanding and agreed to proceed.  Post Partum Virtual Visit Note Subjective:  Pamela Doyle is a 28 y.o. [phone] female who presents for a postpartum visit. She is 7 weeks postpartum following a normal spontaneous vaginal delivery.  I have fully reviewed the prenatal and intrapartum course. The delivery was at [redacted]w[redacted]d gestational weeks.  Anesthesia: none. Postpartum course has been well. Baby is doing well. Baby is feeding by bottle - enfamil neuropro. Bleeding no bleeding. Bowel function is normal. Bladder function is normal. Patient is sexually active. Contraception method is condoms, planning vasectomy. Postpartum depression screening: negative.  The pregnancy intention screening data noted above was reviewed. Potential methods of contraception were discussed. The patient elected to proceed with condoms for now but plannign vasectomy.  The following portions of the patient's history were reviewed and updated as appropriate: allergies, current medications, past family history, past medical history, past social history, past surgical history, and problem list.  Review of Systems Pertinent items noted in HPI and remainder of comprehensive ROS otherwise negative.  Objective:  LMP  (LMP Unknown)     General:  Alert, oriented and cooperative. Patient is in no acute distress.  Respiratory: Normal respiratory effort, no problems with  respiration noted  Mental Status: Normal mood and affect. Normal behavior. Normal judgment and thought content.  Rest of physical exam deferred due to type of encounter   Assessment:    Normal  postpartum exam.  Plan:  Essential components of care per ACOG recommendations:  1.  Mood and well being: Patient with negative depression screening today. Reviewed local resources for support.  - Patient does not use tobacco - hx of drug use? No    2. Infant care and feeding:  -Patient currently breastmilk feeding? No  -Social determinants of health (SDOH) reviewed in EPIC. No concerns.  3. Sexuality, contraception and birth spacing - Patient does not want a pregnancy in the next year.  Desired family size is 3 children.  - Reviewed forms of contraception in tiered fashion. Patient desired condoms, vasectomy today.    4. Sleep and fatigue -Encouraged family/partner/community support of 4 hrs of uninterrupted sleep to help with mood and fatigue  5. Physical Recovery  - Discussed patients delivery - Patient had no laceration, perineal healing reviewed. Patient expressed understanding - Patient has urinary incontinence? No - Patient is not safe to resume physical and sexual activity  6.  Health Maintenance - Last pap smear done 01/23/2023 and was normal with negative HPV.   I provided 8 minutes of face-to-face time during this encounter.   Lenoard Rad, MD Center for [employer], [facility]

## 2024-03-02 NOTE — Addendum Note (Signed)
 Addended by: Lenoard Rad A on: 03/02/2024 11:36 AM   Modules accepted: Level of Service

## 2024-03-15 ENCOUNTER — Encounter: Payer: Self-pay | Admitting: *Deleted

## 2024-03-15 ENCOUNTER — Telehealth: Payer: Self-pay

## 2024-03-15 NOTE — Telephone Encounter (Signed)
 Left message for pt to call office back regarding mychart message sent

## 2024-05-03 ENCOUNTER — Telehealth: Admitting: Family Medicine

## 2024-05-03 DIAGNOSIS — Z6841 Body Mass Index (BMI) 40.0 and over, adult: Secondary | ICD-10-CM | POA: Diagnosis not present

## 2024-05-03 DIAGNOSIS — E66813 Obesity, class 3: Secondary | ICD-10-CM | POA: Diagnosis not present

## 2024-05-03 DIAGNOSIS — F411 Generalized anxiety disorder: Secondary | ICD-10-CM

## 2024-05-03 MED ORDER — IBUPROFEN 600 MG PO TABS: 600.0000 mg | ORAL_TABLET | Freq: Four times a day (QID) | ORAL | 3 refills | Status: AC | PRN

## 2024-05-03 MED ORDER — HYDROXYZINE HCL 25 MG PO TABS: 25.0000 mg | ORAL_TABLET | Freq: Four times a day (QID) | ORAL | 2 refills | Status: AC | PRN

## 2024-05-03 NOTE — Progress Notes (Unsigned)
   Mood check ENCOUNTER NOTE  Subjective:   Charlyn Vialpando is a 28 y.o. [phone] female here for a mood check   Patient reports current sx - overstimulation with kids -angry and irritable - had PPD at 7 months with first child Mostly rage sx Stopped prozac  Switched to prozac  Off prozac  for about 1 month  Anixety-- weight Biggest she had ever been hard to get out of bed some days Doesn;t feel like she needs something everyday Couple times per week  Feels better about choices after stopping prozac   Celexa  - caused tooth grinding Friend gave her xanax  Patient currently breastfeeding? Yes Have they been on prior SSRI/SNRI? Yes  29 minutes      12/23/2023   10:59 AM 10/14/2023    8:18 AM 07/10/2023    2:20 PM  GAD 7 : Generalized Anxiety Score  Nervous, Anxious, on Edge 0 0 1  Control/stop worrying 0 0 1  Worry too much - different things 0 0 1  Trouble relaxing 0 0 1  Restless 0 0 1  Easily annoyed or irritable 0 0 1  Afraid - awful might happen 0 0 1  Total GAD 7 Score 0 0 7  Anxiety Difficulty Not difficult at all Not difficult at all        Gynecologic History No LMP recorded. Contraception: Condoms with vasectomy in future  Health Maintenance Due  Topic Date Due   HPV VACCINES (1 - 3-dose series) Never done     The following portions of the patient's history were reviewed and updated as appropriate: allergies, current medications, past family history, past medical history, past social history, past surgical history and problem list.  Review of Systems Pertinent items are noted in HPI.   Objective:  There were no vitals taken for this visit.  There is no height or weight on file to calculate BMI.  Gen: well appearing, NAD HEENT: no scleral icterus CV: RR Lung: Normal WOB Ext: warm well perfused  Pysch: WNL   Assessment and Plan:  1. Generalized anxiety disorder (Primary) - hydrOXYzine  (ATARAX ) 25 MG tablet; Take 1 tablet (25 mg total) by mouth  every 6 (six) hours as needed for anxiety.  Dispense: 30 tablet; Refill: 2  2. Class 3 severe obesity due to excess calories without serious comorbidity with body mass index (BMI) of 40.0 to 44.9 in adult - Amb Ref to Medical Weight Management   Please refer to After Visit Summary for other counseling recommendations.   No follow-ups on file.  Suzen Maryan Masters, MD, MPH, ABFM Attending Physician Faculty Practice- Center for [facility]

## 2024-05-10 ENCOUNTER — Encounter: Payer: Self-pay | Admitting: Family Medicine

## 2024-05-11 ENCOUNTER — Ambulatory Visit: Admitting: Family Medicine

## 2024-05-19 ENCOUNTER — Ambulatory Visit (INDEPENDENT_AMBULATORY_CARE_PROVIDER_SITE_OTHER): Admitting: Adult Health

## 2024-05-19 ENCOUNTER — Encounter (INDEPENDENT_AMBULATORY_CARE_PROVIDER_SITE_OTHER): Payer: Self-pay | Admitting: Adult Health

## 2024-05-19 VITALS — BP 130/84 | HR 86 | Temp 98.0°F | Ht 67.0 in | Wt 217.0 lb

## 2024-05-19 DIAGNOSIS — Z8632 Personal history of gestational diabetes: Secondary | ICD-10-CM

## 2024-05-19 DIAGNOSIS — F411 Generalized anxiety disorder: Secondary | ICD-10-CM

## 2024-05-19 DIAGNOSIS — Z0289 Encounter for other administrative examinations: Secondary | ICD-10-CM

## 2024-05-19 DIAGNOSIS — Z6833 Body mass index (BMI) 33.0-33.9, adult: Secondary | ICD-10-CM | POA: Diagnosis not present

## 2024-05-19 DIAGNOSIS — E669 Obesity, unspecified: Secondary | ICD-10-CM | POA: Diagnosis not present

## 2024-05-19 NOTE — Progress Notes (Signed)
 Office: [phone]  /  Fax: [phone]   Initial Visit    Pamela Doyle was seen in clinic today to evaluate for obesity. She is interested in losing weight to improve overall health and reduce the risk of weight related complications. She presents today to review program treatment options, initial physical assessment, and evaluation.     She was referred by: Specialist  When asked what else they would like to accomplish? She states: Adopt a healthier eating pattern and lifestyle, Improve energy levels and physical activity, Improve quality of life, and Current weight 217 lbs, Goal Weight 170-180 lbs  When asked how has your weight affected you? She states: Relationships, Having fatigue, Having poor endurance, Problems with eating patterns, and Has affected mood   Weight history: Prepregnancy weight 185 lbs.  Weight gain since 3 pregnancies (sons- ages 93,3 and 4 months).  Highest weight: 217 LBS  Some associated conditions: Other: Gestational Diabetes  Contributing factors: family history of obesity, disruption of circadian rhythm / sleep disordered breathing, consumption of processed foods, moderate to high levels of stress, reduced physical activity, hectic pace of life, and need for convenient foods  Weight promoting medications identified: None  Prior weight loss attempts: None  Current nutrition plan: Portion control / smart choices  Current level of physical activity: Walking 60 minutes, three a week  Current or previous pharmacotherapy: None  Response to medication: Never tried medications   Past medical history includes:   Past Medical History:  Diagnosis Date   Anemia    Biological false positive RPR test 12/23/2019   Negative T. 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  Body Fat%:41.0, Visceral Fat Rating:8, Weight trend over the last 12 months: Increasing  General:  Alert, oriented and cooperative. Patient is in no acute distress.  Respiratory: Normal respiratory effort, no problems with respiration noted   Gait: able to ambulate independently  Mental Status: Normal mood and affect. Normal behavior. Normal judgment and thought content.   DIAGNOSTIC DATA REVIEWED:  BMET    Component Value Date/Time   NA 134 (L) 06/18/2021 1957   NA 134 12/06/2020 1332   K 4.0 06/18/2021 1957   CL 107 06/18/2021 1957   CO2 19 (L) 06/18/2021 1957   GLUCOSE 91 06/20/2021 0531   BUN 7 06/18/2021 1957   BUN 7 12/06/2020 1332   CREATININE 0.67 06/18/2021 1957   CALCIUM 9.0 06/18/2021 1957   GFRNONAA >60 06/18/2021 1957   GFRAA 144 12/06/2020 1332   Lab Results  Component Value Date   HGBA1C 4.9 10/14/2023   HGBA1C 5.2 12/06/2020   No results found for: INSULIN CBC    Component Value Date/Time   WBC 12.1 (H) 01/18/2024 0518   RBC 3.91 01/18/2024 0518   HGB 11.6 (L) 01/18/2024 0518   HGB 12.5 01/06/2024 1105   HCT 34.8 (L) 01/18/2024 0518   HCT 37.4 01/06/2024 1105   PLT 114 (L) 01/18/2024 0518  PLT 119 (L) 01/06/2024 1105   MCV 89.0 01/18/2024 0518   MCV 89 01/06/2024 1105   MCH 29.7 01/18/2024 0518   MCHC 33.3 01/18/2024 0518   RDW 13.8 01/18/2024 0518   RDW 13.4 01/06/2024 1105   Iron/TIBC/Ferritin/ %Sat No results found for: IRON, TIBC, FERRITIN, IRONPCTSAT Lipid Panel  No results found for: CHOL, TRIG, HDL, CHOLHDL, VLDL, LDLCALC, LDLDIRECT Hepatic Function Panel     Component Value Date/Time   PROT 6.3 (L) 06/18/2021 1957   PROT 6.9  12/06/2020 1332   ALBUMIN 2.7 (L) 06/18/2021 1957   ALBUMIN 4.0 12/06/2020 1332   AST 15 06/18/2021 1957   ALT 9 06/18/2021 1957   ALKPHOS 150 (H) 06/18/2021 1957   BILITOT 0.7 06/18/2021 1957   BILITOT 0.2 12/06/2020 1332      Component Value Date/Time   TSH 2.090 12/06/2020 1332   Assessment and Plan   GAD (generalized anxiety disorder)  History of gestational diabetes  Obesity (BMI 30-39.9), BMI 34.0   Assessment and Plan          ESTABLISH WITH [facility]   Obesity Treatment / Action Plan:  Patient will work on garnering support from family and friends to begin weight loss journey. Will work on eliminating or reducing the presence of highly palatable, calorie dense foods in the home. Will complete provided nutritional and psychosocial assessment questionnaire before the next appointment. Will be scheduled for indirect calorimetry to determine resting energy expenditure in a fasting state.  This will allow us  to create a reduced calorie, high-protein meal plan to promote loss of fat mass while preserving muscle mass. Will think about ideas on how to incorporate physical activity into their daily routine. Counseled on the health benefits of losing 5%-15% of total body weight. Was counseled on nutritional approaches to weight loss and benefits of reducing processed foods and consuming plant-based foods and high quality protein as part of nutritional weight management. Was counseled on pharmacotherapy and role as an adjunct in weight management.   Obesity Education Performed Today:  She was weighed on the bioimpedance scale and results were discussed and documented in the synopsis.  We discussed obesity as a disease and the importance of a more detailed evaluation of all the factors contributing to the disease.  We discussed the importance of long term lifestyle changes which include nutrition, exercise and behavioral modifications as well as the importance of customizing this  to her specific health and social needs.  We discussed the benefits of reaching a healthier weight to alleviate the symptoms of existing conditions and reduce the risks of the biomechanical, metabolic and psychological effects of obesity.  We reviewed the four pillars of obesity medicine and importance of using a multimodal approach.  We reviewed the basic principles in weight management.   Pamela Doyle appears to be in the action stage of change and states they are ready to start intensive lifestyle modifications and behavioral modifications.  I have spent 25 minutes in the care of the patient today including: 3 minutes before the visit reviewing and preparing the chart. 20 minutes face-to-face assessing and reviewing listed medical problems as outlined in obesity care plan, providing nutritional and behavioral counseling on topics outlined in the obesity care plan, counseling regarding anti-obesity medication as outlined in obesity care plan, independently interpreting test results and goals of care, as described in assessment and plan, and reviewing and discussing biometric information and progress 2 minutes after the visit updating chart and documentation of encounter.  Reviewed by clinician on day of visit: allergies, medications, problem list, medical history, surgical history, family history, social history, and previous encounter notes pertinent to obesity diagnosis.  Emiliya Chretien d. Jolie Strohecker, NP-C

## 2024-06-04 ENCOUNTER — Other Ambulatory Visit: Payer: Self-pay | Admitting: Family Medicine

## 2024-06-04 DIAGNOSIS — F411 Generalized anxiety disorder: Secondary | ICD-10-CM

## 2024-06-07 ENCOUNTER — Ambulatory Visit: Admitting: Bariatrics

## 2024-06-09 DIAGNOSIS — M5432 Sciatica, left side: Secondary | ICD-10-CM | POA: Diagnosis not present

## 2024-06-09 DIAGNOSIS — M9903 Segmental and somatic dysfunction of lumbar region: Secondary | ICD-10-CM | POA: Diagnosis not present

## 2024-06-16 DIAGNOSIS — M9903 Segmental and somatic dysfunction of lumbar region: Secondary | ICD-10-CM | POA: Diagnosis not present

## 2024-06-16 DIAGNOSIS — M5432 Sciatica, left side: Secondary | ICD-10-CM | POA: Diagnosis not present

## 2024-06-21 ENCOUNTER — Ambulatory Visit: Admitting: Bariatrics

## 2024-06-21 DIAGNOSIS — M9903 Segmental and somatic dysfunction of lumbar region: Secondary | ICD-10-CM | POA: Diagnosis not present

## 2024-06-21 DIAGNOSIS — M5432 Sciatica, left side: Secondary | ICD-10-CM | POA: Diagnosis not present

## 2024-06-23 DIAGNOSIS — M5432 Sciatica, left side: Secondary | ICD-10-CM | POA: Diagnosis not present

## 2024-06-23 DIAGNOSIS — M9903 Segmental and somatic dysfunction of lumbar region: Secondary | ICD-10-CM | POA: Diagnosis not present

## 2024-06-28 DIAGNOSIS — Z13220 Encounter for screening for lipoid disorders: Secondary | ICD-10-CM | POA: Diagnosis not present

## 2024-06-28 DIAGNOSIS — R7309 Other abnormal glucose: Secondary | ICD-10-CM | POA: Diagnosis not present

## 2024-06-28 DIAGNOSIS — R635 Abnormal weight gain: Secondary | ICD-10-CM | POA: Diagnosis not present

## 2024-06-28 DIAGNOSIS — E66811 Obesity, class 1: Secondary | ICD-10-CM | POA: Diagnosis not present

## 2024-06-28 DIAGNOSIS — Z1331 Encounter for screening for depression: Secondary | ICD-10-CM | POA: Diagnosis not present

## 2024-06-28 DIAGNOSIS — E559 Vitamin D deficiency, unspecified: Secondary | ICD-10-CM | POA: Diagnosis not present

## 2024-06-28 DIAGNOSIS — R5383 Other fatigue: Secondary | ICD-10-CM | POA: Diagnosis not present

## 2024-06-28 DIAGNOSIS — E8889 Other specified metabolic disorders: Secondary | ICD-10-CM | POA: Diagnosis not present

## 2024-07-05 DIAGNOSIS — M5432 Sciatica, left side: Secondary | ICD-10-CM | POA: Diagnosis not present

## 2024-07-05 DIAGNOSIS — M9903 Segmental and somatic dysfunction of lumbar region: Secondary | ICD-10-CM | POA: Diagnosis not present

## 2024-07-07 DIAGNOSIS — M5432 Sciatica, left side: Secondary | ICD-10-CM | POA: Diagnosis not present

## 2024-07-07 DIAGNOSIS — M9903 Segmental and somatic dysfunction of lumbar region: Secondary | ICD-10-CM | POA: Diagnosis not present

## 2024-07-12 DIAGNOSIS — R7309 Other abnormal glucose: Secondary | ICD-10-CM | POA: Diagnosis not present

## 2024-07-12 DIAGNOSIS — R635 Abnormal weight gain: Secondary | ICD-10-CM | POA: Diagnosis not present

## 2024-07-12 DIAGNOSIS — R5383 Other fatigue: Secondary | ICD-10-CM | POA: Diagnosis not present

## 2024-07-12 DIAGNOSIS — E559 Vitamin D deficiency, unspecified: Secondary | ICD-10-CM | POA: Diagnosis not present

## 2024-07-22 DIAGNOSIS — R635 Abnormal weight gain: Secondary | ICD-10-CM | POA: Diagnosis not present

## 2024-07-22 DIAGNOSIS — E88819 Insulin resistance, unspecified: Secondary | ICD-10-CM | POA: Diagnosis not present

## 2024-07-22 DIAGNOSIS — E559 Vitamin D deficiency, unspecified: Secondary | ICD-10-CM | POA: Diagnosis not present

## 2024-07-22 DIAGNOSIS — R5383 Other fatigue: Secondary | ICD-10-CM | POA: Diagnosis not present

## 2024-08-02 ENCOUNTER — Ambulatory Visit (INDEPENDENT_AMBULATORY_CARE_PROVIDER_SITE_OTHER): Admitting: Family Medicine

## 2024-08-16 ENCOUNTER — Ambulatory Visit (INDEPENDENT_AMBULATORY_CARE_PROVIDER_SITE_OTHER): Admitting: Family Medicine

## 2024-08-30 DIAGNOSIS — E88819 Insulin resistance, unspecified: Secondary | ICD-10-CM | POA: Diagnosis not present

## 2024-08-30 DIAGNOSIS — R5383 Other fatigue: Secondary | ICD-10-CM | POA: Diagnosis not present

## 2024-08-30 DIAGNOSIS — R635 Abnormal weight gain: Secondary | ICD-10-CM | POA: Diagnosis not present

## 2024-08-30 DIAGNOSIS — E559 Vitamin D deficiency, unspecified: Secondary | ICD-10-CM | POA: Diagnosis not present

## 2024-10-13 DIAGNOSIS — Z6829 Body mass index (BMI) 29.0-29.9, adult: Secondary | ICD-10-CM | POA: Diagnosis not present

## 2024-10-13 DIAGNOSIS — E559 Vitamin D deficiency, unspecified: Secondary | ICD-10-CM | POA: Diagnosis not present

## 2024-10-13 DIAGNOSIS — E663 Overweight: Secondary | ICD-10-CM | POA: Diagnosis not present

## 2024-10-13 DIAGNOSIS — E88819 Insulin resistance, unspecified: Secondary | ICD-10-CM | POA: Diagnosis not present
